# Patient Record
Sex: Female | Born: 1935
Health system: Southern US, Community
[De-identification: ages and names within clinical notes are randomized; demographics above are authoritative.]

## PROBLEM LIST (undated history)

## (undated) DIAGNOSIS — J449 Chronic obstructive pulmonary disease, unspecified: Secondary | ICD-10-CM

## (undated) DIAGNOSIS — T7840XA Allergy, unspecified, initial encounter: Secondary | ICD-10-CM

## (undated) DIAGNOSIS — Z8701 Personal history of pneumonia (recurrent): Secondary | ICD-10-CM

## (undated) DIAGNOSIS — R05 Cough: Secondary | ICD-10-CM

## (undated) DIAGNOSIS — M1990 Unspecified osteoarthritis, unspecified site: Secondary | ICD-10-CM

## (undated) DIAGNOSIS — Z923 Personal history of irradiation: Secondary | ICD-10-CM

## (undated) DIAGNOSIS — N12 Tubulo-interstitial nephritis, not specified as acute or chronic: Secondary | ICD-10-CM

## (undated) DIAGNOSIS — F0390 Unspecified dementia without behavioral disturbance: Secondary | ICD-10-CM

## (undated) DIAGNOSIS — E785 Hyperlipidemia, unspecified: Secondary | ICD-10-CM

## (undated) DIAGNOSIS — C3490 Malignant neoplasm of unspecified part of unspecified bronchus or lung: Secondary | ICD-10-CM

## (undated) DIAGNOSIS — R059 Cough, unspecified: Secondary | ICD-10-CM

## (undated) DIAGNOSIS — R011 Cardiac murmur, unspecified: Secondary | ICD-10-CM

## (undated) HISTORY — PX: COLONOSCOPY: SHX174

## (undated) HISTORY — PX: DILATION AND CURETTAGE OF UTERUS: SHX78

## (undated) HISTORY — DX: Personal history of irradiation: Z92.3

## (undated) HISTORY — PX: CATARACT EXTRACTION: SUR2

## (undated) HISTORY — PX: TONSILLECTOMY: SUR1361

## (undated) HISTORY — DX: Allergy, unspecified, initial encounter: T78.40XA

## (undated) HISTORY — DX: Hyperlipidemia, unspecified: E78.5

## (undated) HISTORY — DX: Chronic obstructive pulmonary disease, unspecified: J44.9

---

## 1998-04-13 ENCOUNTER — Ambulatory Visit (HOSPITAL_COMMUNITY): Admission: RE | Admit: 1998-04-13 | Discharge: 1998-04-13 | Payer: Self-pay | Admitting: Specialist

## 1998-11-03 ENCOUNTER — Other Ambulatory Visit: Admission: RE | Admit: 1998-11-03 | Discharge: 1998-11-03 | Payer: Self-pay | Admitting: Internal Medicine

## 1999-08-09 ENCOUNTER — Encounter: Admission: RE | Admit: 1999-08-09 | Discharge: 1999-08-09 | Payer: Self-pay | Admitting: Internal Medicine

## 1999-08-09 ENCOUNTER — Encounter: Payer: Self-pay | Admitting: Internal Medicine

## 1999-12-20 ENCOUNTER — Other Ambulatory Visit: Admission: RE | Admit: 1999-12-20 | Discharge: 1999-12-20 | Payer: Self-pay | Admitting: Internal Medicine

## 2000-09-19 ENCOUNTER — Encounter: Admission: RE | Admit: 2000-09-19 | Discharge: 2000-09-19 | Payer: Self-pay | Admitting: Internal Medicine

## 2000-09-19 ENCOUNTER — Encounter: Payer: Self-pay | Admitting: Internal Medicine

## 2001-01-09 ENCOUNTER — Other Ambulatory Visit: Admission: RE | Admit: 2001-01-09 | Discharge: 2001-01-09 | Payer: Self-pay | Admitting: Internal Medicine

## 2001-10-24 ENCOUNTER — Encounter: Admission: RE | Admit: 2001-10-24 | Discharge: 2001-10-24 | Payer: Self-pay | Admitting: Internal Medicine

## 2001-10-24 ENCOUNTER — Encounter: Payer: Self-pay | Admitting: Internal Medicine

## 2002-03-04 ENCOUNTER — Other Ambulatory Visit: Admission: RE | Admit: 2002-03-04 | Discharge: 2002-03-04 | Payer: Self-pay | Admitting: Obstetrics and Gynecology

## 2002-11-11 ENCOUNTER — Encounter: Admission: RE | Admit: 2002-11-11 | Discharge: 2002-11-11 | Payer: Self-pay | Admitting: Internal Medicine

## 2002-11-11 ENCOUNTER — Encounter: Payer: Self-pay | Admitting: Internal Medicine

## 2003-11-16 ENCOUNTER — Encounter: Admission: RE | Admit: 2003-11-16 | Discharge: 2003-11-16 | Payer: Self-pay | Admitting: Internal Medicine

## 2009-02-02 ENCOUNTER — Encounter (INDEPENDENT_AMBULATORY_CARE_PROVIDER_SITE_OTHER): Payer: Self-pay | Admitting: *Deleted

## 2009-11-16 ENCOUNTER — Ambulatory Visit (HOSPITAL_BASED_OUTPATIENT_CLINIC_OR_DEPARTMENT_OTHER): Admission: RE | Admit: 2009-11-16 | Discharge: 2009-11-16 | Payer: Self-pay | Admitting: Orthopedic Surgery

## 2010-12-05 LAB — POCT HEMOGLOBIN-HEMACUE: Hemoglobin: 13.5 g/dL (ref 12.0–15.0)

## 2011-09-28 DIAGNOSIS — H40009 Preglaucoma, unspecified, unspecified eye: Secondary | ICD-10-CM | POA: Diagnosis not present

## 2011-09-28 DIAGNOSIS — H40039 Anatomical narrow angle, unspecified eye: Secondary | ICD-10-CM | POA: Diagnosis not present

## 2011-10-10 DIAGNOSIS — J309 Allergic rhinitis, unspecified: Secondary | ICD-10-CM | POA: Diagnosis not present

## 2011-10-24 DIAGNOSIS — L821 Other seborrheic keratosis: Secondary | ICD-10-CM | POA: Diagnosis not present

## 2011-10-24 DIAGNOSIS — L819 Disorder of pigmentation, unspecified: Secondary | ICD-10-CM | POA: Diagnosis not present

## 2011-10-24 DIAGNOSIS — L578 Other skin changes due to chronic exposure to nonionizing radiation: Secondary | ICD-10-CM | POA: Diagnosis not present

## 2011-11-17 DIAGNOSIS — J309 Allergic rhinitis, unspecified: Secondary | ICD-10-CM | POA: Diagnosis not present

## 2011-12-28 ENCOUNTER — Encounter: Payer: Self-pay | Admitting: Gastroenterology

## 2012-01-03 DIAGNOSIS — J309 Allergic rhinitis, unspecified: Secondary | ICD-10-CM | POA: Diagnosis not present

## 2012-01-15 ENCOUNTER — Encounter: Payer: Self-pay | Admitting: Gastroenterology

## 2012-02-09 DIAGNOSIS — J309 Allergic rhinitis, unspecified: Secondary | ICD-10-CM | POA: Diagnosis not present

## 2012-03-05 ENCOUNTER — Ambulatory Visit (AMBULATORY_SURGERY_CENTER): Payer: Medicare Other | Admitting: *Deleted

## 2012-03-05 VITALS — Ht 67.0 in | Wt 184.0 lb

## 2012-03-05 DIAGNOSIS — Z1211 Encounter for screening for malignant neoplasm of colon: Secondary | ICD-10-CM

## 2012-03-05 MED ORDER — MOVIPREP 100 G PO SOLR: ORAL | Status: DC

## 2012-03-26 ENCOUNTER — Encounter: Payer: Self-pay | Admitting: Gastroenterology

## 2012-03-26 ENCOUNTER — Ambulatory Visit (AMBULATORY_SURGERY_CENTER): Payer: Medicare Other | Admitting: Gastroenterology

## 2012-03-26 VITALS — BP 149/73 | HR 75 | Temp 96.6°F | Resp 20 | Ht 67.0 in | Wt 184.0 lb

## 2012-03-26 DIAGNOSIS — L851 Acquired keratosis [keratoderma] palmaris et plantaris: Secondary | ICD-10-CM | POA: Diagnosis not present

## 2012-03-26 DIAGNOSIS — Z1211 Encounter for screening for malignant neoplasm of colon: Secondary | ICD-10-CM

## 2012-03-26 DIAGNOSIS — D126 Benign neoplasm of colon, unspecified: Secondary | ICD-10-CM | POA: Diagnosis not present

## 2012-03-26 DIAGNOSIS — F411 Generalized anxiety disorder: Secondary | ICD-10-CM | POA: Diagnosis not present

## 2012-03-26 MED ORDER — SODIUM CHLORIDE 0.9 % IV SOLN: 500.0000 mL | INTRAVENOUS | Status: DC

## 2012-03-26 NOTE — Progress Notes (Signed)
Patient did not experience any of the following events: a burn prior to discharge; a fall within the facility; wrong site/side/patient/procedure/implant event; or a hospital transfer or hospital admission upon discharge from the facility. (G8907) Patient did not have preoperative order for IV antibiotic SSI prophylaxis. (G8918)  

## 2012-03-26 NOTE — Op Note (Signed)
[facility] [address] [place], [place]  [address]  COLONOSCOPY PROCEDURE REPORT  PATIENT:  Elizabeth, Humphrey  MR#:  [id number] BIRTHDATE:  Jun 03, 1934, 78 yrs. old  GENDER:  female ENDOSCOPIST:  Judie Petit T. Russella Dar, MD, [facility]  PROCEDURE DATE:  03/26/2012 PROCEDURE:  Colonoscopy with biopsy and snare polypectomy ASA CLASS:  Class II INDICATIONS:  1) Routine Risk Screening MEDICATIONS:   MAC sedation, administered by CRNA, propofol (Diprivan) 120 mg IV DESCRIPTION OF PROCEDURE:   After the risks benefits and alternatives of the procedure were thoroughly explained, informed consent was obtained.  Digital rectal exam was performed and revealed no abnormalities.   The LB CF-[id number] endoscope was introduced through the anus and advanced to the cecum, which was identified by both the appendix and ileocecal valve, without limitations.  The quality of the prep was good, using MoviPrep. The instrument was then slowly withdrawn as the colon was fully examined. <<PROCEDUREIMAGES>> FINDINGS:  Three polyps were found in the mid transverse colon. They were 4 - 7 mm in size. Polyps were snared without cautery. Retrieval was successful. snare polyp  Otherwise normal colonoscopy without other polyps, masses, vascular ectasias, or inflammatory changes.  Retroflexed views in the rectum revealed internal hemorrhoids  and a 1 cm hypertrophied anal papillae. Biopsies obtained and sent to path.  The time to cecum =  6.33 minutes. The scope was then withdrawn (time =  13.33  min) from the patient and the procedure completed.  COMPLICATIONS:  None  ENDOSCOPIC IMPRESSION: 1) Three polyps in the mid transverse colon 2) Internal hemorrhoids 3) Hypertrophied anal papillae  RECOMMENDATIONS: 1) Await pathology results 2) Given your age, you will not need another colonoscopy for colon cancer screening or polyp surveillance. These types of tests usually stop around the age 83.  Venita Lick. Russella Dar, MD, [facility]  CC:  Geoffry Paradise, MD  n. Rosalie DoctorVenita Lick. Maleni Seyer at 03/26/2012 09:03 AM  Beryle Quant, [phone]

## 2012-03-26 NOTE — Patient Instructions (Addendum)
YOU HAD AN ENDOSCOPIC PROCEDURE TODAY AT THE [facility]: Refer to the procedure report that was given to you for any specific questions about what was found during the examination.  If the procedure report does not answer your questions, please call your gastroenterologist to clarify.  If you requested that your care partner not be given the details of your procedure findings, then the procedure report has been included in a sealed envelope for you to review at your convenience later.  YOU SHOULD EXPECT: Some feelings of bloating in the abdomen. Passage of more gas than usual.  Walking can help get rid of the air that was put into your GI tract during the procedure and reduce the bloating. If you had a lower endoscopy (such as a colonoscopy or flexible sigmoidoscopy) you may notice spotting of blood in your stool or on the toilet paper. If you underwent a bowel prep for your procedure, then you may not have a normal bowel movement for a few days.  DIET: Your first meal following the procedure should be a light meal and then it is ok to progress to your normal diet.  A half-sandwich or bowl of soup is an example of a good first meal.  Heavy or fried foods are harder to digest and may make you feel nauseous or bloated.  Likewise meals heavy in dairy and vegetables can cause extra gas to form and this can also increase the bloating.  Drink plenty of fluids but you should avoid alcoholic beverages for 24 hours.  ACTIVITY: Your care partner should take you home directly after the procedure.  You should plan to take it easy, moving slowly for the rest of the day.  You can resume normal activity the day after the procedure however you should NOT DRIVE or use heavy machinery for 24 hours (because of the sedation medicines used during the test).    SYMPTOMS TO REPORT IMMEDIATELY: A gastroenterologist can be reached at any hour.  During normal business hours, 8:30 AM to 5:00 PM Monday through Friday,  call [phone].  After hours and on weekends, please call the GI answering service at [phone] who will take a message and have the physician on call contact you.   Following lower endoscopy (colonoscopy or flexible sigmoidoscopy):  Excessive amounts of blood in the stool  Significant tenderness or worsening of abdominal pains  Swelling of the abdomen that is new, acute  Fever of 100F or higher  Following upper endoscopy (EGD)  Vomiting of blood or coffee ground material  New chest pain or pain under the shoulder blades  Painful or persistently difficult swallowing  New shortness of breath  Fever of 100F or higher  Black, tarry-looking stools  FOLLOW UP: If any biopsies were taken you will be contacted by phone or by letter within the next 1-3 weeks.  Call your gastroenterologist if you have not heard about the biopsies in 3 weeks.  Our staff will call the home number listed on your records the next business day following your procedure to check on you and address any questions or concerns that you may have at that time regarding the information given to you following your procedure. This is a courtesy call and so if there is no answer at the home number and we have not heard from you through the emergency physician on call, we will assume that you have returned to your regular daily activities without incident.  SIGNATURES/CONFIDENTIALITY: You and/or your care   partner have signed paperwork which will be entered into your electronic medical record.  These signatures attest to the fact that that the information above on your After Visit Summary has been reviewed and is understood.  Full responsibility of the confidentiality of this discharge information lies with you and/or your care-partner.   Handout on polyps 

## 2012-03-27 ENCOUNTER — Telehealth: Payer: Self-pay | Admitting: *Deleted

## 2012-03-27 DIAGNOSIS — T63461A Toxic effect of venom of wasps, accidental (unintentional), initial encounter: Secondary | ICD-10-CM | POA: Diagnosis not present

## 2012-03-27 DIAGNOSIS — T6391XA Toxic effect of contact with unspecified venomous animal, accidental (unintentional), initial encounter: Secondary | ICD-10-CM | POA: Diagnosis not present

## 2012-03-27 NOTE — Telephone Encounter (Signed)
  Follow up Call-  Call back number 03/26/2012  Post procedure Call Back phone  # [phone]  Permission to leave phone message Yes     Patient questions:  Do you have a fever, pain , or abdominal swelling? no Pain Score  0 *  Have you tolerated food without any problems? yes  Have you been able to return to your normal activities? yes  Do you have any questions about your discharge instructions: Diet   no Medications  no Follow up visit  no  Do you have questions or concerns about your Care? no  Actions: * If pain score is 4 or above: No action needed, pain <4.

## 2012-04-01 ENCOUNTER — Encounter: Payer: Self-pay | Admitting: Gastroenterology

## 2012-04-23 DIAGNOSIS — L578 Other skin changes due to chronic exposure to nonionizing radiation: Secondary | ICD-10-CM | POA: Diagnosis not present

## 2012-04-23 DIAGNOSIS — L702 Acne varioliformis: Secondary | ICD-10-CM | POA: Diagnosis not present

## 2012-04-23 DIAGNOSIS — L821 Other seborrheic keratosis: Secondary | ICD-10-CM | POA: Diagnosis not present

## 2012-04-23 DIAGNOSIS — L570 Actinic keratosis: Secondary | ICD-10-CM | POA: Diagnosis not present

## 2012-04-29 DIAGNOSIS — J309 Allergic rhinitis, unspecified: Secondary | ICD-10-CM | POA: Diagnosis not present

## 2012-05-08 DIAGNOSIS — J309 Allergic rhinitis, unspecified: Secondary | ICD-10-CM | POA: Diagnosis not present

## 2012-05-23 DIAGNOSIS — H268 Other specified cataract: Secondary | ICD-10-CM | POA: Diagnosis not present

## 2012-05-23 DIAGNOSIS — H259 Unspecified age-related cataract: Secondary | ICD-10-CM | POA: Diagnosis not present

## 2012-05-23 DIAGNOSIS — H40249 Residual stage of angle-closure glaucoma, unspecified eye: Secondary | ICD-10-CM | POA: Diagnosis not present

## 2012-06-11 DIAGNOSIS — H25019 Cortical age-related cataract, unspecified eye: Secondary | ICD-10-CM | POA: Diagnosis not present

## 2012-06-11 DIAGNOSIS — H2510 Age-related nuclear cataract, unspecified eye: Secondary | ICD-10-CM | POA: Diagnosis not present

## 2012-06-11 DIAGNOSIS — H268 Other specified cataract: Secondary | ICD-10-CM | POA: Diagnosis not present

## 2012-06-11 DIAGNOSIS — H25049 Posterior subcapsular polar age-related cataract, unspecified eye: Secondary | ICD-10-CM | POA: Diagnosis not present

## 2012-06-11 DIAGNOSIS — Z9889 Other specified postprocedural states: Secondary | ICD-10-CM | POA: Diagnosis not present

## 2012-06-11 DIAGNOSIS — H5700 Unspecified anomaly of pupillary function: Secondary | ICD-10-CM | POA: Diagnosis not present

## 2012-06-18 DIAGNOSIS — R82998 Other abnormal findings in urine: Secondary | ICD-10-CM | POA: Diagnosis not present

## 2012-06-18 DIAGNOSIS — Z1212 Encounter for screening for malignant neoplasm of rectum: Secondary | ICD-10-CM | POA: Diagnosis not present

## 2012-06-18 DIAGNOSIS — E785 Hyperlipidemia, unspecified: Secondary | ICD-10-CM | POA: Diagnosis not present

## 2012-06-18 DIAGNOSIS — Z79899 Other long term (current) drug therapy: Secondary | ICD-10-CM | POA: Diagnosis not present

## 2012-06-18 DIAGNOSIS — R7301 Impaired fasting glucose: Secondary | ICD-10-CM | POA: Diagnosis not present

## 2012-06-20 DIAGNOSIS — J309 Allergic rhinitis, unspecified: Secondary | ICD-10-CM | POA: Diagnosis not present

## 2012-06-26 DIAGNOSIS — E785 Hyperlipidemia, unspecified: Secondary | ICD-10-CM | POA: Diagnosis not present

## 2012-06-26 DIAGNOSIS — Z0000 Encounter for general adult medical examination without abnormal findings: Secondary | ICD-10-CM | POA: Diagnosis not present

## 2012-06-26 DIAGNOSIS — Z23 Encounter for immunization: Secondary | ICD-10-CM | POA: Diagnosis not present

## 2012-06-26 DIAGNOSIS — Z1331 Encounter for screening for depression: Secondary | ICD-10-CM | POA: Diagnosis not present

## 2012-06-26 DIAGNOSIS — M4800 Spinal stenosis, site unspecified: Secondary | ICD-10-CM | POA: Diagnosis not present

## 2012-06-26 DIAGNOSIS — R7301 Impaired fasting glucose: Secondary | ICD-10-CM | POA: Diagnosis not present

## 2012-06-27 ENCOUNTER — Other Ambulatory Visit: Payer: Self-pay | Admitting: Internal Medicine

## 2012-06-27 DIAGNOSIS — R42 Dizziness and giddiness: Secondary | ICD-10-CM

## 2012-06-27 DIAGNOSIS — R112 Nausea with vomiting, unspecified: Secondary | ICD-10-CM | POA: Diagnosis not present

## 2012-06-27 DIAGNOSIS — R7301 Impaired fasting glucose: Secondary | ICD-10-CM | POA: Diagnosis not present

## 2012-06-27 DIAGNOSIS — J309 Allergic rhinitis, unspecified: Secondary | ICD-10-CM | POA: Diagnosis not present

## 2012-06-27 DIAGNOSIS — H8110 Benign paroxysmal vertigo, unspecified ear: Secondary | ICD-10-CM | POA: Diagnosis not present

## 2012-06-28 ENCOUNTER — Inpatient Hospital Stay: Admission: RE | Admit: 2012-06-28 | Payer: Medicare Other | Source: Ambulatory Visit

## 2012-06-28 DIAGNOSIS — Z1212 Encounter for screening for malignant neoplasm of rectum: Secondary | ICD-10-CM | POA: Diagnosis not present

## 2012-07-29 DIAGNOSIS — J309 Allergic rhinitis, unspecified: Secondary | ICD-10-CM | POA: Diagnosis not present

## 2012-08-20 DIAGNOSIS — H25049 Posterior subcapsular polar age-related cataract, unspecified eye: Secondary | ICD-10-CM | POA: Diagnosis not present

## 2012-08-20 DIAGNOSIS — H25019 Cortical age-related cataract, unspecified eye: Secondary | ICD-10-CM | POA: Diagnosis not present

## 2012-08-20 DIAGNOSIS — H2510 Age-related nuclear cataract, unspecified eye: Secondary | ICD-10-CM | POA: Diagnosis not present

## 2012-08-20 DIAGNOSIS — H269 Unspecified cataract: Secondary | ICD-10-CM | POA: Diagnosis not present

## 2012-09-12 DIAGNOSIS — J309 Allergic rhinitis, unspecified: Secondary | ICD-10-CM | POA: Diagnosis not present

## 2012-09-17 DIAGNOSIS — M4800 Spinal stenosis, site unspecified: Secondary | ICD-10-CM | POA: Diagnosis not present

## 2012-09-24 DIAGNOSIS — M999 Biomechanical lesion, unspecified: Secondary | ICD-10-CM | POA: Diagnosis not present

## 2012-09-24 DIAGNOSIS — M25559 Pain in unspecified hip: Secondary | ICD-10-CM | POA: Diagnosis not present

## 2012-09-24 DIAGNOSIS — M5137 Other intervertebral disc degeneration, lumbosacral region: Secondary | ICD-10-CM | POA: Diagnosis not present

## 2012-09-25 DIAGNOSIS — M25559 Pain in unspecified hip: Secondary | ICD-10-CM | POA: Diagnosis not present

## 2012-09-25 DIAGNOSIS — M999 Biomechanical lesion, unspecified: Secondary | ICD-10-CM | POA: Diagnosis not present

## 2012-09-25 DIAGNOSIS — M5137 Other intervertebral disc degeneration, lumbosacral region: Secondary | ICD-10-CM | POA: Diagnosis not present

## 2012-09-26 DIAGNOSIS — M999 Biomechanical lesion, unspecified: Secondary | ICD-10-CM | POA: Diagnosis not present

## 2012-09-26 DIAGNOSIS — M25559 Pain in unspecified hip: Secondary | ICD-10-CM | POA: Diagnosis not present

## 2012-09-26 DIAGNOSIS — M5137 Other intervertebral disc degeneration, lumbosacral region: Secondary | ICD-10-CM | POA: Diagnosis not present

## 2012-09-30 DIAGNOSIS — M999 Biomechanical lesion, unspecified: Secondary | ICD-10-CM | POA: Diagnosis not present

## 2012-09-30 DIAGNOSIS — M25559 Pain in unspecified hip: Secondary | ICD-10-CM | POA: Diagnosis not present

## 2012-09-30 DIAGNOSIS — M5137 Other intervertebral disc degeneration, lumbosacral region: Secondary | ICD-10-CM | POA: Diagnosis not present

## 2012-10-01 DIAGNOSIS — M25559 Pain in unspecified hip: Secondary | ICD-10-CM | POA: Diagnosis not present

## 2012-10-01 DIAGNOSIS — M999 Biomechanical lesion, unspecified: Secondary | ICD-10-CM | POA: Diagnosis not present

## 2012-10-01 DIAGNOSIS — M5137 Other intervertebral disc degeneration, lumbosacral region: Secondary | ICD-10-CM | POA: Diagnosis not present

## 2012-10-03 DIAGNOSIS — M25559 Pain in unspecified hip: Secondary | ICD-10-CM | POA: Diagnosis not present

## 2012-10-03 DIAGNOSIS — M999 Biomechanical lesion, unspecified: Secondary | ICD-10-CM | POA: Diagnosis not present

## 2012-10-03 DIAGNOSIS — M5137 Other intervertebral disc degeneration, lumbosacral region: Secondary | ICD-10-CM | POA: Diagnosis not present

## 2012-10-07 DIAGNOSIS — M5137 Other intervertebral disc degeneration, lumbosacral region: Secondary | ICD-10-CM | POA: Diagnosis not present

## 2012-10-07 DIAGNOSIS — M999 Biomechanical lesion, unspecified: Secondary | ICD-10-CM | POA: Diagnosis not present

## 2012-10-07 DIAGNOSIS — M25559 Pain in unspecified hip: Secondary | ICD-10-CM | POA: Diagnosis not present

## 2012-10-08 DIAGNOSIS — M5137 Other intervertebral disc degeneration, lumbosacral region: Secondary | ICD-10-CM | POA: Diagnosis not present

## 2012-10-08 DIAGNOSIS — M25559 Pain in unspecified hip: Secondary | ICD-10-CM | POA: Diagnosis not present

## 2012-10-08 DIAGNOSIS — M999 Biomechanical lesion, unspecified: Secondary | ICD-10-CM | POA: Diagnosis not present

## 2012-10-10 DIAGNOSIS — M999 Biomechanical lesion, unspecified: Secondary | ICD-10-CM | POA: Diagnosis not present

## 2012-10-10 DIAGNOSIS — M5137 Other intervertebral disc degeneration, lumbosacral region: Secondary | ICD-10-CM | POA: Diagnosis not present

## 2012-10-10 DIAGNOSIS — M25559 Pain in unspecified hip: Secondary | ICD-10-CM | POA: Diagnosis not present

## 2012-10-14 DIAGNOSIS — M999 Biomechanical lesion, unspecified: Secondary | ICD-10-CM | POA: Diagnosis not present

## 2012-10-14 DIAGNOSIS — M25559 Pain in unspecified hip: Secondary | ICD-10-CM | POA: Diagnosis not present

## 2012-10-14 DIAGNOSIS — M5137 Other intervertebral disc degeneration, lumbosacral region: Secondary | ICD-10-CM | POA: Diagnosis not present

## 2012-10-15 DIAGNOSIS — M25559 Pain in unspecified hip: Secondary | ICD-10-CM | POA: Diagnosis not present

## 2012-10-15 DIAGNOSIS — M999 Biomechanical lesion, unspecified: Secondary | ICD-10-CM | POA: Diagnosis not present

## 2012-10-15 DIAGNOSIS — M5137 Other intervertebral disc degeneration, lumbosacral region: Secondary | ICD-10-CM | POA: Diagnosis not present

## 2012-10-17 DIAGNOSIS — M25559 Pain in unspecified hip: Secondary | ICD-10-CM | POA: Diagnosis not present

## 2012-10-17 DIAGNOSIS — M5137 Other intervertebral disc degeneration, lumbosacral region: Secondary | ICD-10-CM | POA: Diagnosis not present

## 2012-10-17 DIAGNOSIS — M999 Biomechanical lesion, unspecified: Secondary | ICD-10-CM | POA: Diagnosis not present

## 2012-10-21 DIAGNOSIS — R82998 Other abnormal findings in urine: Secondary | ICD-10-CM | POA: Diagnosis not present

## 2012-10-21 DIAGNOSIS — R300 Dysuria: Secondary | ICD-10-CM | POA: Diagnosis not present

## 2012-10-22 DIAGNOSIS — T63461A Toxic effect of venom of wasps, accidental (unintentional), initial encounter: Secondary | ICD-10-CM | POA: Diagnosis not present

## 2012-10-22 DIAGNOSIS — T6391XA Toxic effect of contact with unspecified venomous animal, accidental (unintentional), initial encounter: Secondary | ICD-10-CM | POA: Diagnosis not present

## 2012-10-28 DIAGNOSIS — L819 Disorder of pigmentation, unspecified: Secondary | ICD-10-CM | POA: Diagnosis not present

## 2012-10-28 DIAGNOSIS — L578 Other skin changes due to chronic exposure to nonionizing radiation: Secondary | ICD-10-CM | POA: Diagnosis not present

## 2012-10-28 DIAGNOSIS — L821 Other seborrheic keratosis: Secondary | ICD-10-CM | POA: Diagnosis not present

## 2012-10-28 DIAGNOSIS — D1801 Hemangioma of skin and subcutaneous tissue: Secondary | ICD-10-CM | POA: Diagnosis not present

## 2012-10-28 DIAGNOSIS — L570 Actinic keratosis: Secondary | ICD-10-CM | POA: Diagnosis not present

## 2012-10-29 DIAGNOSIS — M25559 Pain in unspecified hip: Secondary | ICD-10-CM | POA: Diagnosis not present

## 2012-10-29 DIAGNOSIS — M999 Biomechanical lesion, unspecified: Secondary | ICD-10-CM | POA: Diagnosis not present

## 2012-10-29 DIAGNOSIS — M5137 Other intervertebral disc degeneration, lumbosacral region: Secondary | ICD-10-CM | POA: Diagnosis not present

## 2012-10-31 DIAGNOSIS — M999 Biomechanical lesion, unspecified: Secondary | ICD-10-CM | POA: Diagnosis not present

## 2012-10-31 DIAGNOSIS — M5137 Other intervertebral disc degeneration, lumbosacral region: Secondary | ICD-10-CM | POA: Diagnosis not present

## 2012-10-31 DIAGNOSIS — M25559 Pain in unspecified hip: Secondary | ICD-10-CM | POA: Diagnosis not present

## 2012-12-11 DIAGNOSIS — J309 Allergic rhinitis, unspecified: Secondary | ICD-10-CM | POA: Diagnosis not present

## 2013-01-22 DIAGNOSIS — T63461A Toxic effect of venom of wasps, accidental (unintentional), initial encounter: Secondary | ICD-10-CM | POA: Diagnosis not present

## 2013-01-22 DIAGNOSIS — T6391XA Toxic effect of contact with unspecified venomous animal, accidental (unintentional), initial encounter: Secondary | ICD-10-CM | POA: Diagnosis not present

## 2013-02-21 DIAGNOSIS — L820 Inflamed seborrheic keratosis: Secondary | ICD-10-CM | POA: Diagnosis not present

## 2013-02-21 DIAGNOSIS — L578 Other skin changes due to chronic exposure to nonionizing radiation: Secondary | ICD-10-CM | POA: Diagnosis not present

## 2013-02-21 DIAGNOSIS — Z85828 Personal history of other malignant neoplasm of skin: Secondary | ICD-10-CM | POA: Diagnosis not present

## 2013-02-21 DIAGNOSIS — Z85820 Personal history of malignant melanoma of skin: Secondary | ICD-10-CM | POA: Diagnosis not present

## 2013-02-21 DIAGNOSIS — L819 Disorder of pigmentation, unspecified: Secondary | ICD-10-CM | POA: Diagnosis not present

## 2013-03-05 DIAGNOSIS — T6391XA Toxic effect of contact with unspecified venomous animal, accidental (unintentional), initial encounter: Secondary | ICD-10-CM | POA: Diagnosis not present

## 2013-03-05 DIAGNOSIS — T63461A Toxic effect of venom of wasps, accidental (unintentional), initial encounter: Secondary | ICD-10-CM | POA: Diagnosis not present

## 2013-04-17 DIAGNOSIS — T6391XA Toxic effect of contact with unspecified venomous animal, accidental (unintentional), initial encounter: Secondary | ICD-10-CM | POA: Diagnosis not present

## 2013-04-17 DIAGNOSIS — T63461A Toxic effect of venom of wasps, accidental (unintentional), initial encounter: Secondary | ICD-10-CM | POA: Diagnosis not present

## 2013-05-27 DIAGNOSIS — J309 Allergic rhinitis, unspecified: Secondary | ICD-10-CM | POA: Diagnosis not present

## 2013-06-18 DIAGNOSIS — T6391XA Toxic effect of contact with unspecified venomous animal, accidental (unintentional), initial encounter: Secondary | ICD-10-CM | POA: Diagnosis not present

## 2013-06-18 DIAGNOSIS — J309 Allergic rhinitis, unspecified: Secondary | ICD-10-CM | POA: Diagnosis not present

## 2013-06-25 DIAGNOSIS — E785 Hyperlipidemia, unspecified: Secondary | ICD-10-CM | POA: Diagnosis not present

## 2013-06-25 DIAGNOSIS — R7301 Impaired fasting glucose: Secondary | ICD-10-CM | POA: Diagnosis not present

## 2013-06-25 DIAGNOSIS — R809 Proteinuria, unspecified: Secondary | ICD-10-CM | POA: Diagnosis not present

## 2013-07-02 DIAGNOSIS — M4800 Spinal stenosis, site unspecified: Secondary | ICD-10-CM | POA: Diagnosis not present

## 2013-07-02 DIAGNOSIS — M1990 Unspecified osteoarthritis, unspecified site: Secondary | ICD-10-CM | POA: Diagnosis not present

## 2013-07-02 DIAGNOSIS — E785 Hyperlipidemia, unspecified: Secondary | ICD-10-CM | POA: Diagnosis not present

## 2013-07-02 DIAGNOSIS — Z0000 Encounter for general adult medical examination without abnormal findings: Secondary | ICD-10-CM | POA: Diagnosis not present

## 2013-07-02 DIAGNOSIS — R7301 Impaired fasting glucose: Secondary | ICD-10-CM | POA: Diagnosis not present

## 2013-07-02 DIAGNOSIS — Z1331 Encounter for screening for depression: Secondary | ICD-10-CM | POA: Diagnosis not present

## 2013-07-02 DIAGNOSIS — J984 Other disorders of lung: Secondary | ICD-10-CM | POA: Diagnosis not present

## 2013-07-02 DIAGNOSIS — Z6830 Body mass index (BMI) 30.0-30.9, adult: Secondary | ICD-10-CM | POA: Diagnosis not present

## 2013-07-02 DIAGNOSIS — Z23 Encounter for immunization: Secondary | ICD-10-CM | POA: Diagnosis not present

## 2013-07-03 ENCOUNTER — Ambulatory Visit (INDEPENDENT_AMBULATORY_CARE_PROVIDER_SITE_OTHER): Payer: Medicare Other | Admitting: General Surgery

## 2013-07-03 ENCOUNTER — Encounter (INDEPENDENT_AMBULATORY_CARE_PROVIDER_SITE_OTHER): Payer: Self-pay

## 2013-07-03 ENCOUNTER — Encounter (INDEPENDENT_AMBULATORY_CARE_PROVIDER_SITE_OTHER): Payer: Self-pay | Admitting: General Surgery

## 2013-07-03 VITALS — BP 140/74 | HR 96 | Temp 98.8°F | Resp 16 | Ht 67.0 in | Wt 189.8 lb

## 2013-07-03 DIAGNOSIS — K645 Perianal venous thrombosis: Secondary | ICD-10-CM | POA: Diagnosis not present

## 2013-07-03 NOTE — Progress Notes (Signed)
Patient ID: Elizabeth Humphrey, female   DOB: 1934-08-17, 79 y.o.   MRN: [id number]  Chief Complaint  Patient presents with  . New Evaluation    eval hems    HPI Elizabeth Humphrey is a 79 y.o. female. This patient is referred by Dr. Jacky Kindle for evaluation of hemorrhoids. She has had a large hemorrhoid for about 40 years and she has just continually push this back in after each bowel movement. She says it really doesn't cause any symptoms but it bulges after each time she moves her bowels. She says it is not bleed and usually doesn't have any pain but it has been sore over the last week to she applies mupiricin without any relief. She does not take any fiber supplements and occasionally takes stool softeners but these do not provide any relief. She says that she normally moves her bowels about every other day and this has been normal for her. She denies a family history colon cancer. She had a colonoscopy last summer of by Dr. Russella Dar and he noted the hemorrhoids but she tells me this was otherwise negative. HPI  Past Medical History  Diagnosis Date  . Allergy     bee stings/anaphylaxis  . Hyperlipidemia     Past Surgical History  Procedure Laterality Date  . Dilation and curettage of uterus      Family History  Problem Relation Age of Onset  . Heart disease Mother   . Heart disease Father     Social History History  Substance Use Topics  . Smoking status: Former Smoker    Types: Cigarettes    Quit date: 07/03/2005  . Smokeless tobacco: Never Used  . Alcohol Use: 3.5 oz/week    7 drink(s) per week    Allergies  Allergen Reactions  . Neosporin [Neomycin-Bacitracin Zn-Polymyx] Rash    Current Outpatient Prescriptions  Medication Sig Dispense Refill  . aspirin 81 MG tablet Take 81 mg by mouth daily.      . mupirocin ointment (BACTROBAN) 2 % Apply 1 application topically as needed. For cuts and scrapes      . simvastatin (ZOCOR) 40 MG tablet Take 40 mg by mouth at bedtime.       . traMADol (ULTRAM) 50 MG tablet Take 1 tablet by mouth Twice daily.      [name] triamcinolone cream (KENALOG) 0.1 % Apply 1 application topically as needed. Poison and bug bites       No current facility-administered medications for this visit.    Review of Systems Review of Systems All other review of systems negative or noncontributory except as stated in the HPI  Blood pressure 140/74, pulse 96, temperature 98.8 F (37.1 C), temperature source Temporal, resp. rate 16, height 5\' 7"  (1.702 m), weight 189 lb 12.8 oz (86.093 kg).  Physical Exam Physical Exam Physical Exam  Nursing note and vitals reviewed. Constitutional: She is oriented to person, place, and time. She appears well-developed and well-nourished. No distress.  HENT:  Head: Normocephalic and atraumatic.  Mouth/Throat: No oropharyngeal exudate.  Eyes: Conjunctivae and EOM are normal. Pupils are equal, round, and reactive to light. Right eye exhibits no discharge. Left eye exhibits no discharge. No scleral icterus.  Neck: Normal range of motion. Neck supple. No tracheal deviation present.  Cardiovascular: Normal rate, regular rhythm, normal heart sounds and intact distal pulses.   Pulmonary/Chest: Effort normal and breath sounds normal. No stridor. No respiratory distress. She has no wheezes.  Abdominal: Soft. Bowel sounds are normal.  She exhibits no distension and no mass. There is no tenderness. There is no rebound and no guarding.  Rectal: large 2cm pedunculated hemorrhoid at left lateral column, reducible.  There is no evidence of bleeding or adenomatous changes.  She also has a portion of purple and thrombosed hemorrhoids near the base of this larger hemorrhoid. The right side is pretty normal appearing.  DRE is negative for internal masses. Musculoskeletal: Normal range of motion. She exhibits no edema and no tenderness.  Neurological: She is alert and oriented to person, place, and time.  Skin: Skin is warm and dry. No  rash noted. She is not diaphoretic. No erythema. No pallor.  Psychiatric: She has a normal mood and affect. Her behavior is normal. Judgment and thought content normal.    Data Reviewed cscope  Assessment    Hemorrhoids She has a large left lateral column hemorrhoid with some area of thrombosis associated with this. This is reducible manually and appears to be pretty asymptomatic. She says that this has been relatively unchanged for the last 40 years. I explained to her that this is likely a hemorrhoid but we could not completely rule out malignancy given the size without biopsy or excision.  I think that the chronicity of the problem would lean against malignancy since she says that this has been present for 40 years and mostly unchanged. She had a colonoscopy as well as which did not demonstrate any significant abnormalities other than this hemorrhoid.  I did discuss with her the option for hemorrhoidectomy for treatment and in order to rule out malignancy since this is such a large mass.  Again, I do not think that it is a cancer, but I did recommend excisional bx/hemorrhoid for treatment and to rule this out.  She would like to wait until after the New Year and in the meantime I recommended high fiber diet, 20-30 gms/day and increase water intake.  She will continue conservative treatment for now and if she would like to proceed with hemorrhoidectomy, she will call us back.    Plan    Increase fiber to 20-30gms/day Increase water intake Follow up PRN if excision desired         Elizabeth Humphrey DAVID 07/03/2013, 2:14 PM

## 2013-07-11 DIAGNOSIS — R222 Localized swelling, mass and lump, trunk: Secondary | ICD-10-CM | POA: Diagnosis not present

## 2013-07-17 ENCOUNTER — Other Ambulatory Visit (INDEPENDENT_AMBULATORY_CARE_PROVIDER_SITE_OTHER): Payer: Medicare Other

## 2013-07-17 ENCOUNTER — Encounter: Payer: Self-pay | Admitting: Pulmonary Disease

## 2013-07-17 ENCOUNTER — Ambulatory Visit (INDEPENDENT_AMBULATORY_CARE_PROVIDER_SITE_OTHER): Payer: Medicare Other | Admitting: Pulmonary Disease

## 2013-07-17 VITALS — BP 122/78 | HR 69 | Temp 97.9°F | Ht 66.5 in | Wt 191.0 lb

## 2013-07-17 DIAGNOSIS — R918 Other nonspecific abnormal finding of lung field: Secondary | ICD-10-CM

## 2013-07-17 DIAGNOSIS — R059 Cough, unspecified: Secondary | ICD-10-CM

## 2013-07-17 DIAGNOSIS — Z01811 Encounter for preprocedural respiratory examination: Secondary | ICD-10-CM

## 2013-07-17 DIAGNOSIS — R05 Cough: Secondary | ICD-10-CM | POA: Diagnosis not present

## 2013-07-17 DIAGNOSIS — R222 Localized swelling, mass and lump, trunk: Secondary | ICD-10-CM

## 2013-07-17 DIAGNOSIS — Z9189 Other specified personal risk factors, not elsewhere classified: Secondary | ICD-10-CM

## 2013-07-17 DIAGNOSIS — Z789 Other specified health status: Secondary | ICD-10-CM

## 2013-07-17 DIAGNOSIS — D499 Neoplasm of unspecified behavior of unspecified site: Secondary | ICD-10-CM

## 2013-07-17 LAB — CBC WITH DIFFERENTIAL/PLATELET
Basophils Relative: 0.5 % (ref 0.0–3.0)
Eosinophils Relative: 2.3 % (ref 0.0–5.0)
Lymphocytes Relative: 32 % (ref 12.0–46.0)
MCV: 93.1 fl (ref 78.0–100.0)
Monocytes Relative: 11.4 % (ref 3.0–12.0)
Neutrophils Relative %: 53.8 % (ref 43.0–77.0)
Platelets: 189 10*3/uL (ref 150.0–400.0)
RBC: 4.07 Mil/uL (ref 3.87–5.11)
WBC: 6.5 10*3/uL (ref 4.5–10.5)

## 2013-07-17 LAB — PROTIME-INR: INR: 1.1 ratio — ABNORMAL HIGH (ref 0.8–1.0)

## 2013-07-17 NOTE — Progress Notes (Signed)
Subjective:    Patient ID: Elizabeth Humphrey, female    DOB: 11/07/1933, 79 y.o.   MRN: [id number]  HPI This is a very pleasant 79 y/o female who came to our clnic today for evaluation of a lung mass. She recently had bronchitis which has been clearing up and she ended up getting a CXR either for evaluation of this or as part of a yearly physical.   The chest x-ray showed a nodule so a follow up CT scan was ordered. This showed a RLL lung mass with mediastinal lymphadenopathy. She tells me that she has been feeling well aside from the cough productive of clear sputum. In general this is slowly clearing up over the last several weeks. Her weight has been stable, she does not have chest pain, and she has not coughed up any blood. She has not had fevers or chills. She previously smoked one half pack of cigarettes daily for 50 years and quit approximately 9 years ago.   Past Medical History  Diagnosis Date  . Allergy     bee stings/anaphylaxis  . Hyperlipidemia      Family History  Problem Relation Age of Onset  . Heart disease Mother   . Heart disease Father      History   Social History  . Marital Status: Married    Spouse Name: N/A    Number of Children: N/A  . Years of Education: N/A   Occupational History  . Not on file.   Social History Main Topics  . Smoking status: Former Smoker -- 0.50 packs/day for 50 years    Types: Cigarettes    Quit date: 07/03/2005  . Smokeless tobacco: Never Used  . Alcohol Use: 3.5 oz/week    7 drink(s) per week  . Drug Use: No  . Sexual Activity: Not on file   Other Topics Concern  . Not on file   Social History Narrative  . No narrative on file     Allergies  Allergen Reactions  . Neosporin [Neomycin-Bacitracin Zn-Polymyx] Rash     Outpatient Prescriptions Prior to Visit  Medication Sig Dispense Refill  . aspirin 81 MG tablet Take 81 mg by mouth daily.      . mupirocin ointment (BACTROBAN) 2 % Apply 1 application topically as  needed. For cuts and scrapes      . simvastatin (ZOCOR) 40 MG tablet Take 40 mg by mouth at bedtime.      . traMADol (ULTRAM) 50 MG tablet Take 1 tablet by mouth Twice daily.      [name] triamcinolone cream (KENALOG) 0.1 % Apply 1 application topically as needed. Poison and bug bites       No facility-administered medications prior to visit.      Review of Systems  Constitutional: Negative for fever and chills.  HENT: Positive for congestion. Negative for nosebleeds, postnasal drip, rhinorrhea and sinus pressure.   Eyes: Negative for pain, redness and itching.  Respiratory: Positive for cough. Negative for choking, chest tightness, shortness of breath and wheezing.   Cardiovascular: Negative for chest pain.  Gastrointestinal: Negative for abdominal pain, diarrhea and constipation.  Endocrine: Negative for cold intolerance and heat intolerance.  Genitourinary: Negative for frequency and difficulty urinating.  Musculoskeletal: Positive for arthralgias and back pain.  Skin: Negative for color change and rash.  Allergic/Immunologic: Negative for immunocompromised state.  Neurological: Negative for dizziness, speech difficulty and headaches.  Hematological: Negative for adenopathy.  Psychiatric/Behavioral: Negative for behavioral problems, confusion, self-injury and dysphoric  mood. The patient is not nervous/anxious.        Objective:   Physical Exam  Filed Vitals:   07/17/13 1059  BP: 122/78  Pulse: 69  Temp: 97.9 F (36.6 C)  TempSrc: Oral  Height: 5' 6.5" (1.689 m)  Weight: 191 lb (86.637 kg)  SpO2: 97%  RA  Gen: well appearing, no acute distress HEENT: NCAT, PERRL, EOMi, OP clear, neck supple without masses, no palpable lymphadenopathy PULM: CTA B CV: RRR, systolic murmur noted, no JVD AB: BS+, soft, nontender, no hsm Ext: warm, no edema, no clubbing, no cyanosis Derm: no rash or skin breakdown Neuro: A&Ox4, CN II-XII intact, strength 5/5 in all 4 extremities  07/11/2013  CT chest, report is not available so this is my interpretation> There is a mass in the right lower lobe which appears to be 2.3 cm in size with associated right hilar and mediastinal lymphadenopathy. Most prominent is a #7 lymph node which is quite enlarged as well as a slightly enlarged 4R lymph node     Assessment & Plan:   Lung mass I am very concerned about the size of this lesion and the mediastinal lymphadenopathy.  Given her smoking history this is very concerning for bronchogenic carcinoma like adenocarcinoma or squamous cell carcinoma.  The ddx includes benign possibilities, but these are less likely.  I need to obtain the final report from the CT chest, as at this point I am only able to see the images.  Plan: -get full CT chest report -PET/CT to see if extrathoracic sites are FDG-avid and amenable to biopsy -plan EBUS guided FNA of subcarinal lymph node within the week -full PFT -CBC and PT/INR -f/u 4-6 weeks  Cough This appears to be resolving bronchitis.  We checked spirometry today to look for COPD but it was not acceptable by ATS criteria.  The flow volume loop was consistent with obstruction, but the FEV1 volume measured was greater than 80% predicted.  Plan -continue anti-tussive therapy -full PFT   Updated Medication List Outpatient Encounter Prescriptions as of 07/17/2013  Medication Sig  . aspirin 81 MG tablet Take 81 mg by mouth daily.  [name] HYDROcodone-homatropine (HYCODAN) 5-1.5 MG/5ML syrup Take 5 mLs by mouth 2 (two) times daily.  . mupirocin ointment (BACTROBAN) 2 % Apply 1 application topically as needed. For cuts and scrapes  . naproxen sodium (ANAPROX) 220 MG tablet Take 220 mg by mouth daily.  . simvastatin (ZOCOR) 40 MG tablet Take 40 mg by mouth at bedtime.  . traMADol (ULTRAM) 50 MG tablet Take 1 tablet by mouth Twice daily.  [name] triamcinolone cream (KENALOG) 0.1 % Apply 1 application topically as needed. Poison and bug bites

## 2013-07-17 NOTE — Assessment & Plan Note (Addendum)
I am very concerned about the size of this lesion and the mediastinal lymphadenopathy.  Given her smoking history this is very concerning for bronchogenic carcinoma like adenocarcinoma or squamous cell carcinoma.  The ddx includes benign possibilities, but these are less likely.  I need to obtain the final report from the CT chest, as at this point I am only able to see the images.  Plan: -get full CT chest report -PET/CT to see if extrathoracic sites are FDG-avid and amenable to biopsy -plan EBUS guided FNA of subcarinal lymph node within the week -full PFT -CBC and PT/INR -f/u 4-6 weeks

## 2013-07-17 NOTE — Assessment & Plan Note (Signed)
This appears to be resolving bronchitis.  We checked spirometry today to look for COPD but it was not acceptable by ATS criteria.  The flow volume loop was consistent with obstruction, but the FEV1 volume measured was greater than 80% predicted.  Plan -continue anti-tussive therapy -full PFT

## 2013-07-17 NOTE — Addendum Note (Signed)
Addended by: Max Fickle B on: 07/17/2013 12:16 PM   Modules accepted: Orders

## 2013-07-17 NOTE — Patient Instructions (Signed)
We will schedule you for a PET scan and a bronchoscopy for next week. We will call you within 24 hours to give you a schedule.  Go get labwork before you leave today (in the basement)  We will arrange lung function testing today  We will see you back in 4-6 weeks or sooner if neede

## 2013-07-18 ENCOUNTER — Encounter (HOSPITAL_COMMUNITY): Payer: Self-pay | Admitting: Pharmacy Technician

## 2013-07-18 ENCOUNTER — Encounter (HOSPITAL_COMMUNITY): Payer: Self-pay | Admitting: *Deleted

## 2013-07-18 ENCOUNTER — Ambulatory Visit (INDEPENDENT_AMBULATORY_CARE_PROVIDER_SITE_OTHER): Payer: Medicare Other | Admitting: Pulmonary Disease

## 2013-07-18 DIAGNOSIS — R05 Cough: Secondary | ICD-10-CM | POA: Diagnosis not present

## 2013-07-18 DIAGNOSIS — R222 Localized swelling, mass and lump, trunk: Secondary | ICD-10-CM | POA: Diagnosis not present

## 2013-07-18 DIAGNOSIS — R918 Other nonspecific abnormal finding of lung field: Secondary | ICD-10-CM

## 2013-07-18 DIAGNOSIS — R059 Cough, unspecified: Secondary | ICD-10-CM

## 2013-07-18 LAB — PULMONARY FUNCTION TEST
DLCO unc % pred: 65 %
DLCO unc: 17.68 ml/min/mmHg
FEF 25-75 Post: 0.82 L/sec
FEF2575-%Change-Post: 1 %
FEF2575-%Pred-Pre: 49 %
FEV6-%Change-Post: 3 %
FEV6-%Pred-Post: 92 %
FEV6-%Pred-Pre: 89 %
FEV6-Post: 2.59 L
FEV6FVC-%Change-Post: 0 %
FEV6FVC-%Pred-Post: 102 %
FVC-%Change-Post: 1 %
FVC-%Pred-Post: 89 %
FVC-%Pred-Pre: 88 %
FVC-Post: 2.64 L
Post FEV1/FVC ratio: 61 %
Pre FEV1/FVC ratio: 62 %
Pre FEV6/FVC Ratio: 97 %
RV % pred: 101 %

## 2013-07-18 NOTE — Progress Notes (Signed)
PFT done. By Jerolyn Shin, CMA

## 2013-07-24 ENCOUNTER — Encounter (HOSPITAL_COMMUNITY)
Admission: RE | Admit: 2013-07-24 | Discharge: 2013-07-24 | Disposition: A | Discharge status: Home / Self Care | Payer: Medicare Other | Source: Ambulatory Visit | Attending: Pulmonary Disease | Admitting: Pulmonary Disease

## 2013-07-24 DIAGNOSIS — R918 Other nonspecific abnormal finding of lung field: Secondary | ICD-10-CM

## 2013-07-24 DIAGNOSIS — I700 Atherosclerosis of aorta: Secondary | ICD-10-CM | POA: Insufficient documentation

## 2013-07-24 DIAGNOSIS — R222 Localized swelling, mass and lump, trunk: Secondary | ICD-10-CM | POA: Diagnosis not present

## 2013-07-24 DIAGNOSIS — R911 Solitary pulmonary nodule: Secondary | ICD-10-CM | POA: Insufficient documentation

## 2013-07-24 LAB — GLUCOSE, CAPILLARY: Glucose-Capillary: 112 mg/dL — ABNORMAL HIGH (ref 70–99)

## 2013-07-24 MED ORDER — FLUDEOXYGLUCOSE F - 18 (FDG) INJECTION
17.2000 | Freq: Once | INTRAVENOUS | Status: AC | PRN
  Administered 2013-07-24: 17.2 via INTRAVENOUS

## 2013-07-25 ENCOUNTER — Telehealth: Payer: Self-pay | Admitting: Pulmonary Disease

## 2013-07-25 NOTE — Telephone Encounter (Signed)
Called pt and reviewed PET with her, and the EBUS scheduled for Monday.  She understands risks of GA, bleeding, PTX, and agrees to proceed.

## 2013-07-27 ENCOUNTER — Encounter (HOSPITAL_COMMUNITY): Payer: Self-pay | Admitting: Anesthesiology

## 2013-07-27 NOTE — Anesthesia Preprocedure Evaluation (Deleted)
Anesthesia Evaluation  Patient identified by MRN, date of birth, ID band Patient awake    Reviewed: Allergy & Precautions, H&P , NPO status , Patient's Chart, lab work & pertinent test results  Airway       Dental   Pulmonary former smoker,  Lung CA         Cardiovascular negative cardio ROS      Neuro/Psych negative neurological ROS  negative psych ROS   GI/Hepatic negative GI ROS, Neg liver ROS,   Endo/Other  negative endocrine ROS  Renal/GU negative Renal ROS  negative genitourinary   Musculoskeletal negative musculoskeletal ROS (+)   Abdominal   Peds  Hematology negative hematology ROS (+)   Anesthesia Other Findings   Reproductive/Obstetrics                           Anesthesia Physical Anesthesia Plan  ASA: III  Anesthesia Plan: General   Post-op Pain Management:    Induction: Intravenous  Airway Management Planned: Oral ETT  Additional Equipment:   Intra-op Plan:   Post-operative Plan: Extubation in OR  Informed Consent: I have reviewed the patients History and Physical, chart, labs and discussed the procedure including the risks, benefits and alternatives for the proposed anesthesia with the patient or authorized representative who has indicated his/her understanding and acceptance.   Dental advisory given  Plan Discussed with: CRNA  Anesthesia Plan Comments:         Anesthesia Quick Evaluation

## 2013-07-28 ENCOUNTER — Telehealth: Payer: Self-pay | Admitting: Pulmonary Disease

## 2013-07-28 ENCOUNTER — Ambulatory Visit (HOSPITAL_COMMUNITY)
Admission: RE | Admit: 2013-07-28 | Discharge: 2013-07-28 | Disposition: A | Discharge status: Home / Self Care | Payer: Medicare Other | Source: Ambulatory Visit | Attending: Pulmonary Disease | Admitting: Pulmonary Disease

## 2013-07-28 ENCOUNTER — Encounter (HOSPITAL_COMMUNITY): Payer: Self-pay | Admitting: Pharmacy Technician

## 2013-07-28 ENCOUNTER — Encounter (HOSPITAL_COMMUNITY)
Admission: RE | Disposition: A | Discharge status: Home / Self Care | Payer: Self-pay | Source: Ambulatory Visit | Attending: Pulmonary Disease

## 2013-07-28 ENCOUNTER — Encounter: Payer: Self-pay | Admitting: Internal Medicine

## 2013-07-28 ENCOUNTER — Ambulatory Visit (HOSPITAL_COMMUNITY): Admission: RE | Admit: 2013-07-28 | Payer: Medicare Other | Source: Ambulatory Visit | Admitting: Pulmonary Disease

## 2013-07-28 HISTORY — DX: Malignant neoplasm of unspecified part of unspecified bronchus or lung: C34.90

## 2013-07-28 SURGERY — ENDOBRONCHIAL ULTRASOUND (EBUS)
Anesthesia: General | Laterality: Bilateral

## 2013-07-28 NOTE — Telephone Encounter (Signed)
Spoke to pt she is aware of her EBUS being set up@[facility]  on 08/06/13@8 :30am and was told to arrive at [facility]

## 2013-07-30 ENCOUNTER — Telehealth: Payer: Self-pay | Admitting: Pulmonary Disease

## 2013-07-30 ENCOUNTER — Institutional Professional Consult (permissible substitution) (INDEPENDENT_AMBULATORY_CARE_PROVIDER_SITE_OTHER): Payer: Medicare Other | Admitting: Thoracic Surgery (Cardiothoracic Vascular Surgery)

## 2013-07-30 ENCOUNTER — Other Ambulatory Visit: Payer: Self-pay | Admitting: *Deleted

## 2013-07-30 ENCOUNTER — Encounter: Payer: Self-pay | Admitting: Pulmonary Disease

## 2013-07-30 ENCOUNTER — Encounter: Payer: Self-pay | Admitting: Thoracic Surgery (Cardiothoracic Vascular Surgery)

## 2013-07-30 VITALS — BP 141/72 | HR 81 | Resp 18 | Ht 66.0 in | Wt 187.0 lb

## 2013-07-30 DIAGNOSIS — R599 Enlarged lymph nodes, unspecified: Secondary | ICD-10-CM | POA: Diagnosis not present

## 2013-07-30 DIAGNOSIS — R918 Other nonspecific abnormal finding of lung field: Secondary | ICD-10-CM

## 2013-07-30 DIAGNOSIS — J984 Other disorders of lung: Secondary | ICD-10-CM

## 2013-07-30 DIAGNOSIS — J449 Chronic obstructive pulmonary disease, unspecified: Secondary | ICD-10-CM | POA: Insufficient documentation

## 2013-07-30 DIAGNOSIS — R911 Solitary pulmonary nodule: Secondary | ICD-10-CM

## 2013-07-30 DIAGNOSIS — R590 Localized enlarged lymph nodes: Secondary | ICD-10-CM

## 2013-07-30 DIAGNOSIS — R222 Localized swelling, mass and lump, trunk: Secondary | ICD-10-CM | POA: Diagnosis not present

## 2013-07-30 HISTORY — DX: Chronic obstructive pulmonary disease, unspecified: J44.9

## 2013-07-30 MED ORDER — TIOTROPIUM BROMIDE MONOHYDRATE 18 MCG IN CAPS: 18.0000 ug | ORAL_CAPSULE | Freq: Every day | RESPIRATORY_TRACT | Status: DC

## 2013-07-30 NOTE — Progress Notes (Signed)
PCP is [name], MD Referring Provider is Perini, Redge Gainer, MD  Chief Complaint  Patient presents with  . Lung Mass    RML...eval and treat..CT CHEST 07/11/13.Marland KitchenMarland KitchenPET 07/24/13  . Adenopathy    MEDIASTINAL...HILAR  . Lung Lesion    LUL    HPI: 79 year old woman presents for evaluation of a right lung mass with hilar or mediastinal adenopathy.  Mrs. Barua is a 79 year old woman with a history of tobacco abuse (one half pack per day for 50 years) who recently had a right lung mass found on chest x-ray. She said that she been in her usual state of health. She went to Dr. Jacky Kindle for an annual physical. As part of her evaluation a chest x-ray was done which showed a right lung nodule. This led to a CT of the chest which showed a right middle lobe mass along with hilar and mediastinal adenopathy. She then had a PET/CT which showed these lesions to be hypermetabolic. There also was a small left upper lobe nodule that was hypermetabolic.  She was scheduled for an EBUS at [facility] last week. However there was some mixup in the scheduling in the procedure had to be canceled. She and her husband were understandably upset by this cancellation. They talk to Dr. Waynard Edwards and she was referred here for further evaluation.  She says that she was feeling well except for cough. She had been treated empirically for bronchitis, but the cough was slow to resolve. She has not had any fevers, chills or sweats. She has not had hemoptysis. Her appetite is good. She denies any weight loss. She says that she is very physically active and works out daily. She has stairs in her home and has no shortness of breath or chest pain with walking up and down stairs. She is very anxious about the x-ray findings.   Past Medical History  Diagnosis Date  . Allergy     bee stings/anaphylaxis  . Hyperlipidemia     hx of  . Lung cancer   . COPD (chronic obstructive pulmonary disease) 07/30/13    Past Surgical History   Procedure Laterality Date  . Dilation and curettage of uterus  yrs ago    Family History  Problem Relation Age of Onset  . Heart disease Mother   . Heart disease Father     Social History History  Substance Use Topics  . Smoking status: Former Smoker -- 0.50 packs/day for 50 years    Types: Cigarettes    Quit date: 07/03/2005  . Smokeless tobacco: Never Used  . Alcohol Use: 3.5 oz/week    7 drink(s) per week    Current Outpatient Prescriptions  Medication Sig Dispense Refill  . aspirin 81 MG tablet Take 81 mg by mouth daily.      [name] HYDROcodone-homatropine (HYCODAN) 5-1.5 MG/5ML syrup Take 5 mLs by mouth 2 (two) times daily.      . mupirocin ointment (BACTROBAN) 2 % Apply 1 application topically as needed. For cuts and scrapes      . naproxen sodium (ANAPROX) 220 MG tablet Take 220 mg by mouth daily.      . simvastatin (ZOCOR) 40 MG tablet Take 40 mg by mouth at bedtime.      [name] tiotropium (SPIRIVA) 18 MCG inhalation capsule Place 1 capsule (18 mcg total) into inhaler and inhale daily.  30 capsule  6  . traMADol (ULTRAM) 50 MG tablet Take 1 tablet by mouth daily.  No current facility-administered medications for this visit.    Allergies  Allergen Reactions  . Neosporin [Neomycin-Bacitracin Zn-Polymyx] Rash    Review of Systems  Constitutional: Negative for fever, chills, activity change, appetite change, fatigue and unexpected weight change.  Respiratory: Positive for cough. Negative for chest tightness, shortness of breath and wheezing.   Cardiovascular: Negative for chest pain and leg swelling.  Neurological: Negative.   All other systems reviewed and are negative.    BP 141/72  Pulse 81  Resp 18  Ht 5\' 6"  (1.676 m)  Wt 187 lb (84.823 kg)  BMI 30.20 kg/m2  SpO2 98% Physical Exam  Vitals reviewed. Constitutional: She is oriented to person, place, and time. No distress.  Obese  HENT:  Head: Normocephalic and atraumatic.  Eyes: EOM are normal. Pupils  are equal, round, and reactive to light.  Neck: Neck supple. No thyromegaly present.  Cardiovascular: Normal rate and regular rhythm.   Murmur (2/6 systolic murmur) heard. Pulmonary/Chest: Effort normal and breath sounds normal. She has no wheezes.  Abdominal: Soft. There is no tenderness.  Lymphadenopathy:    She has no cervical adenopathy.  Neurological: She is alert and oriented to person, place, and time. No cranial nerve deficit.  Skin: Skin is warm and dry.     Diagnostic Tests: PET/CT 07/24/2013 NUCLEAR MEDICINE PET SKULL BASE TO THIGH  FASTING BLOOD GLUCOSE: Value: 112mg /dl  TECHNIQUE:  69.6 mCi E-95 FDG was injected intravenously. CT data was obtained  and used for attenuation correction and anatomic localization only.  (This was not acquired as a diagnostic CT examination.) Additional  exam technical data entered on technologist worksheet.  COMPARISON: 07/11/2013  FINDINGS:  NECK  No hypermetabolic lymph nodes in the neck.  CHEST  Right middle lobe lung mass measures 3.5 cm and has an SUV max equal  to 26.7, image 106. Enlarged right hilar lymph node measures 3.1 cm  and has an SUV max equal to 18.2, image 95. Enlarged and  hypermetabolic right paratracheal and sub- carinal lymph nodes  identified. The sub- carinal lymph node measures 2.5 cm and has an  SUV max equal to 13.6, image 92. Within the left upper lobe medially  there is a tiny nodule measuring 4 mm. There is mild increased  uptake associated with this nodule within SUV max equal to 4.3.  ABDOMEN/PELVIS  No abnormal hypermetabolic activity within the liver, pancreas,  adrenal glands, or spleen. No hypermetabolic lymph nodes in the  abdomen or pelvis. There is an indeterminate filling defect within  the lower portion of the rectum. This measures 3.0 cm, image  233/series 2. Increased FDG uptake within this area is identified.  Calcified atherosclerotic disease affects the abdominal aorta.  SKELETON  No  focal hypermetabolic activity to suggest skeletal metastasis.  IMPRESSION:  1. Hypermetabolic right middle lobe mass consistent with primary  lung neoplasm.  2. Hypermetabolic if see lateral hilar and mediastinal lymph node  metastasis.  3. Sub cm nodule in the left upper lobe exhibits malignant range FDG  uptake.  4. Indeterminate filling defect within the lower rectum. Correlation  with colon cancer screening is advised.  5. Calcified atherosclerotic disease.  Electronically Signed  By: Signa Kell M.D.  On: 07/24/2013 15:01  Impression: 79 year old woman with a 3.5 cm right middle lobe mass with significant hilar and mediastinal adenopathy. There also was a very small mass in the left upper lobe (4 mm). All these lesions are hypermetabolic by PET. This picture is consistent with an  advanced stage lung cancer either stage III or IV. It is possible, but highly unlikely, but this would be anything other than a new lung cancer.  She needs a biopsy for diagnosis and staging to guide treatment.  I did discuss with the patient and her husband this did not appear to be something that could be surgically resectable, but would need to be treated with chemotherapy and probably radiation as well.  I recommended that we proceed with bronchoscopy, endobronchial ultrasound, and possible mediastinoscopy. I discussed the general nature of the procedure with them. We discussed the plan to do this in the operating room under general anesthesia. They understand and only resort to mediastinoscopy if we were unable to make the diagnosis with bronchoscopy or endobronchial ultrasound.  I discussed with them the indications, risks, benefits, and alternatives. They understand the risks include those associated with general anesthesia. They understand the risks include, but are not limited to bleeding, pneumothorax, recurrent nerve injury, failure to establish a diagnosis, and stroke. She accepts these risks and  wishes to proceed.  Plan:  Bronchoscopy, endobronchial ultrasound, possible mediastinoscopy on Monday, November 24.

## 2013-07-30 NOTE — Telephone Encounter (Signed)
ATC [T WCB

## 2013-07-30 NOTE — Telephone Encounter (Signed)
Spouse called back. He is aware of were procedure is. Samples left upfront for pick up and will come by after 1:30 to be shown how to use this. Nothing further needed

## 2013-07-30 NOTE — Addendum Note (Signed)
Addended by: Tommie Sams on: 07/30/2013 11:47 AM   Modules accepted: Orders

## 2013-07-30 NOTE — Telephone Encounter (Signed)
I tried to call Elizabeth Humphrey to talk about the scheduling mix up and to make sure she knew how to make it to her EBUS next week.  Unfortunately I could not get through with her mobile or home number.  I'm going to cc triage to try to call her later today to make sure she knows how to make it to her EBUS next week and to answer any questions.  Also, her PFT's showed COPD, so I am going to call in a prescription for spiriva to take once a day.    Triage, please offer to give her samples at our office of the Spiriva.

## 2013-07-31 ENCOUNTER — Encounter (HOSPITAL_COMMUNITY)
Admit: 2013-07-31 | Discharge: 2013-07-31 | Disposition: A | Discharge status: Home / Self Care | Payer: Medicare Other | Source: Ambulatory Visit | Attending: Thoracic Surgery (Cardiothoracic Vascular Surgery) | Admitting: Thoracic Surgery (Cardiothoracic Vascular Surgery)

## 2013-07-31 ENCOUNTER — Encounter (HOSPITAL_COMMUNITY): Payer: Self-pay

## 2013-07-31 VITALS — BP 136/58 | HR 85 | Temp 98.1°F | Resp 18 | Ht 66.0 in | Wt 188.1 lb

## 2013-07-31 DIAGNOSIS — Z01818 Encounter for other preprocedural examination: Secondary | ICD-10-CM | POA: Diagnosis not present

## 2013-07-31 DIAGNOSIS — Z01810 Encounter for preprocedural cardiovascular examination: Secondary | ICD-10-CM | POA: Diagnosis not present

## 2013-07-31 DIAGNOSIS — Z01812 Encounter for preprocedural laboratory examination: Secondary | ICD-10-CM | POA: Insufficient documentation

## 2013-07-31 DIAGNOSIS — R918 Other nonspecific abnormal finding of lung field: Secondary | ICD-10-CM

## 2013-07-31 DIAGNOSIS — R590 Localized enlarged lymph nodes: Secondary | ICD-10-CM

## 2013-07-31 HISTORY — DX: Cardiac murmur, unspecified: R01.1

## 2013-07-31 HISTORY — DX: Personal history of pneumonia (recurrent): Z87.01

## 2013-07-31 HISTORY — DX: Unspecified osteoarthritis, unspecified site: M19.90

## 2013-07-31 LAB — CBC
HCT: 40 % (ref 36.0–46.0)
Hemoglobin: 13.4 g/dL (ref 12.0–15.0)
MCH: 31.8 pg (ref 26.0–34.0)
RBC: 4.22 MIL/uL (ref 3.87–5.11)
RDW: 14 % (ref 11.5–15.5)
WBC: 6.7 10*3/uL (ref 4.0–10.5)

## 2013-07-31 LAB — COMPREHENSIVE METABOLIC PANEL
Albumin: 4.2 g/dL (ref 3.5–5.2)
Alkaline Phosphatase: 60 U/L (ref 39–117)
BUN: 23 mg/dL (ref 6–23)
Calcium: 9.4 mg/dL (ref 8.4–10.5)
Chloride: 105 mEq/L (ref 96–112)
Potassium: 4.1 mEq/L (ref 3.5–5.1)
Total Bilirubin: 0.4 mg/dL (ref 0.3–1.2)

## 2013-07-31 LAB — TYPE AND SCREEN
ABO/RH(D): O NEG
Antibody Screen: NEGATIVE

## 2013-07-31 LAB — PROTIME-INR: Prothrombin Time: 12.8 seconds (ref 11.6–15.2)

## 2013-07-31 LAB — APTT: aPTT: 38 seconds — ABNORMAL HIGH (ref 24–37)

## 2013-07-31 NOTE — Pre-Procedure Instructions (Signed)
Elizabeth Humphrey  07/31/2013   Your procedure is scheduled on:  Monday, August 04, 2013 at 7:30 AM  Report to [facility] Entrance "A" [address] at [employer] AM.  Call this number if you have problems the morning of surgery: [phone]   Remember:   Do not eat food or drink liquids after midnight.   Take these medicines the morning of surgery with A SIP OF WATER: Tramadol (Ultram)   STOP Aspirin, Aleve, Naproxen, Advil, Ibuprofen, Vitamins, Herbs, and Supplements starting today.   Do not wear jewelry, make-up or nail polish.  Do not wear lotions, powders, or perfumes. You may wear deodorant.  Do not shave 48 hours prior to surgery.  Do not bring valuables to the hospital.  [facility] is not responsible                  for any belongings or valuables.               Contacts, dentures or bridgework may not be worn into surgery.  Leave suitcase in the car. After surgery it may be brought to your room.  For patients admitted to the hospital, discharge time is determined by your                treatment team.               Patients discharged the day of surgery will not be allowed to drive  home.  Name and phone number of your driver: Family/Friend  Special Instructions: Shower using CHG 2 nights before surgery and the night before surgery.  If you shower the day of surgery use CHG.  Use special wash - you have one bottle of CHG for all showers.  You should use approximately 1/3 of the bottle for each shower.   Please read over the following fact sheets that you were given: Pain Booklet, Coughing and Deep Breathing, Blood Transfusion Information and Surgical Site Infection Prevention

## 2013-07-31 NOTE — Progress Notes (Signed)
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  PCP is Dr. Geoffry Paradise.  EKG, CXR done about a month ago with Dr. Jacky Kindle, records requested.  Pt denies having ECHO or stress test.

## 2013-08-01 ENCOUNTER — Encounter (HOSPITAL_COMMUNITY): Payer: Self-pay

## 2013-08-01 NOTE — Progress Notes (Signed)
Anesthesia chart review: Patient is a 79 year old female scheduled for video bronchoscopy with EBUS, possible mediastinoscopy on 08/04/2013 by Dr. Dorris Fetch. She was recently found to have a 3.5 cm RML mass with significant hilar and mediastinal adenopathy and LUL lesion that were hypermetabolic of PET and suspicious for stage III or IV lung cancer.  Other history includes COPD, former smoker, heart murmur (not specified), hyperlipidemia, arthritis, tonsillectomy, cataract extraction.  EKG on 07/31/13 showed NSR, LAD, possible anterior infarct (age undetermined), non-specific ST abnormality.  Overall, EKG is stable when compared to prior EKG on 07/02/13 at Dr. Lanell Matar office.  Preoperative labs noted.  PFTs on 07/17/13 showed FVE 2.90 (95%), FEV1 1.83 (82%).  She is for a CXR on the day of surgery.  No chest pain symptoms were documented at her PAT or H&P visit.  If no acute changes then I would anticipate that she could proceed as planned.  Velna Ochs [facility] Short Stay Center/Anesthesiology Phone [phone] 08/01/2013 9:44 AM

## 2013-08-01 NOTE — Progress Notes (Signed)
Left message for patient to arrive at 8:30 AM, since surgery time changed to 13:30pm.  DA

## 2013-08-03 MED ORDER — CEFUROXIME SODIUM 1.5 G IJ SOLR
1.5000 g | INTRAMUSCULAR | Status: AC
  Administered 2013-08-04: 1.5 g via INTRAVENOUS
  Filled 2013-08-03: qty 1.5

## 2013-08-04 ENCOUNTER — Encounter (HOSPITAL_COMMUNITY)
Admission: RE | Disposition: A | Discharge status: Home / Self Care | Payer: Self-pay | Source: Ambulatory Visit | Attending: Thoracic Surgery (Cardiothoracic Vascular Surgery)

## 2013-08-04 ENCOUNTER — Encounter (HOSPITAL_COMMUNITY): Payer: Medicare Other | Admitting: Vascular Surgery

## 2013-08-04 ENCOUNTER — Encounter (HOSPITAL_COMMUNITY): Payer: Self-pay | Admitting: Certified Registered Nurse Anesthetist

## 2013-08-04 ENCOUNTER — Ambulatory Visit (HOSPITAL_COMMUNITY)
Admission: RE | Admit: 2013-08-04 | Discharge: 2013-08-04 | Disposition: A | Discharge status: Home / Self Care | Payer: Medicare Other | Source: Ambulatory Visit | Attending: Thoracic Surgery (Cardiothoracic Vascular Surgery) | Admitting: Thoracic Surgery (Cardiothoracic Vascular Surgery)

## 2013-08-04 ENCOUNTER — Ambulatory Visit (HOSPITAL_COMMUNITY): Payer: Medicare Other

## 2013-08-04 ENCOUNTER — Ambulatory Visit (HOSPITAL_COMMUNITY): Payer: Medicare Other | Admitting: Certified Registered Nurse Anesthetist

## 2013-08-04 DIAGNOSIS — J449 Chronic obstructive pulmonary disease, unspecified: Secondary | ICD-10-CM | POA: Insufficient documentation

## 2013-08-04 DIAGNOSIS — C801 Malignant (primary) neoplasm, unspecified: Secondary | ICD-10-CM | POA: Insufficient documentation

## 2013-08-04 DIAGNOSIS — E785 Hyperlipidemia, unspecified: Secondary | ICD-10-CM | POA: Diagnosis not present

## 2013-08-04 DIAGNOSIS — Z87891 Personal history of nicotine dependence: Secondary | ICD-10-CM | POA: Diagnosis not present

## 2013-08-04 DIAGNOSIS — Z79899 Other long term (current) drug therapy: Secondary | ICD-10-CM | POA: Insufficient documentation

## 2013-08-04 DIAGNOSIS — Z7982 Long term (current) use of aspirin: Secondary | ICD-10-CM | POA: Insufficient documentation

## 2013-08-04 DIAGNOSIS — C3490 Malignant neoplasm of unspecified part of unspecified bronchus or lung: Secondary | ICD-10-CM | POA: Diagnosis not present

## 2013-08-04 DIAGNOSIS — C771 Secondary and unspecified malignant neoplasm of intrathoracic lymph nodes: Secondary | ICD-10-CM | POA: Diagnosis not present

## 2013-08-04 DIAGNOSIS — C779 Secondary and unspecified malignant neoplasm of lymph node, unspecified: Secondary | ICD-10-CM | POA: Diagnosis not present

## 2013-08-04 DIAGNOSIS — J4489 Other specified chronic obstructive pulmonary disease: Secondary | ICD-10-CM | POA: Insufficient documentation

## 2013-08-04 DIAGNOSIS — R918 Other nonspecific abnormal finding of lung field: Secondary | ICD-10-CM

## 2013-08-04 DIAGNOSIS — R599 Enlarged lymph nodes, unspecified: Secondary | ICD-10-CM

## 2013-08-04 DIAGNOSIS — R222 Localized swelling, mass and lump, trunk: Secondary | ICD-10-CM | POA: Diagnosis not present

## 2013-08-04 DIAGNOSIS — R590 Localized enlarged lymph nodes: Secondary | ICD-10-CM

## 2013-08-04 DIAGNOSIS — D381 Neoplasm of uncertain behavior of trachea, bronchus and lung: Secondary | ICD-10-CM

## 2013-08-04 HISTORY — PX: VIDEO BRONCHOSCOPY WITH ENDOBRONCHIAL ULTRASOUND: SHX6177

## 2013-08-04 SURGERY — BRONCHOSCOPY, WITH EBUS
Anesthesia: General | Wound class: Clean Contaminated

## 2013-08-04 MED ORDER — EPINEPHRINE HCL 1 MG/ML IJ SOLN
INTRAMUSCULAR | Status: AC
  Filled 2013-08-04: qty 1

## 2013-08-04 MED ORDER — LACTATED RINGERS IV SOLN
INTRAVENOUS | Status: DC
  Administered 2013-08-04: 09:00:00 via INTRAVENOUS

## 2013-08-04 MED ORDER — MIDAZOLAM HCL 5 MG/5ML IJ SOLN
INTRAMUSCULAR | Status: DC | PRN
  Administered 2013-08-04: 1 mg via INTRAVENOUS

## 2013-08-04 MED ORDER — ONDANSETRON HCL 4 MG/2ML IJ SOLN
INTRAMUSCULAR | Status: DC | PRN
  Administered 2013-08-04: 4 mg via INTRAVENOUS

## 2013-08-04 MED ORDER — FENTANYL CITRATE 0.05 MG/ML IJ SOLN: 25.0000 ug | INTRAMUSCULAR | Status: DC | PRN

## 2013-08-04 MED ORDER — LACTATED RINGERS IV SOLN: INTRAVENOUS | Status: DC | PRN

## 2013-08-04 MED ORDER — NEOSTIGMINE METHYLSULFATE 1 MG/ML IJ SOLN
INTRAMUSCULAR | Status: DC | PRN
  Administered 2013-08-04: 4 mg via INTRAVENOUS

## 2013-08-04 MED ORDER — ROCURONIUM BROMIDE 100 MG/10ML IV SOLN
INTRAVENOUS | Status: DC | PRN
  Administered 2013-08-04: 5 mg via INTRAVENOUS
  Administered 2013-08-04: 40 mg via INTRAVENOUS

## 2013-08-04 MED ORDER — DEXAMETHASONE SODIUM PHOSPHATE 10 MG/ML IJ SOLN
INTRAMUSCULAR | Status: DC | PRN
  Administered 2013-08-04: 4 mg via INTRAVENOUS

## 2013-08-04 MED ORDER — 0.9 % SODIUM CHLORIDE (POUR BTL) OPTIME
TOPICAL | Status: DC | PRN
  Administered 2013-08-04: 1000 mL

## 2013-08-04 MED ORDER — PHENYLEPHRINE HCL 10 MG/ML IJ SOLN
INTRAMUSCULAR | Status: DC | PRN
  Administered 2013-08-04 (×2): 40 ug via INTRAVENOUS

## 2013-08-04 MED ORDER — METOCLOPRAMIDE HCL 5 MG/ML IJ SOLN: 10.0000 mg | Freq: Once | INTRAMUSCULAR | Status: DC | PRN

## 2013-08-04 MED ORDER — LIDOCAINE HCL (PF) 1 % IJ SOLN
INTRAMUSCULAR | Status: AC
  Filled 2013-08-04: qty 30

## 2013-08-04 MED ORDER — LIDOCAINE HCL (CARDIAC) 20 MG/ML IV SOLN
INTRAVENOUS | Status: DC | PRN
  Administered 2013-08-04: 70 mg via INTRAVENOUS

## 2013-08-04 MED ORDER — GLYCOPYRROLATE 0.2 MG/ML IJ SOLN
INTRAMUSCULAR | Status: DC | PRN
  Administered 2013-08-04: 0.6 mg via INTRAVENOUS

## 2013-08-04 MED ORDER — PROPOFOL 10 MG/ML IV BOLUS
INTRAVENOUS | Status: DC | PRN
  Administered 2013-08-04: 130 mg via INTRAVENOUS

## 2013-08-04 MED ORDER — OXYCODONE HCL 5 MG/5ML PO SOLN: 5.0000 mg | Freq: Once | ORAL | Status: DC | PRN

## 2013-08-04 MED ORDER — EPHEDRINE SULFATE 50 MG/ML IJ SOLN
INTRAMUSCULAR | Status: DC | PRN
  Administered 2013-08-04: 5 mg via INTRAVENOUS
  Administered 2013-08-04: 10 mg via INTRAVENOUS

## 2013-08-04 MED ORDER — LACTATED RINGERS IV SOLN
INTRAVENOUS | Status: DC | PRN
  Administered 2013-08-04 (×2): via INTRAVENOUS

## 2013-08-04 MED ORDER — OXYCODONE HCL 5 MG PO TABS: 5.0000 mg | ORAL_TABLET | Freq: Once | ORAL | Status: DC | PRN

## 2013-08-04 MED ORDER — FENTANYL CITRATE 0.05 MG/ML IJ SOLN
INTRAMUSCULAR | Status: DC | PRN
  Administered 2013-08-04: 100 ug via INTRAVENOUS
  Administered 2013-08-04: 50 ug via INTRAVENOUS

## 2013-08-04 SURGICAL SUPPLY — 64 items
ADH SKN CLS APL DERMABOND .7 (GAUZE/BANDAGES/DRESSINGS)
APPLIER CLIP LOGIC TI 5 (MISCELLANEOUS) IMPLANT
APR CLP MED LRG 33X5 (MISCELLANEOUS)
BALL CTTN LRG ABS STRL LF (GAUZE/BANDAGES/DRESSINGS)
BLADE SURG 15 STRL LF DISP TIS (BLADE) IMPLANT
BLADE SURG 15 STRL SS (BLADE)
BRUSH CYTOL CELLEBRITY 1.5X140 (MISCELLANEOUS) IMPLANT
CANISTER SUCTION 2500CC (MISCELLANEOUS) ×3 IMPLANT
CLIP TI MEDIUM 6 (CLIP) IMPLANT
CONT SPEC 4OZ CLIKSEAL STRL BL (MISCELLANEOUS) ×5 IMPLANT
COTTONBALL LRG STERILE PKG (GAUZE/BANDAGES/DRESSINGS) IMPLANT
COVER SURGICAL LIGHT HANDLE (MISCELLANEOUS) ×3 IMPLANT
COVER TABLE BACK 60X90 (DRAPES) ×2 IMPLANT
DERMABOND ADVANCED (GAUZE/BANDAGES/DRESSINGS)
DERMABOND ADVANCED .7 DNX12 (GAUZE/BANDAGES/DRESSINGS) ×1 IMPLANT
DRAPE CHEST BREAST 15X10 FENES (DRAPES) ×1 IMPLANT
ELECT REM PT RETURN 9FT ADLT (ELECTROSURGICAL)
ELECTRODE REM PT RTRN 9FT ADLT (ELECTROSURGICAL) ×1 IMPLANT
FILTER STRAW FLUID ASPIR (MISCELLANEOUS) IMPLANT
FORCEPS BIOP RJ4 1.8 (CUTTING FORCEPS) IMPLANT
GAUZE SPONGE 4X4 16PLY XRAY LF (GAUZE/BANDAGES/DRESSINGS) ×1 IMPLANT
GLOVE SURG SIGNA 7.5 PF LTX (GLOVE) ×3 IMPLANT
GLOVE SURG SS PI 6.5 STRL IVOR (GLOVE) ×1 IMPLANT
GOWN PREVENTION PLUS XLARGE (GOWN DISPOSABLE) ×3 IMPLANT
GOWN STRL NON-REIN LRG LVL3 (GOWN DISPOSABLE) ×2 IMPLANT
HEMOSTAT SURGICEL 2X14 (HEMOSTASIS) IMPLANT
KIT BASIN OR (CUSTOM PROCEDURE TRAY) ×2 IMPLANT
KIT ROOM TURNOVER OR (KITS) ×3 IMPLANT
MARKER SKIN DUAL TIP RULER LAB (MISCELLANEOUS) ×2 IMPLANT
NDL BIOPSY TRANSBRONCH 21G (NEEDLE) IMPLANT
NDL BLUNT 18X1 FOR OR ONLY (NEEDLE) IMPLANT
NDL WANG 19GA 15MM 130CM (NEEDLE) IMPLANT
NEEDLE 22X1 1/2 (OR ONLY) (NEEDLE) IMPLANT
NEEDLE BIOPSY TRANSBRONCH 21G (NEEDLE) IMPLANT
NEEDLE BLUNT 18X1 FOR OR ONLY (NEEDLE) IMPLANT
NEEDLE SYS SONOTIP II EBUSTBNA (NEEDLE) ×2 IMPLANT
NEEDLE WANG 19GA 15MM 130CM (NEEDLE) ×2 IMPLANT
NS IRRIG 1000ML POUR BTL (IV SOLUTION) ×3 IMPLANT
OIL SILICONE PENTAX (PARTS (SERVICE/REPAIRS)) IMPLANT
PACK SURGICAL SETUP 50X90 (CUSTOM PROCEDURE TRAY) ×2 IMPLANT
PAD ARMBOARD 7.5X6 YLW CONV (MISCELLANEOUS) ×6 IMPLANT
PENCIL BUTTON HOLSTER BLD 10FT (ELECTRODE) ×1 IMPLANT
SPONGE GAUZE 4X4 12PLY (GAUZE/BANDAGES/DRESSINGS) ×1 IMPLANT
SPONGE INTESTINAL PEANUT (DISPOSABLE) IMPLANT
SUT SILK 2 0 TIES 10X30 (SUTURE) IMPLANT
SUT VIC AB 2-0 CT1 27 (SUTURE)
SUT VIC AB 2-0 CT1 TAPERPNT 27 (SUTURE) IMPLANT
SUT VIC AB 3-0 SH 18 (SUTURE) IMPLANT
SUT VIC AB 3-0 SH 27 (SUTURE)
SUT VIC AB 3-0 SH 27X BRD (SUTURE) ×1 IMPLANT
SUT VICRYL 4-0 PS2 18IN ABS (SUTURE) ×1 IMPLANT
SWAB COLLECTION DEVICE MRSA (MISCELLANEOUS) IMPLANT
SYR 20CC LL (SYRINGE) ×2 IMPLANT
SYR 20ML ECCENTRIC (SYRINGE) ×2 IMPLANT
SYR 5ML LL (SYRINGE) ×1 IMPLANT
SYR 5ML LUER SLIP (SYRINGE) ×1 IMPLANT
SYR CONTROL 10ML LL (SYRINGE) IMPLANT
SYRINGE 10CC LL (SYRINGE) ×1 IMPLANT
TOWEL OR 17X24 6PK STRL BLUE (TOWEL DISPOSABLE) ×2 IMPLANT
TOWEL OR 17X26 10 PK STRL BLUE (TOWEL DISPOSABLE) ×1 IMPLANT
TRAP SPECIMEN MUCOUS 40CC (MISCELLANEOUS) ×2 IMPLANT
TUBE ANAEROBIC SPECIMEN COL (MISCELLANEOUS) IMPLANT
TUBE CONNECTING 12X1/4 (SUCTIONS) ×3 IMPLANT
WATER STERILE IRR 1000ML POUR (IV SOLUTION) ×1 IMPLANT

## 2013-08-04 NOTE — Anesthesia Procedure Notes (Signed)
Procedure Name: Intubation Date/Time: 08/04/2013 2:20 PM Performed by: Sarita Haver T Pre-anesthesia Checklist: Patient identified, Timeout performed, Emergency Drugs available, Suction available and Patient being monitored Patient Re-evaluated:Patient Re-evaluated prior to inductionOxygen Delivery Method: Circle system utilized and Simple face mask Preoxygenation: Pre-oxygenation with 100% oxygen Intubation Type: IV induction Ventilation: Mask ventilation with difficulty and Oral airway inserted - appropriate to patient size Laryngoscope Size: Hyacinth Meeker and 2 Grade View: Grade I Tube type: Oral Tube size: 9.0 mm Number of attempts: 1 Airway Equipment and Method: Patient positioned with wedge pillow and Stylet Placement Confirmation: ETT inserted through vocal cords under direct vision,  positive ETCO2 and breath sounds checked- equal and bilateral Secured at: 22 cm Tube secured with: Tape Dental Injury: Teeth and Oropharynx as per pre-operative assessment

## 2013-08-04 NOTE — Interval H&P Note (Signed)
History and Physical Interval Note:  08/04/2013 2:02 PM  Elizabeth Humphrey  has presented today for surgery, with the diagnosis of Right Lung Mass; Mediastinal Adenopathy  The various methods of treatment have been discussed with the patient and family. After consideration of risks, benefits and other options for treatment, the patient has consented to  Procedure(s): VIDEO BRONCHOSCOPY WITH ENDOBRONCHIAL ULTRASOUND (N/A) MEDIASTINOSCOPY (N/A) as a surgical intervention .  The patient's history has been reviewed, patient examined, no change in status, stable for surgery.  I have reviewed the patient's chart and labs.  Questions were answered to the patient's satisfaction.     HENDRICKSON,STEVEN C

## 2013-08-04 NOTE — Transfer of Care (Signed)
Immediate Anesthesia Transfer of Care Note  Patient: Elizabeth Humphrey  Procedure(s) Performed: Procedure(s): VIDEO BRONCHOSCOPY WITH ENDOBRONCHIAL ULTRASOUND (N/A)  Patient Location: PACU  Anesthesia Type:General  Level of Consciousness: awake, alert  and oriented  Airway & Oxygen Therapy: Patient Spontanous Breathing and Patient connected to nasal cannula oxygen  Post-op Assessment: Report given to PACU RN, Post -op Vital signs reviewed and stable and Patient moving all extremities X 4  Post vital signs: Reviewed and stable  Complications: No apparent anesthesia complications

## 2013-08-04 NOTE — Anesthesia Preprocedure Evaluation (Addendum)
Anesthesia Evaluation  Patient identified by MRN, date of birth, ID band Patient awake    Reviewed: Allergy & Precautions, H&P , NPO status , Patient's Chart, lab work & pertinent test results, reviewed documented beta blocker date and time   Airway Mallampati: II TM Distance: >3 FB Neck ROM: full    Dental  (+) Edentulous Upper and Edentulous Lower   Pulmonary COPD COPD inhaler, former smoker,  breath sounds clear to auscultation        Cardiovascular + Valvular Problems/Murmurs Rhythm:regular     Neuro/Psych negative neurological ROS  negative psych ROS   GI/Hepatic negative GI ROS, Neg liver ROS,   Endo/Other  negative endocrine ROS  Renal/GU negative Renal ROS  negative genitourinary   Musculoskeletal   Abdominal   Peds  Hematology negative hematology ROS (+)   Anesthesia Other Findings See surgeon's H&P   Reproductive/Obstetrics negative OB ROS                          Anesthesia Physical Anesthesia Plan  ASA: III  Anesthesia Plan: General   Post-op Pain Management:    Induction: Intravenous  Airway Management Planned: Oral ETT  Additional Equipment:   Intra-op Plan:   Post-operative Plan: Extubation in OR  Informed Consent: I have reviewed the patients History and Physical, chart, labs and discussed the procedure including the risks, benefits and alternatives for the proposed anesthesia with the patient or authorized representative who has indicated his/her understanding and acceptance.   Dental Advisory Given  Plan Discussed with: CRNA and Surgeon  Anesthesia Plan Comments:         Anesthesia Quick Evaluation

## 2013-08-04 NOTE — Anesthesia Postprocedure Evaluation (Signed)
  Anesthesia Post-op Note  Patient: Elizabeth Humphrey  Procedure(s) Performed: Procedure(s): VIDEO BRONCHOSCOPY WITH ENDOBRONCHIAL ULTRASOUND (N/A)  Patient Location: PACU  Anesthesia Type:General  Level of Consciousness: awake, alert  and oriented  Airway and Oxygen Therapy: Patient Spontanous Breathing and Patient connected to nasal cannula oxygen  Post-op Pain: mild  Post-op Assessment: Post-op Vital signs reviewed, Patient's Cardiovascular Status Stable, Respiratory Function Stable, Patent Airway, No signs of Nausea or vomiting and Pain level controlled  Post-op Vital Signs: stable  Complications: No apparent anesthesia complications

## 2013-08-04 NOTE — Preoperative (Signed)
Beta Blockers   Reason not to administer Beta Blockers:Not Applicable 

## 2013-08-04 NOTE — Brief Op Note (Signed)
08/04/2013  4:00 PM  PATIENT:  Elizabeth Humphrey  79 y.o. female  PRE-OPERATIVE DIAGNOSIS:  Right Lung Mass; Mediastinal Adenopathy  POST-OPERATIVE DIAGNOSIS:  Right Lung Mass; Mediastinal Adenopathy  PROCEDURE:  Procedure(s): VIDEO BRONCHOSCOPY WITH ENDOBRONCHIAL ULTRASOUND (N/A)  SURGEON:  Surgeon(s) and Role:    * Loreli Slot, MD - Primary  PHYSICIAN ASSISTANT:   ASSISTANTS: none   ANESTHESIA:   general  EBL:  Total I/O In: 1000 [I.V.:1000] Out: 25 [Blood:25]  SPECIMEN:  Source of Specimen:  Level 4R and 7 lymph nodes  DISPOSITION OF SPECIMEN:  PATHOLOGY  COUNTS:  NO N/A  PLAN OF CARE: Discharge to home after PACU  PATIENT DISPOSITION:  PACU - hemodynamically stable.   Delay start of Pharmacological VTE agent (>24hrs) due to surgical blood loss or risk of bleeding: not applicable  QUICK PREP- + MALIGNANCY, favor small cell but will need to review final cytology

## 2013-08-04 NOTE — H&P (View-Only) (Signed)
PCP is ARONSON,RICHARD A, MD Referring Provider is Perini, Mark A, MD  Chief Complaint  Patient presents with  . Lung Mass    RML...eval and treat..CT CHEST 07/11/13...PET 07/24/13  . Adenopathy    MEDIASTINAL...HILAR  . Lung Lesion    LUL    HPI: 79-year-old woman presents for evaluation of a right lung mass with hilar or mediastinal adenopathy.  Mrs. Elizabeth Humphrey is a 79-year-old woman with a history of tobacco abuse (one half pack per day for 50 years) who recently had a right lung mass found on chest x-ray. She said that she been in her usual state of health. She went to Dr. Aronson for an annual physical. As part of her evaluation a chest x-ray was done which showed a right lung nodule. This led to a CT of the chest which showed a right middle lobe mass along with hilar and mediastinal adenopathy. She then had a PET/CT which showed these lesions to be hypermetabolic. There also was a small left upper lobe nodule that was hypermetabolic.  She was scheduled for an EBUS at [facility] last week. However there was some mixup in the scheduling in the procedure had to be canceled. She and her husband were understandably upset by this cancellation. They talk to Dr. Perini and she was referred here for further evaluation.  She says that she was feeling well except for cough. She had been treated empirically for bronchitis, but the cough was slow to resolve. She has not had any fevers, chills or sweats. She has not had hemoptysis. Her appetite is good. She denies any weight loss. She says that she is very physically active and works out daily. She has stairs in her home and has no shortness of breath or chest pain with walking up and down stairs. She is very anxious about the x-ray findings.   Past Medical History  Diagnosis Date  . Allergy     bee stings/anaphylaxis  . Hyperlipidemia     hx of  . Lung cancer   . COPD (chronic obstructive pulmonary disease) 07/30/13    Past Surgical History   Procedure Laterality Date  . Dilation and curettage of uterus  yrs ago    Family History  Problem Relation Age of Onset  . Heart disease Mother   . Heart disease Father     Social History History  Substance Use Topics  . Smoking status: Former Smoker -- 0.50 packs/day for 50 years    Types: Cigarettes    Quit date: 07/03/2005  . Smokeless tobacco: Never Used  . Alcohol Use: 3.5 oz/week    7 drink(s) per week    Current Outpatient Prescriptions  Medication Sig Dispense Refill  . aspirin 81 MG tablet Take 81 mg by mouth daily.      . HYDROcodone-homatropine (HYCODAN) 5-1.5 MG/5ML syrup Take 5 mLs by mouth 2 (two) times daily.      . mupirocin ointment (BACTROBAN) 2 % Apply 1 application topically as needed. For cuts and scrapes      . naproxen sodium (ANAPROX) 220 MG tablet Take 220 mg by mouth daily.      . simvastatin (ZOCOR) 40 MG tablet Take 40 mg by mouth at bedtime.      . tiotropium (SPIRIVA) 18 MCG inhalation capsule Place 1 capsule (18 mcg total) into inhaler and inhale daily.  30 capsule  6  . traMADol (ULTRAM) 50 MG tablet Take 1 tablet by mouth daily.          No current facility-administered medications for this visit.    Allergies  Allergen Reactions  . Neosporin [Neomycin-Bacitracin Zn-Polymyx] Rash    Review of Systems  Constitutional: Negative for fever, chills, activity change, appetite change, fatigue and unexpected weight change.  Respiratory: Positive for cough. Negative for chest tightness, shortness of breath and wheezing.   Cardiovascular: Negative for chest pain and leg swelling.  Neurological: Negative.   All other systems reviewed and are negative.    BP 141/72  Pulse 81  Resp 18  Ht 5' 6" (1.676 m)  Wt 187 lb (84.823 kg)  BMI 30.20 kg/m2  SpO2 98% Physical Exam  Vitals reviewed. Constitutional: She is oriented to person, place, and time. No distress.  Obese  HENT:  Head: Normocephalic and atraumatic.  Eyes: EOM are normal. Pupils  are equal, round, and reactive to light.  Neck: Neck supple. No thyromegaly present.  Cardiovascular: Normal rate and regular rhythm.   Murmur (2/6 systolic murmur) heard. Pulmonary/Chest: Effort normal and breath sounds normal. She has no wheezes.  Abdominal: Soft. There is no tenderness.  Lymphadenopathy:    She has no cervical adenopathy.  Neurological: She is alert and oriented to person, place, and time. No cranial nerve deficit.  Skin: Skin is warm and dry.     Diagnostic Tests: PET/CT 07/24/2013 NUCLEAR MEDICINE PET SKULL BASE TO THIGH  FASTING BLOOD GLUCOSE: Value: 112mg/dl  TECHNIQUE:  17.2 mCi F-18 FDG was injected intravenously. CT data was obtained  and used for attenuation correction and anatomic localization only.  (This was not acquired as a diagnostic CT examination.) Additional  exam technical data entered on technologist worksheet.  COMPARISON: 07/11/2013  FINDINGS:  NECK  No hypermetabolic lymph nodes in the neck.  CHEST  Right middle lobe lung mass measures 3.5 cm and has an SUV max equal  to 26.7, image 106. Enlarged right hilar lymph node measures 3.1 cm  and has an SUV max equal to 18.2, image 95. Enlarged and  hypermetabolic right paratracheal and sub- carinal lymph nodes  identified. The sub- carinal lymph node measures 2.5 cm and has an  SUV max equal to 13.6, image 92. Within the left upper lobe medially  there is a tiny nodule measuring 4 mm. There is mild increased  uptake associated with this nodule within SUV max equal to 4.3.  ABDOMEN/PELVIS  No abnormal hypermetabolic activity within the liver, pancreas,  adrenal glands, or spleen. No hypermetabolic lymph nodes in the  abdomen or pelvis. There is an indeterminate filling defect within  the lower portion of the rectum. This measures 3.0 cm, image  233/series 2. Increased FDG uptake within this area is identified.  Calcified atherosclerotic disease affects the abdominal aorta.  SKELETON  No  focal hypermetabolic activity to suggest skeletal metastasis.  IMPRESSION:  1. Hypermetabolic right middle lobe mass consistent with primary  lung neoplasm.  2. Hypermetabolic if see lateral hilar and mediastinal lymph node  metastasis.  3. Sub cm nodule in the left upper lobe exhibits malignant range FDG  uptake.  4. Indeterminate filling defect within the lower rectum. Correlation  with colon cancer screening is advised.  5. Calcified atherosclerotic disease.  Electronically Signed  By: Taylor Stroud M.D.  On: 07/24/2013 15:01  Impression: 79-year-old woman with a 3.5 cm right middle lobe mass with significant hilar and mediastinal adenopathy. There also was a very small mass in the left upper lobe (4 mm). All these lesions are hypermetabolic by PET. This picture is consistent with an   advanced stage lung cancer either stage III or IV. It is possible, but highly unlikely, but this would be anything other than a new lung cancer.  She needs a biopsy for diagnosis and staging to guide treatment.  I did discuss with the patient and her husband this did not appear to be something that could be surgically resectable, but would need to be treated with chemotherapy and probably radiation as well.  I recommended that we proceed with bronchoscopy, endobronchial ultrasound, and possible mediastinoscopy. I discussed the general nature of the procedure with them. We discussed the plan to do this in the operating room under general anesthesia. They understand and only resort to mediastinoscopy if we were unable to make the diagnosis with bronchoscopy or endobronchial ultrasound.  I discussed with them the indications, risks, benefits, and alternatives. They understand the risks include those associated with general anesthesia. They understand the risks include, but are not limited to bleeding, pneumothorax, recurrent nerve injury, failure to establish a diagnosis, and stroke. She accepts these risks and  wishes to proceed.  Plan:  Bronchoscopy, endobronchial ultrasound, possible mediastinoscopy on Monday, November 24.    

## 2013-08-05 DIAGNOSIS — Z1212 Encounter for screening for malignant neoplasm of rectum: Secondary | ICD-10-CM | POA: Diagnosis not present

## 2013-08-05 NOTE — Op Note (Signed)
NAMETEMPEST, FRANKLAND             ACCOUNT NO.:  [id number]  MEDICAL RECORD NO.:  [id number]  LOCATION:  [facility]                         FACILITY:  [facility]  PHYSICIAN:  Salvatore Decent. Dorris Fetch, M.D.DATE OF BIRTH:  October 13, 1933  DATE OF PROCEDURE:  08/04/2013 DATE OF DISCHARGE:  08/04/2013                              OPERATIVE REPORT   PREOPERATIVE DIAGNOSIS:  Right middle lobe mass with hilar mediastinal adenopathy.  POSTOPERATIVE DIAGNOSIS:  Malignancy, possible small cell carcinoma.  PROCEDURE:  Bronchoscopy and endobronchial ultrasound with mediastinal lymph node aspirations.  SURGEON:  Salvatore Decent. Dorris Fetch, MD  ASSISTANT:  None.  ANESTHESIA:  General.  FINDINGS:  Markedly enlarged 4R and 7 lymph nodes.  Quick prep of level 7 and 4 nodes revealed malignancy probable small cell, but will await final pathology.  CLINICAL NOTE:  Ms. Mcintire is a 79 year old woman with a history of tobacco abuse, who recently was found to have a right lung mass on a chest x-ray during her annual physical.  A CT showed a right middle lobe mass with hilar and mediastinal adenopathy.  A PET-CT showed these lesions to be hypermetabolic.  There also was a small hypermetabolic left upper lobe nodule.  She was advised to undergo bronchoscopy and endobronchial ultrasound for diagnostic and staging purposes.  The indications risks, benefits, and alternatives were discussed in detail with the patient.  We did discuss the possibility of doing a mediastinoscopy if the endobronchial ultrasound was unrevealing.  She understood and accepted the risks and agreed to proceed.  OPERATIVE NOTE:  Ms. Cercone was brought to the operating room on August 04, 2013.  She had induction of general anesthesia by the Anesthesia Service.  She was intubated.  Flexible fiberoptic bronchoscopy was performed via the endotracheal tube.  It revealed normal endobronchial anatomy.  There were no endobronchial lesions seen. The carina  was relatively wide, but otherwise the endobronchial examination was completely unremarkable.  The endobronchial ultrasound probe was passed and inspection of the mediastinal lymph node stations revealed markedly enlarged 4R and 7 lymph nodes.  Aspirations were performed.  Using ultrasound guidance on the level 7 node, 3 separate aspirations were performed. Two separate aspirations of the level 4R node were performed.  These were sent for quick prep.  A portion of each specimen was also sent in cytolite for cell block analysis.  The specimens were sent down for evaluation by Pathology.  While awaiting the specimen, additional samples were taken from the level 7 nodes to have additional cells for the cell block in case tumor markers were needed.  There was minimal bleeding from the aspirations.  The quick prep subsequently returned with malignant cells. The pathologist favored small cell, but definitive diagnosis will await further testing.  Additional aspirations were performed of the 7-node again to provide more cells for testing.  A final inspection was made for hemostasis.  The bronchoscope was withdrawn.  The patient was extubated in the operating room and taken to the postanesthetic care unit in good condition.     Salvatore Decent Dorris Fetch, M.D.     SCH/MEDQ  D:  08/04/2013  T:  08/05/2013  Job:  [id number]

## 2013-08-06 ENCOUNTER — Encounter (HOSPITAL_COMMUNITY): Admission: RE | Payer: Self-pay | Source: Ambulatory Visit

## 2013-08-06 ENCOUNTER — Telehealth: Payer: Self-pay | Admitting: Internal Medicine

## 2013-08-06 ENCOUNTER — Ambulatory Visit (HOSPITAL_BASED_OUTPATIENT_CLINIC_OR_DEPARTMENT_OTHER): Payer: Medicare Other | Admitting: Internal Medicine

## 2013-08-06 ENCOUNTER — Encounter (HOSPITAL_COMMUNITY): Payer: Self-pay | Admitting: Thoracic Surgery (Cardiothoracic Vascular Surgery)

## 2013-08-06 ENCOUNTER — Encounter: Payer: Self-pay | Admitting: *Deleted

## 2013-08-06 ENCOUNTER — Ambulatory Visit (HOSPITAL_COMMUNITY): Admission: RE | Admit: 2013-08-06 | Payer: Medicare Other | Source: Ambulatory Visit | Admitting: Emergency Medicine

## 2013-08-06 ENCOUNTER — Encounter: Payer: Self-pay | Admitting: Internal Medicine

## 2013-08-06 ENCOUNTER — Ambulatory Visit: Payer: Medicare Other

## 2013-08-06 VITALS — BP 132/71 | HR 97 | Temp 97.8°F | Resp 20 | Ht 66.25 in | Wt 186.0 lb

## 2013-08-06 DIAGNOSIS — C3491 Malignant neoplasm of unspecified part of right bronchus or lung: Secondary | ICD-10-CM

## 2013-08-06 DIAGNOSIS — C3490 Malignant neoplasm of unspecified part of unspecified bronchus or lung: Secondary | ICD-10-CM | POA: Insufficient documentation

## 2013-08-06 DIAGNOSIS — Z87891 Personal history of nicotine dependence: Secondary | ICD-10-CM | POA: Diagnosis not present

## 2013-08-06 DIAGNOSIS — R911 Solitary pulmonary nodule: Secondary | ICD-10-CM | POA: Diagnosis not present

## 2013-08-06 SURGERY — BRONCHOSCOPY, WITH EBUS
Anesthesia: General

## 2013-08-06 NOTE — Progress Notes (Signed)
Checked new patient. No financial issue and she has her appt card.

## 2013-08-06 NOTE — Telephone Encounter (Signed)
Gave pt appt for lab,md, injection and chemo class, emailed Marcelino Duster regarding chemo

## 2013-08-06 NOTE — Progress Notes (Signed)
Spoke with pt and family today at [facility].  Gave and briefly explained information on lung cancer.

## 2013-08-06 NOTE — Progress Notes (Signed)
[facility] [phone]   [phone]  CONSULT NOTE  REFERRING PHYSICIAN: Dr. Charlett Lango  REASON FOR CONSULTATION:  79 years old white female recently diagnosed with lung cancer.  HPI Elizabeth Humphrey is a 79 y.o. female with past medical history significant for COPD, dyslipidemia, allergy and long history of smoking but quit in 2000. The patient mentions that she was seen by Dr. Jacky Kindle, her primary care physician for routine annual physical exam. Chest x-ray performed at that time in his office as part of her routine evaluation showed a questionable lesion in the right lung. This was followed by CT scan of the chest without contrast performed at [facility] pelvis on 07/11/2013 and it showed approximately 3.2 x 2.8 x 3.0 CM relatively well circumscribed mass in the right middle lobe. There are 3 CM right hilar and oh CM subcarinal lymph nodes. There were also smaller nodes in the mediastinum. The patient was referred to Dr. Dorris Fetch and a PET scan was performed on 07/25/2013. It showed hypermetabolic right middle lobe mass consistent with primary lung neoplasm. There was hypermetabolism seen in the lateral right hilar and mediastinal lymph node in addition to a subcentimeter nodule in the left upper lobe with malignant range FDG uptake. There was also indeterminate filling defect within the low rectum but the patient had colonoscopy last year and there was no evidence of malignancy or polyps in that area except for some hemorrhoids.  On 08/01/2013 the patient underwent bronchoscopy and endobronchial ultrasound with mediastinal lymph node aspirations under the care of Dr. Dorris Fetch. The final pathology (Accession: [id number]) showed malignant cells consistent with small cell carcinoma. Dr. Dorris Fetch kindly referred the patient to me today for further evaluation and recommendation regarding treatment of her condition.  When seen today she is feeling  fine with no specific complaints. The patient denied having any significant weakness and fatigue. She denied having any chest pain but continues to have mild cough with no hemoptysis. She has no shortness of breath. The patient denied having any significant weight loss or night sweats. The patient denied having any headache or visual changes. Family history significant for her mother and father with heart disease. Her daughter was diagnosed with Hodgkin's lymphoma. The patient is married and has 2 children. She was accompanied today by her husband Claretha Cooper and her daughter Elizabeth Humphrey. She used to work in Nurse, learning disability. She has a history of smoking one pack per day for around 50 years but quit in 2000. She also drinks alcohol every night with no history of drug abuse.  HPI  Past Medical History  Diagnosis Date  . Allergy     bee stings/anaphylaxis  . Hyperlipidemia     hx of  . Lung cancer   . COPD (chronic obstructive pulmonary disease) 07/30/13  . Heart murmur   . History of pneumonia   . Arthritis     Past Surgical History  Procedure Laterality Date  . Dilation and curettage of uterus  yrs ago  . Tonsillectomy    . Cataract extraction      both eyes  . Video bronchoscopy with endobronchial ultrasound N/A 08/04/2013    Procedure: VIDEO BRONCHOSCOPY WITH ENDOBRONCHIAL ULTRASOUND;  Surgeon: Loreli Slot, MD;  Location: [facility] OR;  Service: Thoracic;  Laterality: N/A;    Family History  Problem Relation Age of Onset  . Heart disease Mother   . Heart disease Father     Social History History  Substance Use  Topics  . Smoking status: Former Smoker -- 0.50 packs/day for 50 years    Types: Cigarettes    Quit date: 09/12/1991  . Smokeless tobacco: Never Used  . Alcohol Use: 3.5 oz/week    7 drink(s) per week     Comment: Vodka daily    Allergies  Allergen Reactions  . Neosporin [Neomycin-Bacitracin Zn-Polymyx] Rash    Current Outpatient Prescriptions  Medication Sig  Dispense Refill  . aspirin 81 MG tablet Take 81 mg by mouth daily.      . mupirocin ointment (BACTROBAN) 2 % Apply 1 application topically as needed. For cuts and scrapes      . naproxen sodium (ANAPROX) 220 MG tablet Take 440 mg by mouth daily.       . simvastatin (ZOCOR) 40 MG tablet Take 40 mg by mouth at bedtime.      . traMADol (ULTRAM) 50 MG tablet Take 1 tablet by mouth daily.        No current facility-administered medications for this visit.    Review of Systems  Constitutional: negative Eyes: negative Ears, nose, mouth, throat, and face: negative Respiratory: positive for cough Cardiovascular: negative Gastrointestinal: negative Genitourinary:negative Integument/breast: negative Hematologic/lymphatic: negative Musculoskeletal:negative Neurological: negative Behavioral/Psych: negative Endocrine: negative Allergic/Immunologic: negative  Physical Exam  QMV:HQION, healthy, no distress, well nourished and well developed SKIN: skin color, texture, turgor are normal, no rashes or significant lesions HEAD: Normocephalic, No masses, lesions, tenderness or abnormalities EYES: normal, PERRLA EARS: External ears normal, Canals clear OROPHARYNX:no exudate, no erythema and lips, buccal mucosa, and tongue normal  NECK: supple, no adenopathy LYMPH:  no palpable lymphadenopathy, no hepatosplenomegaly BREAST:not examined LUNGS: clear to auscultation , and palpation HEART: regular rate & rhythm and no murmurs ABDOMEN:abdomen soft, non-tender, normal bowel sounds and no masses or organomegaly BACK: Back symmetric, no curvature., No CVA tenderness EXTREMITIES:no joint deformities, effusion, or inflammation, no edema, no skin discoloration  NEURO: alert & oriented x 3 with fluent speech, no focal motor/sensory deficits  PERFORMANCE STATUS: ECOG 1  LABORATORY DATA: Lab Results  Component Value Date   WBC 6.7 07/31/2013   HGB 13.4 07/31/2013   HCT 40.0 07/31/2013   MCV 94.8  07/31/2013   PLT 190 07/31/2013      Chemistry      Component Value Date/Time   NA 142 07/31/2013 1300   K 4.1 07/31/2013 1300   CL 105 07/31/2013 1300   CO2 30 07/31/2013 1300   BUN 23 07/31/2013 1300   CREATININE 0.76 07/31/2013 1300      Component Value Date/Time   CALCIUM 9.4 07/31/2013 1300   ALKPHOS 60 07/31/2013 1300   AST 21 07/31/2013 1300   ALT 20 07/31/2013 1300   BILITOT 0.4 07/31/2013 1300       RADIOGRAPHIC STUDIES: Dg Chest 2 View Within Previous 72 Hours.  Films Obtained On Friday Are Acceptable For Monday And Tuesday Cases  08/04/2013   CLINICAL DATA:  Lung cancer.  EXAM: CHEST  2 VIEW  COMPARISON:  PET-CT 07/24/2013.  FINDINGS: Mediastinum is unremarkable. Right hilar bullous noted consistent with adenopathy. Right middle lobe mass again noted consistent with malignancy. This was PET positive on prior PET-CT. No focal infiltrate to suggest pneumonia. No pleural effusion or pneumothorax. Heart size and pulmonary vascularity normal. No acute osseous abnormality.  IMPRESSION: 1. Persistent mass lesion in the right middle lobe. Associated persistent right hilar adenopathy. These findings were PET positive on prior PET-CT of 07/24/2013. These findings consistent with patient's known diagnosis  of lung cancer. 2. No acute abnormality .   Electronically Signed   By: Maisie Fus  Register   On: 08/04/2013 10:22   Nm Pet Image Initial (pi) Skull Base To Thigh  07/24/2013   CLINICAL DATA:  Initial treatment strategy for lung mass.  EXAM: NUCLEAR MEDICINE PET SKULL BASE TO THIGH  FASTING BLOOD GLUCOSE:  Value: 112mg /dl  TECHNIQUE: 45.4 mCi U-98 FDG was injected intravenously. CT data was obtained and used for attenuation correction and anatomic localization only. (This was not acquired as a diagnostic CT examination.) Additional exam technical data entered on technologist worksheet.  COMPARISON:  07/11/2013  FINDINGS: NECK  No hypermetabolic lymph nodes in the neck.  CHEST  Right  middle lobe lung mass measures 3.5 cm and has an SUV max equal to 26.7, image 106. Enlarged right hilar lymph node measures 3.1 cm and has an SUV max equal to 18.2, image 95. Enlarged and hypermetabolic right paratracheal and sub- carinal lymph nodes identified. The sub- carinal lymph node measures 2.5 cm and has an SUV max equal to 13.6, image 92. Within the left upper lobe medially there is a tiny nodule measuring 4 mm. There is mild increased uptake associated with this nodule within SUV max equal to 4.3.  ABDOMEN/PELVIS  No abnormal hypermetabolic activity within the liver, pancreas, adrenal glands, or spleen. No hypermetabolic lymph nodes in the abdomen or pelvis. There is an indeterminate filling defect within the lower portion of the rectum. This measures 3.0 cm, image 233/series 2. Increased FDG uptake within this area is identified. Calcified atherosclerotic disease affects the abdominal aorta.  SKELETON  No focal hypermetabolic activity to suggest skeletal metastasis.  IMPRESSION: 1. Hypermetabolic right middle lobe mass consistent with primary lung neoplasm. 2. Hypermetabolic if see lateral hilar and mediastinal lymph node metastasis. 3. Sub cm nodule in the left upper lobe exhibits malignant range FDG uptake. 4. Indeterminate filling defect within the lower rectum. Correlation with colon cancer screening is advised. 5. Calcified atherosclerotic disease.   Electronically Signed   By: Signa Kell M.D.   On: 07/24/2013 15:01    ASSESSMENT: This is a very pleasant 79 years old white female recently diagnosed with small cell lung cancer, limited stage disease but there was a suspicious small nodule in the left upper lobe that needs to be monitored closely.   PLAN: I have a lengthy discussion with the patient and her family today about her current disease stage, prognosis and treatment options. I gave the patient the option of systemic chemotherapy concurrent with radiation versus palliative care. I  explained to the patient that there is 25% overall survival in 5 years with her condition. The patient is interested in proceeding with treatment. I recommended for her regimen of carboplatin for AUC of 5 on day 1 and etoposide 120 mg/M2 on days 1, 2 and 3 with Neulasta support on day 4. This will be concurrent with radiation. I will refer the patient to radiation oncology for discussion of the radiotherapy portion of her treatment. I discussed with the patient adverse effect of the chemotherapy including but not limited to alopecia, myelosuppression, nausea and vomiting, peripheral neuropathy, liver or renal dysfunction. I will complete the staging workup by ordering an MRI of the brain to be performed in the next few days. I will arrange for the patient to have a chemotherapy education class before starting the first cycle of her treatment. I expect the patient to start the first cycle of this treatment on 08/13/2013. She  would have a followup visit on 08/20/2013 for evaluation and management any adverse effect of her treatment. I will call her pharmacy was prescription for Compazine 10 mg by mouth every 6 hours as needed for nausea. The patient was advised to call immediately if she has any concerning symptoms in the interval. I gave the patient and her family the time to ask questions and I answered them completely to their satisfactions  The patient voices understanding of current disease status and treatment options and is in agreement with the current care plan.  All questions were answered. The patient knows to call the clinic with any problems, questions or concerns. We can certainly see the patient much sooner if necessary.  Thank you so much for allowing me to participate in the care of Elizabeth Humphrey. I will continue to follow up the patient with you and assist in her care.  I spent 60 minutes counseling the patient face to face. The total time spent in the appointment was 80  minutes.  Harvir Patry K. 08/06/2013, 3:37 PM

## 2013-08-07 MED ORDER — PROCHLORPERAZINE MALEATE 10 MG PO TABS: 10.0000 mg | ORAL_TABLET | Freq: Four times a day (QID) | ORAL | Status: DC | PRN

## 2013-08-08 ENCOUNTER — Telehealth: Payer: Self-pay | Admitting: *Deleted

## 2013-08-08 ENCOUNTER — Other Ambulatory Visit: Payer: Self-pay | Admitting: Internal Medicine

## 2013-08-08 DIAGNOSIS — C3490 Malignant neoplasm of unspecified part of unspecified bronchus or lung: Secondary | ICD-10-CM

## 2013-08-08 MED ORDER — LORAZEPAM 0.5 MG PO TABS: 0.5000 mg | ORAL_TABLET | ORAL | Status: DC

## 2013-08-08 NOTE — Telephone Encounter (Signed)
Pt's daughter Corrie Dandy called wanting to know if patient could have a sedative before her MRI appt.  Per Dr Donnald Garre, okay to give 0.5mg  lorazepam x 1.   SLJ

## 2013-08-08 NOTE — Telephone Encounter (Signed)
Per staff message and POF I have scheduled appts.  JMW  

## 2013-08-11 ENCOUNTER — Telehealth: Payer: Self-pay | Admitting: Internal Medicine

## 2013-08-11 NOTE — Telephone Encounter (Signed)
Talked to pt and gave her appt for tomorrow, advised pt to get appt tomorrow when she is here, for month of December 2014

## 2013-08-12 ENCOUNTER — Encounter: Payer: Self-pay | Admitting: *Deleted

## 2013-08-12 ENCOUNTER — Other Ambulatory Visit: Payer: Medicare Other

## 2013-08-12 ENCOUNTER — Telehealth: Payer: Self-pay | Admitting: Internal Medicine

## 2013-08-12 NOTE — Telephone Encounter (Signed)
Gave pt appt calendar  for labs , MD , chemo and MRI for December 2014

## 2013-08-13 ENCOUNTER — Ambulatory Visit (HOSPITAL_COMMUNITY)
Admission: RE | Admit: 2013-08-13 | Discharge: 2013-08-13 | Disposition: A | Discharge status: Home / Self Care | Payer: Medicare Other | Source: Ambulatory Visit | Attending: Internal Medicine | Admitting: Internal Medicine

## 2013-08-13 ENCOUNTER — Encounter: Payer: Self-pay | Admitting: *Deleted

## 2013-08-13 ENCOUNTER — Ambulatory Visit (HOSPITAL_BASED_OUTPATIENT_CLINIC_OR_DEPARTMENT_OTHER): Payer: Medicare Other

## 2013-08-13 ENCOUNTER — Other Ambulatory Visit (HOSPITAL_BASED_OUTPATIENT_CLINIC_OR_DEPARTMENT_OTHER): Payer: Medicare Other | Admitting: Lab

## 2013-08-13 VITALS — HR 76 | Temp 98.3°F

## 2013-08-13 DIAGNOSIS — C3491 Malignant neoplasm of unspecified part of right bronchus or lung: Secondary | ICD-10-CM

## 2013-08-13 DIAGNOSIS — C342 Malignant neoplasm of middle lobe, bronchus or lung: Secondary | ICD-10-CM

## 2013-08-13 DIAGNOSIS — Z5111 Encounter for antineoplastic chemotherapy: Secondary | ICD-10-CM | POA: Diagnosis not present

## 2013-08-13 DIAGNOSIS — G319 Degenerative disease of nervous system, unspecified: Secondary | ICD-10-CM | POA: Insufficient documentation

## 2013-08-13 DIAGNOSIS — C3490 Malignant neoplasm of unspecified part of unspecified bronchus or lung: Secondary | ICD-10-CM | POA: Insufficient documentation

## 2013-08-13 LAB — CBC WITH DIFFERENTIAL/PLATELET
EOS%: 1 % (ref 0.0–7.0)
Eosinophils Absolute: 0.1 10*3/uL (ref 0.0–0.5)
LYMPH%: 29.7 % (ref 14.0–49.7)
MCH: 31.3 pg (ref 25.1–34.0)
MCV: 94 fL (ref 79.5–101.0)
MONO%: 7 % (ref 0.0–14.0)
NEUT#: 4.5 10*3/uL (ref 1.5–6.5)
NEUT%: 61.9 % (ref 38.4–76.8)
Platelets: 178 10*3/uL (ref 145–400)
RBC: 4.32 10*6/uL (ref 3.70–5.45)
RDW: 14.1 % (ref 11.2–14.5)

## 2013-08-13 LAB — COMPREHENSIVE METABOLIC PANEL (CC13)
AST: 21 U/L (ref 5–34)
Alkaline Phosphatase: 56 U/L (ref 40–150)
Anion Gap: 9 mEq/L (ref 3–11)
BUN: 16.5 mg/dL (ref 7.0–26.0)
Creatinine: 0.8 mg/dL (ref 0.6–1.1)
Glucose: 110 mg/dl (ref 70–140)
Potassium: 3.6 mEq/L (ref 3.5–5.1)
Sodium: 143 mEq/L (ref 136–145)
Total Bilirubin: 0.3 mg/dL (ref 0.20–1.20)
Total Protein: 7.3 g/dL (ref 6.4–8.3)

## 2013-08-13 MED ORDER — SODIUM CHLORIDE 0.9 % IV SOLN
439.0000 mg | Freq: Once | INTRAVENOUS | Status: AC
  Administered 2013-08-13: 440 mg via INTRAVENOUS
  Filled 2013-08-13: qty 44

## 2013-08-13 MED ORDER — SODIUM CHLORIDE 0.9 % IV SOLN
Freq: Once | INTRAVENOUS | Status: AC
  Administered 2013-08-13: 15:00:00 via INTRAVENOUS

## 2013-08-13 MED ORDER — ONDANSETRON 16 MG/50ML IVPB (CHCC)
16.0000 mg | Freq: Once | INTRAVENOUS | Status: AC
  Administered 2013-08-13: 16 mg via INTRAVENOUS

## 2013-08-13 MED ORDER — SODIUM CHLORIDE 0.9 % IV SOLN
120.0000 mg/m2 | Freq: Once | INTRAVENOUS | Status: AC
  Administered 2013-08-13: 240 mg via INTRAVENOUS
  Filled 2013-08-13: qty 12

## 2013-08-13 MED ORDER — DEXAMETHASONE SODIUM PHOSPHATE 20 MG/5ML IJ SOLN
20.0000 mg | Freq: Once | INTRAMUSCULAR | Status: AC
  Administered 2013-08-13: 20 mg via INTRAVENOUS

## 2013-08-13 MED ORDER — GADOBENATE DIMEGLUMINE 529 MG/ML IV SOLN
20.0000 mL | Freq: Once | INTRAVENOUS | Status: AC | PRN
  Administered 2013-08-13: 20 mL via INTRAVENOUS

## 2013-08-13 MED ORDER — ONDANSETRON 16 MG/50ML IVPB (CHCC)
INTRAVENOUS | Status: AC
  Filled 2013-08-13: qty 16

## 2013-08-13 MED ORDER — DEXAMETHASONE SODIUM PHOSPHATE 20 MG/5ML IJ SOLN
INTRAMUSCULAR | Status: AC
  Filled 2013-08-13: qty 5

## 2013-08-13 NOTE — Progress Notes (Signed)
Thoracic Location of Tumor / Histology:  Right middle lobe mass and small nodule in the left upper lobe   Patient presented for her annual exam with Dr. Jacky Kindle and had a chest x ray which showed a a questionable lesion in the right lung.  Biopsies of (if applicable) revealed:   FINE NEEDLE ASPIRATION: NEEDLE ASPIRATION:P NEEDLE ASPIRATION, EBUS 1, 7 (SPECIMEN 1 OF 4, COLLECTED ON 08/04/13): MALIGNANT CELLS PRESENT, CONSISTENT WITH SMALL CELL CARCINOMA. FINE NEEDLE ASPIRATION: NEEDLE ASPIRATION, EBUS #2, 4R (SPECIMEN 2 OF 4, COLLECTED ON 08/04/13): MALIGNANT CELLS PRESENT, CONSISTENT WITH SMALL CELL CARCINOMA. FINE NEEDLE ASPIRATION: NEEDLE ASPIRATION, EBUS #3, 4R #2 (SPECIMEN 3 OF 4, COLLECTED ON 08/04/13): MALIGNANT CELLS PRESENT, CONSISTENT WITH SMALL CELL CARCINOMA.  Tobacco/Marijuana/Snuff/ETOH use: Former smoker - 0.50 pack per day for 50 years, drinks 1 drink per day ,  Past/Anticipated interventions by cardiothoracic surgery, if any: Procedure: 08/04/13 VIDEO BRONCHOSCOPY WITH ENDOBRONCHIAL ULTRASOUND;  Surgeon: Loreli Slot, MD;  Location: [facility] OR;  Service: Thoracic;  Laterality: N/A;  Past/Anticipated interventions by medical oncology, if any: Per Dr. Arbutus Ped "I recommended for her regimen of carboplatin for AUC of 5 on day 1 and etoposide 120 mg/M2 on days 1, 2 and 3 with Neulasta support on day 4. This will be concurrent with radiation."  Signs/Symptoms  Weight changes, if any: no  Respiratory complaints, if any: dry cough  Hemoptysis, if any: no  Pain issues, if any:  Lower back pain from a disk problem  SAFETY ISSUES:  Prior radiation? no  Pacemaker/ICD? no  Possible current pregnancy? no  Is the patient on methotrexate? no  Current Complaints / other details:  Had first dose of chemotherapy yesterday.  Here with her husband.  Has 2 children.

## 2013-08-13 NOTE — Patient Instructions (Signed)
Etoposide, VP-16 capsules What is this medicine? ETOPOSIDE, VP-16 (e toe POE side) is a chemotherapy drug. It is used to treat small cell lung cancer and other cancers. This medicine may be used for other purposes; ask your health care provider or pharmacist if you have questions. COMMON BRAND NAME(S): VePesid What should I tell my health care provider before I take this medicine? They need to know if you have any of these conditions: -infection -kidney disease -low blood counts, like low white cell, platelet, or red cell counts -an unusual or allergic reaction to etoposide, other chemotherapeutic agents, other medicines, foods, dyes, or preservatives -pregnant or trying to get pregnant -breast-feeding How should I use this medicine? Take this medicine by mouth with a glass of water. Follow the directions on the prescription label. Do not open, crush, or chew the capsules. It is advisable to wear gloves when handling this medicine. Take your medicine at regular intervals. Do not take it more often than directed. Do not stop taking except on your doctor's advice. Talk to your pediatrician regarding the use of this medicine in children. Special care may be needed. Overdosage: If you think you have taken too much of this medicine contact a poison control center or emergency room at once. NOTE: This medicine is only for you. Do not share this medicine with others. What if I miss a dose? If you miss a dose, take it as soon as you can. If it is almost time for your next dose, take only that dose. Do not take double or extra doses. What may interact with this medicine? -cyclosporine -medicines to increase blood counts like filgrastim, pegfilgrastim, sargramostim -vaccines This list may not describe all possible interactions. Give your health care provider a list of all the medicines, herbs, non-prescription drugs, or dietary supplements you use. Also tell them if you smoke, drink alcohol, or use  illegal drugs. Some items may interact with your medicine. What should I watch for while using this medicine? Visit your doctor for checks on your progress. This drug may make you feel generally unwell. This is not uncommon, as chemotherapy can affect healthy cells as well as cancer cells. Report any side effects. Continue your course of treatment even though you feel ill unless your doctor tells you to stop. In some cases, you may be given additional medicines to help with side effects. Follow all directions for their use. Call your doctor or health care professional for advice if you get a fever, chills or sore throat, or other symptoms of a cold or flu. Do not treat yourself. This drug decreases your body's ability to fight infections. Try to avoid being around people who are sick. This medicine may increase your risk to bruise or bleed. Call your doctor or health care professional if you notice any unusual bleeding. Be careful brushing and flossing your teeth or using a toothpick because you may get an infection or bleed more easily. If you have any dental work done, tell your dentist you are receiving this medicine. Avoid taking products that contain aspirin, acetaminophen, ibuprofen, naproxen, or ketoprofen unless instructed by your doctor. These medicines may hide a fever. Do not become pregnant while taking this medicine. Women should inform their doctor if they wish to become pregnant or think they might be pregnant. There is a potential for serious side effects to an unborn child. Talk to your health care professional or pharmacist for more information. Do not breast-feed an infant while taking this medicine.  What side effects may I notice from receiving this medicine? Side effects that you should report to your doctor or health care professional as soon as possible: -allergic reactions like skin rash, itching or hives, swelling of the face, lips, or tongue -low blood counts - this medicine may  decrease the number of white blood cells, red blood cells and platelets. You may be at increased risk for infections and bleeding. -signs of infection - fever or chills, cough, sore throat, pain or difficulty passing urine -signs of decreased platelets or bleeding - bruising, pinpoint red spots on the skin, black, tarry stools, blood in the urine -signs of decreased red blood cells - unusually weak or tired, fainting spells, lightheadedness -breathing problems -changes in vision -mouth or throat sores or ulcers -pain, tingling, numbness in the hands or feet -redness, blistering, peeling or loosening of the skin, including inside the mouth -seizures -vomiting Side effects that usually do not require medical attention (report to your doctor or health care professional if they continue or are bothersome): -change in taste -diarrhea -hair loss -nausea -stomach pain This list may not describe all possible side effects. Call your doctor for medical advice about side effects. You may report side effects to FDA at 1-800-FDA-1088. Where should I keep my medicine? Keep out of the reach of children. Store in a refrigerator between 2 and 8 degrees C (36 and 46 degrees F). Do not freeze. Throw away any unused medicine after the expiration date. NOTE: This sheet is a summary. It may not cover all possible information. If you have questions about this medicine, talk to your doctor, pharmacist, or health care provider.  2014, [employer]/Gold Standard. (2007-12-30 17:22:30) Carboplatin injection What is this medicine? CARBOPLATIN (KAR boe pla tin) is a chemotherapy drug. It targets fast dividing cells, like cancer cells, and causes these cells to die. This medicine is used to treat ovarian cancer and many other cancers. This medicine may be used for other purposes; ask your health care provider or pharmacist if you have questions. COMMON BRAND NAME(S): Paraplatin What should I tell my health care provider  before I take this medicine? They need to know if you have any of these conditions: -blood disorders -hearing problems -kidney disease -recent or ongoing radiation therapy -an unusual or allergic reaction to carboplatin, cisplatin, other chemotherapy, other medicines, foods, dyes, or preservatives -pregnant or trying to get pregnant -breast-feeding How should I use this medicine? This drug is usually given as an infusion into a vein. It is administered in a hospital or clinic by a specially trained health care professional. Talk to your pediatrician regarding the use of this medicine in children. Special care may be needed. Overdosage: If you think you have taken too much of this medicine contact a poison control center or emergency room at once. NOTE: This medicine is only for you. Do not share this medicine with others. What if I miss a dose? It is important not to miss a dose. Call your doctor or health care professional if you are unable to keep an appointment. What may interact with this medicine? -medicines for seizures -medicines to increase blood counts like filgrastim, pegfilgrastim, sargramostim -some antibiotics like amikacin, gentamicin, neomycin, streptomycin, tobramycin -vaccines Talk to your doctor or health care professional before taking any of these medicines: -acetaminophen -aspirin -ibuprofen -ketoprofen -naproxen This list may not describe all possible interactions. Give your health care provider a list of all the medicines, herbs, non-prescription drugs, or dietary supplements you use. Also  tell them if you smoke, drink alcohol, or use illegal drugs. Some items may interact with your medicine. What should I watch for while using this medicine? Your condition will be monitored carefully while you are receiving this medicine. You will need important blood work done while you are taking this medicine. This drug may make you feel generally unwell. This is not uncommon,  as chemotherapy can affect healthy cells as well as cancer cells. Report any side effects. Continue your course of treatment even though you feel ill unless your doctor tells you to stop. In some cases, you may be given additional medicines to help with side effects. Follow all directions for their use. Call your doctor or health care professional for advice if you get a fever, chills or sore throat, or other symptoms of a cold or flu. Do not treat yourself. This drug decreases your body's ability to fight infections. Try to avoid being around people who are sick. This medicine may increase your risk to bruise or bleed. Call your doctor or health care professional if you notice any unusual bleeding. Be careful brushing and flossing your teeth or using a toothpick because you may get an infection or bleed more easily. If you have any dental work done, tell your dentist you are receiving this medicine. Avoid taking products that contain aspirin, acetaminophen, ibuprofen, naproxen, or ketoprofen unless instructed by your doctor. These medicines may hide a fever. Do not become pregnant while taking this medicine. Women should inform their doctor if they wish to become pregnant or think they might be pregnant. There is a potential for serious side effects to an unborn child. Talk to your health care professional or pharmacist for more information. Do not breast-feed an infant while taking this medicine. What side effects may I notice from receiving this medicine? Side effects that you should report to your doctor or health care professional as soon as possible: -allergic reactions like skin rash, itching or hives, swelling of the face, lips, or tongue -signs of infection - fever or chills, cough, sore throat, pain or difficulty passing urine -signs of decreased platelets or bleeding - bruising, pinpoint red spots on the skin, black, tarry stools, nosebleeds -signs of decreased red blood cells - unusually weak  or tired, fainting spells, lightheadedness -breathing problems -changes in hearing -changes in vision -chest pain -high blood pressure -low blood counts - This drug may decrease the number of white blood cells, red blood cells and platelets. You may be at increased risk for infections and bleeding. -nausea and vomiting -pain, swelling, redness or irritation at the injection site -pain, tingling, numbness in the hands or feet -problems with balance, talking, walking -trouble passing urine or change in the amount of urine Side effects that usually do not require medical attention (report to your doctor or health care professional if they continue or are bothersome): -hair loss -loss of appetite -metallic taste in the mouth or changes in taste This list may not describe all possible side effects. Call your doctor for medical advice about side effects. You may report side effects to FDA at 1-800-FDA-1088. Where should I keep my medicine? This drug is given in a hospital or clinic and will not be stored at home. NOTE: This sheet is a summary. It may not cover all possible information. If you have questions about this medicine, talk to your doctor, pharmacist, or health care provider.  2014, [employer]/Gold Standard. (2007-12-03 14:38:05) [facility] Discharge Instructions for Patients Receiving  Chemotherapy  Today you received the following chemotherapy agents :  Carboplatin & VP-16 (etoposide)  To help prevent nausea and vomiting after your treatment, we encourage you to take your nausea medication.  If you develop nausea and vomiting that is not controlled by your nausea medication, call the clinic.   BELOW ARE SYMPTOMS THAT SHOULD BE REPORTED IMMEDIATELY:  *FEVER GREATER THAN 100.5 F  *CHILLS WITH OR WITHOUT FEVER  NAUSEA AND VOMITING THAT IS NOT CONTROLLED WITH YOUR NAUSEA MEDICATION  *UNUSUAL SHORTNESS OF BREATH  *UNUSUAL BRUISING OR BLEEDING  TENDERNESS IN MOUTH  AND THROAT WITH OR WITHOUT PRESENCE OF ULCERS  *URINARY PROBLEMS  *BOWEL PROBLEMS  UNUSUAL RASH Items with * indicate a potential emergency and should be followed up as soon as possible.  Feel free to call the clinic you have any questions or concerns. The clinic phone number is [phone].

## 2013-08-14 ENCOUNTER — Ambulatory Visit: Payer: Medicare Other | Admitting: Pulmonary Disease

## 2013-08-14 ENCOUNTER — Ambulatory Visit
Admission: RE | Admit: 2013-08-14 | Discharge: 2013-08-14 | Disposition: A | Discharge status: Home / Self Care | Payer: Medicare Other | Source: Ambulatory Visit | Attending: Radiation Oncology | Admitting: Radiation Oncology

## 2013-08-14 ENCOUNTER — Other Ambulatory Visit: Payer: Self-pay | Admitting: Medical Oncology

## 2013-08-14 ENCOUNTER — Encounter: Payer: Self-pay | Admitting: Radiation Oncology

## 2013-08-14 ENCOUNTER — Ambulatory Visit (HOSPITAL_BASED_OUTPATIENT_CLINIC_OR_DEPARTMENT_OTHER): Payer: Medicare Other

## 2013-08-14 ENCOUNTER — Telehealth: Payer: Self-pay | Admitting: Medical Oncology

## 2013-08-14 VITALS — BP 132/68 | HR 89 | Temp 97.6°F | Ht 66.25 in | Wt 189.5 lb

## 2013-08-14 VITALS — BP 132/51 | HR 78 | Temp 98.6°F | Resp 20

## 2013-08-14 DIAGNOSIS — Z5111 Encounter for antineoplastic chemotherapy: Secondary | ICD-10-CM

## 2013-08-14 DIAGNOSIS — J449 Chronic obstructive pulmonary disease, unspecified: Secondary | ICD-10-CM | POA: Diagnosis not present

## 2013-08-14 DIAGNOSIS — C3490 Malignant neoplasm of unspecified part of unspecified bronchus or lung: Secondary | ICD-10-CM

## 2013-08-14 DIAGNOSIS — C342 Malignant neoplasm of middle lobe, bronchus or lung: Secondary | ICD-10-CM | POA: Diagnosis not present

## 2013-08-14 DIAGNOSIS — E785 Hyperlipidemia, unspecified: Secondary | ICD-10-CM | POA: Diagnosis not present

## 2013-08-14 DIAGNOSIS — C3491 Malignant neoplasm of unspecified part of right bronchus or lung: Secondary | ICD-10-CM

## 2013-08-14 DIAGNOSIS — J4489 Other specified chronic obstructive pulmonary disease: Secondary | ICD-10-CM | POA: Insufficient documentation

## 2013-08-14 MED ORDER — SODIUM CHLORIDE 0.9 % IV SOLN
120.0000 mg/m2 | Freq: Once | INTRAVENOUS | Status: AC
  Administered 2013-08-14: 240 mg via INTRAVENOUS
  Filled 2013-08-14: qty 12

## 2013-08-14 MED ORDER — ONDANSETRON 8 MG/NS 50 ML IVPB
INTRAVENOUS | Status: AC
  Filled 2013-08-14: qty 8

## 2013-08-14 MED ORDER — SODIUM CHLORIDE 0.9 % IV SOLN
Freq: Once | INTRAVENOUS | Status: AC
  Administered 2013-08-14: 10:00:00 via INTRAVENOUS

## 2013-08-14 MED ORDER — DEXAMETHASONE SODIUM PHOSPHATE 10 MG/ML IJ SOLN
10.0000 mg | Freq: Once | INTRAMUSCULAR | Status: AC
  Administered 2013-08-14: 10 mg via INTRAVENOUS

## 2013-08-14 MED ORDER — DEXAMETHASONE SODIUM PHOSPHATE 10 MG/ML IJ SOLN
INTRAMUSCULAR | Status: AC
  Filled 2013-08-14: qty 1

## 2013-08-14 MED ORDER — ONDANSETRON 8 MG/50ML IVPB (CHCC)
8.0000 mg | Freq: Once | INTRAVENOUS | Status: AC
  Administered 2013-08-14: 8 mg via INTRAVENOUS

## 2013-08-14 NOTE — Telephone Encounter (Signed)
I returned call about results of MRI . I called and left message on home phone to return my call.

## 2013-08-14 NOTE — Progress Notes (Signed)
Radiation Oncology         [phone] ________________________________  Initial outpatient Consultation  Name: Elizabeth Humphrey MRN: [id number]  Date: 08/14/2013  DOB: 16-Jul-1934  WU:[id number],NWGNFAO A, MD  Si Gaul, MD   REFERRING PHYSICIAN: Si Gaul, MD  DIAGNOSIS: Limited stage small cell lung cancer  HISTORY OF PRESENT ILLNESS::Elizabeth Humphrey is a 79 y.o. female who is seen out of the courtesy of Dr. Arbutus Ped for an opinion concerning radiation therapy as part of management of patient's recently diagnosed small cell lung cancer. Earlier this year the patient underwent her routine annual physical exam.   As part of this evaluation a chest x-ray was performed which revealed a questionable mass in the right lung. A subsequent CT scan of the chest showed a 3.2 x 2.8 x 3.0 cm well-circumscribed mass in the right middle lobe. In addition there was right hilar and subcarinal adenopathy. Patient was referred to Dr. Dorris Fetch and a PET scan was ordered and showed activity in the right middle lobe mass as well as the right hilar region and mediastinal region. In addition there was a subcentimeter nodule in the left upper lobe which showed mild FDG uptake. The patient underwent bronchoscopy and endobronchial ultrasound and mediastinal lymph node aspiration under  the direction of Dr. Dorris Fetch.  pathology from this procedure revealed small cell cancer. The patient was referred to Dr. Arbutus Ped and MRI the brain was ordered which showed no evidence of brain metastasis. The patient started her chemotherapy yesterday and will be treated with carboplatin and etoposide.  Patient is now seen in radiation oncology for consideration for chest radiation.  PREVIOUS RADIATION THERAPY: No  PAST MEDICAL HISTORY:  has a past medical history of Allergy; Hyperlipidemia; Lung cancer; COPD (chronic obstructive pulmonary disease) (07/30/13); Heart murmur; History of pneumonia; and Arthritis.    PAST  SURGICAL HISTORY: Past Surgical History  Procedure Laterality Date  . Dilation and curettage of uterus  yrs ago  . Tonsillectomy    . Cataract extraction      both eyes  . Video bronchoscopy with endobronchial ultrasound N/A 08/04/2013    Procedure: VIDEO BRONCHOSCOPY WITH ENDOBRONCHIAL ULTRASOUND;  Surgeon: Loreli Slot, MD;  Location: [facility] OR;  Service: Thoracic;  Laterality: N/A;    FAMILY HISTORY: family history includes Heart disease in her father and mother.  SOCIAL HISTORY:  reports that she quit smoking about 21 years ago. Her smoking use included Cigarettes. She has a 25 pack-year smoking history. She has never used smokeless tobacco. She reports that she drinks about 3.5 ounces of alcohol per week. She reports that she does not use illicit drugs.  ALLERGIES: Neosporin  MEDICATIONS:  Current Outpatient Prescriptions  Medication Sig Dispense Refill  . aspirin 81 MG tablet Take 81 mg by mouth daily.      [name] LORazepam (ATIVAN) 0.5 MG tablet Take 1 tablet (0.5 mg total) by mouth as directed. 0.5mg  tablet, take prior to MRI.  1 tablet  0  . naproxen sodium (ANAPROX) 220 MG tablet Take 440 mg by mouth daily.       . prochlorperazine (COMPAZINE) 10 MG tablet Take 1 tablet (10 mg total) by mouth every 6 (six) hours as needed for nausea or vomiting.  60 tablet  0  . simvastatin (ZOCOR) 40 MG tablet Take 40 mg by mouth at bedtime.      . traMADol (ULTRAM) 50 MG tablet Take 1 tablet by mouth daily.       . mupirocin ointment (  BACTROBAN) 2 % Apply 1 application topically as needed. For cuts and scrapes       No current facility-administered medications for this encounter.    REVIEW OF SYSTEMS:  A 15 point review of systems is documented in the electronic medical record. This was obtained by the nursing staff. However, I reviewed this with the patient to discuss relevant findings and make appropriate changes.  Prior to diagnosis the patient denies any cough or breathing problems. She  denies any pain in the chest area or hemoptysis. Patient denies any new bony pain, headaches dizziness or blurred vision. She denies any appetite changes or fatigue. She has an excellent performance status for age of 46   PHYSICAL EXAM:  height is 5' 6.25" (1.683 m) and weight is 189 lb 8 oz (85.957 kg). Her temperature is 97.6 F (36.4 C). Her blood pressure is 132/68 and her pulse is 89. Her oxygen saturation is 98%.   BP 132/68  Pulse 89  Temp(Src) 97.6 F (36.4 C)  Ht 5' 6.25" (1.683 m)  Wt 189 lb 8 oz (85.957 kg)  BMI 30.35 kg/m2  SpO2 98%  General Appearance:    Alert, cooperative, no distress, appears stated age,  accompanied by her husband on evaluation today   Head:    Normocephalic, without obvious abnormality, atraumatic  Eyes:    PERRL, conjunctiva/corneas clear, EOM's intact    Ears:    Normal TM's and external ear canals, both ears  Nose:   Nares normal, septum midline, mucosa normal, no drainage    or sinus tenderness  Throat:   Lips, mucosa, and tongue normal; dentures in place, gums normal  Neck:   Supple, symmetrical, trachea midline, no adenopathy;    thyroid:  no enlargement/tenderness/nodules; no carotid   bruit or JVD  Back:     Symmetric, no curvature, ROM normal, no CVA tenderness  Lungs:     Clear to auscultation bilaterally, respirations unlabored  Chest Wall:    No tenderness or deformity   Heart:    Regular rate and rhythm, S1 and S2 normal, no murmur, rub   or gallop     Abdomen:     Soft, non-tender, bowel sounds active all four quadrants,    no masses, no organomegaly        Extremities:   Extremities normal, atraumatic, no cyanosis or edema  Pulses:   2+ and symmetric all extremities  Skin:   Skin color, texture, turgor normal, no rashes or lesions  Lymph nodes:   Cervical, supraclavicular, and axillary nodes normal  Neurologic:   normal strength     ECOG = 0   LABORATORY DATA:  Lab Results  Component Value Date   WBC 7.3 08/13/2013   HGB  13.5 08/13/2013   HCT 40.6 08/13/2013   MCV 94.0 08/13/2013   PLT 178 08/13/2013   Lab Results  Component Value Date   NA 143 08/13/2013   K 3.6 08/13/2013   CL 105 07/31/2013   CO2 27 08/13/2013   Lab Results  Component Value Date   ALT 19 08/13/2013   AST 21 08/13/2013   ALKPHOS 56 08/13/2013   BILITOT 0.30 08/13/2013     RADIOGRAPHY: Dg Chest 2 View Within Previous 72 Hours.  Films Obtained On Friday Are Acceptable For Monday And Tuesday Cases  08/04/2013   CLINICAL DATA:  Lung cancer.  EXAM: CHEST  2 VIEW  COMPARISON:  PET-CT 07/24/2013.  FINDINGS: Mediastinum is unremarkable. Right hilar bullous noted consistent  with adenopathy. Right middle lobe mass again noted consistent with malignancy. This was PET positive on prior PET-CT. No focal infiltrate to suggest pneumonia. No pleural effusion or pneumothorax. Heart size and pulmonary vascularity normal. No acute osseous abnormality.  IMPRESSION: 1. Persistent mass lesion in the right middle lobe. Associated persistent right hilar adenopathy. These findings were PET positive on prior PET-CT of 07/24/2013. These findings consistent with patient's known diagnosis of lung cancer. 2. No acute abnormality .   Electronically Signed   By: Maisie Fus  Register   On: 08/04/2013 10:22   Mr Laqueta Jean Wo Contrast  08/13/2013   CLINICAL DATA:  Small cell lung cancer.  EXAM: MRI HEAD WITHOUT AND WITH CONTRAST  TECHNIQUE: Multiplanar, multiecho pulse sequences of the brain and surrounding structures were obtained without and with intravenous contrast.  CONTRAST:  20mL MULTIHANCE GADOBENATE DIMEGLUMINE 529 MG/ML IV SOLN  COMPARISON:  None.  FINDINGS: Moderate generalized atrophy is present. Extensive periventricular and subcortical white matter disease is present bilaterally. No acute infarct, hemorrhage, or mass lesion is present. A relatively empty sella is noted.  The postcontrast images demonstrate no pathologic enhancement to suggest metastatic disease of the brain or  meninges.  Flow is present in the major intracranial arteries. The patient is status post bilateral lens extractions. The ventricles are of normal size. No significant extra-axial fluid collection is present. Minimal mucosal thickening is present in the ethmoid air cells. The mastoid air cells are clear.  Note is made of hyperostosis frontalis internus without evidence for osseous metastases.  IMPRESSION: 1. No evidence for metastatic disease of the brain or meninges. 2. Moderate generalized atrophy and diffuse white matter disease. This likely reflects the sequela of chronic microvascular ischemia.   Electronically Signed   By: Gennette Pac M.D.   On: 08/13/2013 21:25   Nm Pet Image Initial (pi) Skull Base To Thigh  07/24/2013   CLINICAL DATA:  Initial treatment strategy for lung mass.  EXAM: NUCLEAR MEDICINE PET SKULL BASE TO THIGH  FASTING BLOOD GLUCOSE:  Value: 112mg /dl  TECHNIQUE: 54.0 mCi J-81 FDG was injected intravenously. CT data was obtained and used for attenuation correction and anatomic localization only. (This was not acquired as a diagnostic CT examination.) Additional exam technical data entered on technologist worksheet.  COMPARISON:  07/11/2013  FINDINGS: NECK  No hypermetabolic lymph nodes in the neck.  CHEST  Right middle lobe lung mass measures 3.5 cm and has an SUV max equal to 26.7, image 106. Enlarged right hilar lymph node measures 3.1 cm and has an SUV max equal to 18.2, image 95. Enlarged and hypermetabolic right paratracheal and sub- carinal lymph nodes identified. The sub- carinal lymph node measures 2.5 cm and has an SUV max equal to 13.6, image 92. Within the left upper lobe medially there is a tiny nodule measuring 4 mm. There is mild increased uptake associated with this nodule within SUV max equal to 4.3.  ABDOMEN/PELVIS  No abnormal hypermetabolic activity within the liver, pancreas, adrenal glands, or spleen. No hypermetabolic lymph nodes in the abdomen or pelvis. There is  an indeterminate filling defect within the lower portion of the rectum. This measures 3.0 cm, image 233/series 2. Increased FDG uptake within this area is identified. Calcified atherosclerotic disease affects the abdominal aorta.  SKELETON  No focal hypermetabolic activity to suggest skeletal metastasis.  IMPRESSION: 1. Hypermetabolic right middle lobe mass consistent with primary lung neoplasm. 2. Hypermetabolic if see lateral hilar and mediastinal lymph node metastasis. 3. Sub cm  nodule in the left upper lobe exhibits malignant range FDG uptake. 4. Indeterminate filling defect within the lower rectum. Correlation with colon cancer screening is advised. 5. Calcified atherosclerotic disease.   Electronically Signed   By: Signa Kell M.D.   On: 07/24/2013 15:01      IMPRESSION: Limited stage small cell lung cancer. The patient has an excellent performance status and would be a good candidate for chest consolidation as part of her management. I am unsure of the etiology of the small nodule in the left upper lobe but may be able to cover this with her chest radiation or later consider SBRT if this lesion becomes more significant.  I discussed the treatment course side effects and potential toxicities of radiation therapy in this situation with the patient and her husband. She appears to understand and wishes to proceed with planned course of treatment. If the patient has complete response to her radiation and chemotherapy then consideration for prophylactic cranial irradiation may be indicated. With the patient's age of 81 however we will have to rethink this issue at a later date given the potential for dementia.  PLAN: Simulation and planning on 08/19/13 with treatments to begin the week of December 15. Anticipate approximately 6 weeks of radiation therapy as part of the patient's management  I spent 60 minutes minutes face to face with the patient and more than 50% of that time was spent in counseling and/or  coordination of care.   ------------------------------------------------  -----------------------------------  Billie Lade, PhD, MD

## 2013-08-14 NOTE — Patient Instructions (Signed)
[facility] Discharge Instructions for Patients Receiving Chemotherapy  Today you received the following chemotherapy agents :  Etoposide.  To help prevent nausea and vomiting after your treatment, we encourage you to take your nausea medication as instructed by your physician.  Take Compazine 10 mg by mouth every 6 hrs as needed for nausea.  DO NOT Drive after taking Compazine as this medication can cause drowsiness.  DO Drink lots of fluids as tolerated.  DO NOT Eat greasy nor spicy foods.   If you develop nausea and vomiting that is not controlled by your nausea medication, call the clinic.   BELOW ARE SYMPTOMS THAT SHOULD BE REPORTED IMMEDIATELY:  *FEVER GREATER THAN 100.5 F  *CHILLS WITH OR WITHOUT FEVER  NAUSEA AND VOMITING THAT IS NOT CONTROLLED WITH YOUR NAUSEA MEDICATION  *UNUSUAL SHORTNESS OF BREATH  *UNUSUAL BRUISING OR BLEEDING  TENDERNESS IN MOUTH AND THROAT WITH OR WITHOUT PRESENCE OF ULCERS  *URINARY PROBLEMS  *BOWEL PROBLEMS  UNUSUAL RASH Items with * indicate a potential emergency and should be followed up as soon as possible.  Feel free to call the clinic you have any questions or concerns. The clinic phone number is [phone].

## 2013-08-14 NOTE — Progress Notes (Signed)
Please see the Nurse Progress Note in the MD Initial Consult Encounter for this patient. 

## 2013-08-15 ENCOUNTER — Ambulatory Visit (HOSPITAL_BASED_OUTPATIENT_CLINIC_OR_DEPARTMENT_OTHER): Payer: Medicare Other

## 2013-08-15 VITALS — BP 130/59 | HR 75 | Temp 98.9°F | Resp 20

## 2013-08-15 DIAGNOSIS — C342 Malignant neoplasm of middle lobe, bronchus or lung: Secondary | ICD-10-CM | POA: Diagnosis not present

## 2013-08-15 DIAGNOSIS — Z5111 Encounter for antineoplastic chemotherapy: Secondary | ICD-10-CM | POA: Diagnosis not present

## 2013-08-15 DIAGNOSIS — C3491 Malignant neoplasm of unspecified part of right bronchus or lung: Secondary | ICD-10-CM

## 2013-08-15 MED ORDER — ONDANSETRON 8 MG/NS 50 ML IVPB
INTRAVENOUS | Status: AC
  Filled 2013-08-15: qty 8

## 2013-08-15 MED ORDER — SODIUM CHLORIDE 0.9 % IV SOLN
Freq: Once | INTRAVENOUS | Status: AC
  Administered 2013-08-15: 09:00:00 via INTRAVENOUS

## 2013-08-15 MED ORDER — DEXAMETHASONE SODIUM PHOSPHATE 10 MG/ML IJ SOLN
INTRAMUSCULAR | Status: AC
  Filled 2013-08-15: qty 1

## 2013-08-15 MED ORDER — DEXAMETHASONE SODIUM PHOSPHATE 10 MG/ML IJ SOLN
10.0000 mg | Freq: Once | INTRAMUSCULAR | Status: AC
  Administered 2013-08-15: 10 mg via INTRAVENOUS

## 2013-08-15 MED ORDER — ONDANSETRON 8 MG/50ML IVPB (CHCC)
8.0000 mg | Freq: Once | INTRAVENOUS | Status: AC
  Administered 2013-08-15: 8 mg via INTRAVENOUS

## 2013-08-15 MED ORDER — SODIUM CHLORIDE 0.9 % IV SOLN
120.0000 mg/m2 | Freq: Once | INTRAVENOUS | Status: AC
  Administered 2013-08-15: 240 mg via INTRAVENOUS
  Filled 2013-08-15: qty 12

## 2013-08-15 NOTE — Patient Instructions (Signed)
[facility] Discharge Instructions for Patients Receiving Chemotherapy  Today you received the following chemotherapy agents Etoposide (VP 16) To help prevent nausea and vomiting after your treatment, we encourage you to take your nausea medication as prescribed.If you develop nausea and vomiting that is not controlled by your nausea medication, call the clinic.   BELOW ARE SYMPTOMS THAT SHOULD BE REPORTED IMMEDIATELY:  *FEVER GREATER THAN 100.5 F  *CHILLS WITH OR WITHOUT FEVER  NAUSEA AND VOMITING THAT IS NOT CONTROLLED WITH YOUR NAUSEA MEDICATION  *UNUSUAL SHORTNESS OF BREATH  *UNUSUAL BRUISING OR BLEEDING  TENDERNESS IN MOUTH AND THROAT WITH OR WITHOUT PRESENCE OF ULCERS  *URINARY PROBLEMS  *BOWEL PROBLEMS  UNUSUAL RASH Items with * indicate a potential emergency and should be followed up as soon as possible.  Feel free to call the clinic you have any questions or concerns. The clinic phone number is [phone].    

## 2013-08-16 ENCOUNTER — Ambulatory Visit (HOSPITAL_BASED_OUTPATIENT_CLINIC_OR_DEPARTMENT_OTHER): Payer: Medicare Other

## 2013-08-16 VITALS — BP 135/50 | HR 71 | Temp 98.6°F | Resp 18

## 2013-08-16 DIAGNOSIS — C3491 Malignant neoplasm of unspecified part of right bronchus or lung: Secondary | ICD-10-CM

## 2013-08-16 DIAGNOSIS — C342 Malignant neoplasm of middle lobe, bronchus or lung: Secondary | ICD-10-CM | POA: Diagnosis not present

## 2013-08-16 MED ORDER — PEGFILGRASTIM INJECTION 6 MG/0.6ML
6.0000 mg | Freq: Once | SUBCUTANEOUS | Status: AC
  Administered 2013-08-16: 6 mg via SUBCUTANEOUS

## 2013-08-18 ENCOUNTER — Telehealth: Payer: Self-pay | Admitting: *Deleted

## 2013-08-18 NOTE — Telephone Encounter (Signed)
No N/V or decrease in appetite. Constipation resolved with stool softener. Voiding well. Small amount of back pain with Neulasta, but is taking the Claritin and takes pain pill prn and this helps. Has no further questions. She will call with any concerns.

## 2013-08-19 ENCOUNTER — Ambulatory Visit
Admission: RE | Admit: 2013-08-19 | Discharge: 2013-08-19 | Disposition: A | Discharge status: Home / Self Care | Payer: Medicare Other | Source: Ambulatory Visit | Attending: Radiation Oncology | Admitting: Radiation Oncology

## 2013-08-19 ENCOUNTER — Encounter (HOSPITAL_COMMUNITY): Payer: Self-pay | Admitting: Thoracic Surgery (Cardiothoracic Vascular Surgery)

## 2013-08-19 DIAGNOSIS — R5381 Other malaise: Secondary | ICD-10-CM | POA: Diagnosis not present

## 2013-08-19 DIAGNOSIS — Z7982 Long term (current) use of aspirin: Secondary | ICD-10-CM | POA: Insufficient documentation

## 2013-08-19 DIAGNOSIS — Z510 Encounter for antineoplastic radiation therapy: Secondary | ICD-10-CM | POA: Insufficient documentation

## 2013-08-19 DIAGNOSIS — J040 Acute laryngitis: Secondary | ICD-10-CM | POA: Diagnosis not present

## 2013-08-19 DIAGNOSIS — R05 Cough: Secondary | ICD-10-CM | POA: Diagnosis not present

## 2013-08-19 DIAGNOSIS — R059 Cough, unspecified: Secondary | ICD-10-CM | POA: Insufficient documentation

## 2013-08-19 DIAGNOSIS — Y842 Radiological procedure and radiotherapy as the cause of abnormal reaction of the patient, or of later complication, without mention of misadventure at the time of the procedure: Secondary | ICD-10-CM | POA: Insufficient documentation

## 2013-08-19 DIAGNOSIS — K209 Esophagitis, unspecified without bleeding: Secondary | ICD-10-CM | POA: Insufficient documentation

## 2013-08-19 DIAGNOSIS — Z79899 Other long term (current) drug therapy: Secondary | ICD-10-CM | POA: Insufficient documentation

## 2013-08-19 DIAGNOSIS — C3490 Malignant neoplasm of unspecified part of unspecified bronchus or lung: Secondary | ICD-10-CM | POA: Insufficient documentation

## 2013-08-19 DIAGNOSIS — L589 Radiodermatitis, unspecified: Secondary | ICD-10-CM | POA: Diagnosis not present

## 2013-08-19 DIAGNOSIS — C342 Malignant neoplasm of middle lobe, bronchus or lung: Secondary | ICD-10-CM | POA: Diagnosis not present

## 2013-08-20 ENCOUNTER — Other Ambulatory Visit (HOSPITAL_BASED_OUTPATIENT_CLINIC_OR_DEPARTMENT_OTHER): Payer: Medicare Other

## 2013-08-20 DIAGNOSIS — C342 Malignant neoplasm of middle lobe, bronchus or lung: Secondary | ICD-10-CM

## 2013-08-20 DIAGNOSIS — C3491 Malignant neoplasm of unspecified part of right bronchus or lung: Secondary | ICD-10-CM

## 2013-08-20 LAB — CBC WITH DIFFERENTIAL/PLATELET
Basophils Absolute: 0 10*3/uL (ref 0.0–0.1)
Eosinophils Absolute: 0 10*3/uL (ref 0.0–0.5)
HGB: 13.3 g/dL (ref 11.6–15.9)
MCH: 31.1 pg (ref 25.1–34.0)
MCV: 92.3 fL (ref 79.5–101.0)
MONO#: 0 10*3/uL — ABNORMAL LOW (ref 0.1–0.9)
MONO%: 0.5 % (ref 0.0–14.0)
NEUT#: 0.3 10*3/uL — CL (ref 1.5–6.5)
Platelets: 100 10*3/uL — ABNORMAL LOW (ref 145–400)
RBC: 4.27 10*6/uL (ref 3.70–5.45)
RDW: 13.6 % (ref 11.2–14.5)
WBC: 1.9 10*3/uL — ABNORMAL LOW (ref 3.9–10.3)
nRBC: 0 % (ref 0–0)

## 2013-08-20 LAB — COMPREHENSIVE METABOLIC PANEL (CC13)
ALT: 24 U/L (ref 0–55)
Albumin: 4 g/dL (ref 3.5–5.0)
Anion Gap: 12 mEq/L — ABNORMAL HIGH (ref 3–11)
CO2: 25 mEq/L (ref 22–29)
Calcium: 9.6 mg/dL (ref 8.4–10.4)
Chloride: 102 mEq/L (ref 98–109)
Glucose: 132 mg/dl (ref 70–140)
Potassium: 3.9 mEq/L (ref 3.5–5.1)
Sodium: 140 mEq/L (ref 136–145)
Total Protein: 7.4 g/dL (ref 6.4–8.3)

## 2013-08-20 NOTE — Progress Notes (Signed)
Quick Note:  Call patient with the result and give neutropenic precautions. ______ 

## 2013-08-21 DIAGNOSIS — Z510 Encounter for antineoplastic radiation therapy: Secondary | ICD-10-CM | POA: Diagnosis not present

## 2013-08-21 DIAGNOSIS — C342 Malignant neoplasm of middle lobe, bronchus or lung: Secondary | ICD-10-CM | POA: Diagnosis not present

## 2013-08-21 DIAGNOSIS — C3490 Malignant neoplasm of unspecified part of unspecified bronchus or lung: Secondary | ICD-10-CM | POA: Diagnosis not present

## 2013-08-21 DIAGNOSIS — L589 Radiodermatitis, unspecified: Secondary | ICD-10-CM | POA: Diagnosis not present

## 2013-08-21 DIAGNOSIS — R05 Cough: Secondary | ICD-10-CM | POA: Diagnosis not present

## 2013-08-21 DIAGNOSIS — J040 Acute laryngitis: Secondary | ICD-10-CM | POA: Diagnosis not present

## 2013-08-22 ENCOUNTER — Encounter: Payer: Self-pay | Admitting: *Deleted

## 2013-08-22 NOTE — Progress Notes (Signed)
Pt called requesting prescription for a wig.  I spoke with Dr. Arbutus Ped and he wrote prescription.  I called pt back to notify she can come to cancer center to pick up.  I put in envelope at front desk for pt to pick up.

## 2013-08-24 DIAGNOSIS — Z510 Encounter for antineoplastic radiation therapy: Secondary | ICD-10-CM | POA: Diagnosis not present

## 2013-08-24 DIAGNOSIS — R05 Cough: Secondary | ICD-10-CM | POA: Diagnosis not present

## 2013-08-24 DIAGNOSIS — J040 Acute laryngitis: Secondary | ICD-10-CM | POA: Diagnosis not present

## 2013-08-24 DIAGNOSIS — L589 Radiodermatitis, unspecified: Secondary | ICD-10-CM | POA: Diagnosis not present

## 2013-08-24 DIAGNOSIS — C3490 Malignant neoplasm of unspecified part of unspecified bronchus or lung: Secondary | ICD-10-CM | POA: Diagnosis not present

## 2013-08-25 ENCOUNTER — Telehealth: Payer: Self-pay | Admitting: *Deleted

## 2013-08-25 NOTE — Telephone Encounter (Signed)
Message copied by Raphael Gibney on Mon Aug 25, 2013  9:59 AM ------      Message from: Caren Griffins      Created: Mon Aug 25, 2013  9:03 AM                   ----- Message -----         From: Si Gaul, MD         Sent: 08/20/2013   1:23 PM           To: Conni Slipper, PA-C, #            Call patient with the result and give neutropenic precautions. ------

## 2013-08-25 NOTE — Telephone Encounter (Signed)
Spoke with patient regarding abnormal lab results.  Informed patient to follow neutropenic precautions (wash hands often, stay away from crowds and people who are sick, no fresh flower, and peel and wash fruits and vegetables), and to call if has a temperature of 100.5 or greater.  Per Tiana Loft, PA.  Patient verbalized understanding.

## 2013-08-26 ENCOUNTER — Other Ambulatory Visit: Payer: Self-pay | Admitting: Radiology

## 2013-08-26 ENCOUNTER — Ambulatory Visit
Admission: RE | Admit: 2013-08-26 | Discharge: 2013-08-26 | Disposition: A | Discharge status: Home / Self Care | Payer: Medicare Other | Source: Ambulatory Visit | Attending: Radiation Oncology | Admitting: Radiation Oncology

## 2013-08-26 ENCOUNTER — Encounter: Payer: Self-pay | Admitting: Radiation Oncology

## 2013-08-26 ENCOUNTER — Other Ambulatory Visit (HOSPITAL_COMMUNITY): Payer: Self-pay | Admitting: Radiology

## 2013-08-26 VITALS — BP 148/76 | HR 74 | Temp 98.4°F | Resp 20 | Wt 185.7 lb

## 2013-08-26 DIAGNOSIS — J040 Acute laryngitis: Secondary | ICD-10-CM | POA: Diagnosis not present

## 2013-08-26 DIAGNOSIS — C342 Malignant neoplasm of middle lobe, bronchus or lung: Secondary | ICD-10-CM | POA: Diagnosis not present

## 2013-08-26 DIAGNOSIS — C3490 Malignant neoplasm of unspecified part of unspecified bronchus or lung: Secondary | ICD-10-CM | POA: Diagnosis not present

## 2013-08-26 DIAGNOSIS — C3491 Malignant neoplasm of unspecified part of right bronchus or lung: Secondary | ICD-10-CM

## 2013-08-26 DIAGNOSIS — R05 Cough: Secondary | ICD-10-CM | POA: Diagnosis not present

## 2013-08-26 DIAGNOSIS — L589 Radiodermatitis, unspecified: Secondary | ICD-10-CM | POA: Diagnosis not present

## 2013-08-26 DIAGNOSIS — Z510 Encounter for antineoplastic radiation therapy: Secondary | ICD-10-CM | POA: Diagnosis not present

## 2013-08-26 MED ORDER — SUCRALFATE 1 G PO TABS: 1.0000 g | ORAL_TABLET | Freq: Three times a day (TID) | ORAL | Status: DC

## 2013-08-26 MED ORDER — HYDROCODONE-ACETAMINOPHEN 7.5-325 MG/15ML PO SOLN: 10.0000 mL | Freq: Four times a day (QID) | ORAL | Status: DC | PRN

## 2013-08-26 NOTE — Progress Notes (Signed)
[facility]    Radiation Oncology [address] [place]     Maryln Gottron, M.D. [place], [place] [address]               Billie Lade, M.D., Ph.D. Phone: [phone]      Molli Hazard A. Kathrynn Running, M.D. Fax: [phone]      Radene Gunning, M.D., Ph.D.         Lurline Hare, M.D.         Grayland Jack, M.D Weekly Treatment Management Note  Name: Elizabeth Humphrey     MRN: [id number]        CSN: [id number] Date: 08/26/2013      DOB: 1934/02/08  CC: [name], MD         [name]    Status: Outpatient  Diagnosis: The encounter diagnosis was Small cell lung carcinoma, right.  Current Dose: 1.8 Gy  Current Fraction: 1  Planned Dose: 59.4 Gy  Narrative: Elizabeth Humphrey was seen today for weekly treatment management. The chart was checked and MVCT  were reviewed. She is tolerating the treatments well thus far. She has developed laryngitis, etiology unknown. She denies any chills or fever or green productive sputum. She also has a dry cough. She denies any breathing problems.  Neosporin Current Outpatient Prescriptions  Medication Sig Dispense Refill  . aspirin 81 MG tablet Take 81 mg by mouth daily.      [name] HYDROcodone-acetaminophen (HYCET) 7.5-325 mg/15 ml solution Take 10 mLs by mouth 4 (four) times daily as needed for moderate pain.  120 mL  0  . HYDROcodone-homatropine (HYCODAN) 5-1.5 MG/5ML syrup Take 5 mLs by mouth every 6 (six) hours as needed for cough.      [name] LORazepam (ATIVAN) 0.5 MG tablet Take 1 tablet (0.5 mg total) by mouth as directed. 0.5mg  tablet, take prior to MRI.  1 tablet  0  . mupirocin ointment (BACTROBAN) 2 % Apply 1 application topically as needed. For cuts and scrapes      . naproxen sodium (ANAPROX) 220 MG tablet Take 440 mg by mouth daily.       . prochlorperazine (COMPAZINE) 10 MG tablet Take 1 tablet (10 mg total) by mouth every 6 (six) hours as needed for nausea or vomiting.  60 tablet  0  . simvastatin (ZOCOR) 40 MG tablet Take 40  mg by mouth at bedtime.      . sucralfate (CARAFATE) 1 G tablet Take 1 tablet (1 g total) by mouth 4 (four) times daily -  with meals and at bedtime.  120 tablet  1  . traMADol (ULTRAM) 50 MG tablet Take 1 tablet by mouth daily.        No current facility-administered medications for this encounter.   Labs:  Lab Results  Component Value Date   WBC 1.9* 08/20/2013   HGB 13.3 08/20/2013   HCT 39.4 08/20/2013   MCV 92.3 08/20/2013   PLT 100* 08/20/2013   Lab Results  Component Value Date   CREATININE 0.9 08/20/2013   BUN 28.0* 08/20/2013   NA 140 08/20/2013   K 3.9 08/20/2013   CL 105 07/31/2013   CO2 25 08/20/2013   Lab Results  Component Value Date   ALT 24 08/20/2013   AST 18 08/20/2013   BILITOT 0.69 08/20/2013    Physical Examination:  There were no vitals filed for this visit.  Wt Readings from Last 3 Encounters:  08/26/13 185 lb 11.2 oz (84.233 kg)  08/14/13 189 lb 8 oz (85.957 kg)  08/06/13 186 lb (84.369 kg)     Lungs - Normal respiratory effort, chest expands symmetrically. Lungs are clear to auscultation, no crackles or wheezes.  Heart has regular rhythm and rate  Abdomen is soft and non tender with normal bowel sounds  Assessment:  Patient tolerating treatments well  Plan: Continue treatment per original radiation prescription. She has been given a prescription for Hycet with anticipated esophagitis symptoms. She is also given a prescription for Carafate.

## 2013-08-26 NOTE — Progress Notes (Signed)
Pt denies pain, fatigue, loss of appetite. She has dry cough, is asking if she may take Hydromet for cough. Advised she discuss w/Dr Roselind Messier. Pt denies SOB except w/exertion.

## 2013-08-27 ENCOUNTER — Other Ambulatory Visit (HOSPITAL_BASED_OUTPATIENT_CLINIC_OR_DEPARTMENT_OTHER): Payer: Medicare Other

## 2013-08-27 ENCOUNTER — Telehealth: Payer: Self-pay | Admitting: Internal Medicine

## 2013-08-27 ENCOUNTER — Ambulatory Visit
Admission: RE | Admit: 2013-08-27 | Discharge: 2013-08-27 | Disposition: A | Discharge status: Home / Self Care | Payer: Medicare Other | Source: Ambulatory Visit | Attending: Radiation Oncology | Admitting: Radiation Oncology

## 2013-08-27 ENCOUNTER — Ambulatory Visit (HOSPITAL_BASED_OUTPATIENT_CLINIC_OR_DEPARTMENT_OTHER): Payer: Medicare Other | Admitting: Physician Assistant

## 2013-08-27 ENCOUNTER — Encounter: Payer: Self-pay | Admitting: Physician Assistant

## 2013-08-27 VITALS — BP 132/59 | HR 81 | Temp 98.0°F | Resp 18 | Ht 66.25 in | Wt 183.9 lb

## 2013-08-27 DIAGNOSIS — C3490 Malignant neoplasm of unspecified part of unspecified bronchus or lung: Secondary | ICD-10-CM

## 2013-08-27 DIAGNOSIS — Z510 Encounter for antineoplastic radiation therapy: Secondary | ICD-10-CM | POA: Diagnosis not present

## 2013-08-27 DIAGNOSIS — C342 Malignant neoplasm of middle lobe, bronchus or lung: Secondary | ICD-10-CM | POA: Diagnosis not present

## 2013-08-27 DIAGNOSIS — L589 Radiodermatitis, unspecified: Secondary | ICD-10-CM | POA: Diagnosis not present

## 2013-08-27 DIAGNOSIS — J040 Acute laryngitis: Secondary | ICD-10-CM | POA: Diagnosis not present

## 2013-08-27 DIAGNOSIS — C3491 Malignant neoplasm of unspecified part of right bronchus or lung: Secondary | ICD-10-CM

## 2013-08-27 DIAGNOSIS — R05 Cough: Secondary | ICD-10-CM | POA: Diagnosis not present

## 2013-08-27 LAB — COMPREHENSIVE METABOLIC PANEL (CC13)
AST: 22 U/L (ref 5–34)
Albumin: 3.7 g/dL (ref 3.5–5.0)
Alkaline Phosphatase: 69 U/L (ref 40–150)
Anion Gap: 10 mEq/L (ref 3–11)
BUN: 12.3 mg/dL (ref 7.0–26.0)
Chloride: 107 mEq/L (ref 98–109)
Glucose: 113 mg/dl (ref 70–140)
Sodium: 144 mEq/L (ref 136–145)
Total Bilirubin: 0.29 mg/dL (ref 0.20–1.20)
Total Protein: 7.1 g/dL (ref 6.4–8.3)

## 2013-08-27 LAB — CBC WITH DIFFERENTIAL/PLATELET
BASO%: 0.7 % (ref 0.0–2.0)
Basophils Absolute: 0.1 10*3/uL (ref 0.0–0.1)
EOS%: 0.5 % (ref 0.0–7.0)
HCT: 34.5 % — ABNORMAL LOW (ref 34.8–46.6)
HGB: 11.5 g/dL — ABNORMAL LOW (ref 11.6–15.9)
MCH: 31.3 pg (ref 25.1–34.0)
MCHC: 33.3 g/dL (ref 31.5–36.0)
MCV: 93.8 fL (ref 79.5–101.0)
MONO%: 14.6 % — ABNORMAL HIGH (ref 0.0–14.0)
NEUT%: 57.1 % (ref 38.4–76.8)
RBC: 3.68 10*6/uL — ABNORMAL LOW (ref 3.70–5.45)
RDW: 13.9 % (ref 11.2–14.5)
lymph#: 2 10*3/uL (ref 0.9–3.3)

## 2013-08-27 MED ORDER — LIDOCAINE-PRILOCAINE 2.5-2.5 % EX CREA: TOPICAL_CREAM | CUTANEOUS | Status: DC

## 2013-08-27 MED ORDER — EMOLLIENT BASE EX CREA: TOPICAL_CREAM | CUTANEOUS | Status: DC | PRN

## 2013-08-27 MED ORDER — BIAFINE EX EMUL
CUTANEOUS | Status: DC | PRN
  Administered 2013-08-27: 15:00:00 via TOPICAL

## 2013-08-27 NOTE — Progress Notes (Signed)
Was going to do patient education at 230 when patient came after rad tx, saw she had missed her lab due at 145pm and A.Johnson at 215pm, gave booklet,biafine cream , klaren hess RN business card, folded pages for patient to read will discuss tomorrow after rad txs,pain, skin irritation, fatigue,throat changes, diet, pain, then sent them upstairs to get lab work and called Tiana Loft letting her know patient sent upstairs, that her treatment wasn't completed till 230pm   .n

## 2013-08-27 NOTE — Telephone Encounter (Signed)
gv pt appt schedule for december and january. °

## 2013-08-28 ENCOUNTER — Ambulatory Visit (HOSPITAL_COMMUNITY)
Admission: RE | Admit: 2013-08-28 | Discharge: 2013-08-28 | Disposition: A | Discharge status: Home / Self Care | Payer: Medicare Other | Source: Ambulatory Visit | Attending: Internal Medicine | Admitting: Internal Medicine

## 2013-08-28 ENCOUNTER — Encounter (HOSPITAL_COMMUNITY): Payer: Self-pay

## 2013-08-28 ENCOUNTER — Encounter: Payer: Self-pay | Admitting: Physician Assistant

## 2013-08-28 ENCOUNTER — Other Ambulatory Visit: Payer: Self-pay | Admitting: Internal Medicine

## 2013-08-28 ENCOUNTER — Ambulatory Visit
Admission: RE | Admit: 2013-08-28 | Discharge: 2013-08-28 | Disposition: A | Discharge status: Home / Self Care | Payer: Medicare Other | Source: Ambulatory Visit | Attending: Radiation Oncology | Admitting: Radiation Oncology

## 2013-08-28 DIAGNOSIS — Z87891 Personal history of nicotine dependence: Secondary | ICD-10-CM | POA: Diagnosis not present

## 2013-08-28 DIAGNOSIS — L589 Radiodermatitis, unspecified: Secondary | ICD-10-CM | POA: Diagnosis not present

## 2013-08-28 DIAGNOSIS — Z7982 Long term (current) use of aspirin: Secondary | ICD-10-CM | POA: Diagnosis not present

## 2013-08-28 DIAGNOSIS — Z79899 Other long term (current) drug therapy: Secondary | ICD-10-CM | POA: Insufficient documentation

## 2013-08-28 DIAGNOSIS — C3490 Malignant neoplasm of unspecified part of unspecified bronchus or lung: Secondary | ICD-10-CM

## 2013-08-28 DIAGNOSIS — J449 Chronic obstructive pulmonary disease, unspecified: Secondary | ICD-10-CM | POA: Insufficient documentation

## 2013-08-28 DIAGNOSIS — Z510 Encounter for antineoplastic radiation therapy: Secondary | ICD-10-CM | POA: Diagnosis not present

## 2013-08-28 DIAGNOSIS — R05 Cough: Secondary | ICD-10-CM | POA: Diagnosis not present

## 2013-08-28 DIAGNOSIS — E785 Hyperlipidemia, unspecified: Secondary | ICD-10-CM | POA: Diagnosis not present

## 2013-08-28 DIAGNOSIS — C342 Malignant neoplasm of middle lobe, bronchus or lung: Secondary | ICD-10-CM | POA: Diagnosis not present

## 2013-08-28 DIAGNOSIS — J4489 Other specified chronic obstructive pulmonary disease: Secondary | ICD-10-CM | POA: Insufficient documentation

## 2013-08-28 DIAGNOSIS — Z8701 Personal history of pneumonia (recurrent): Secondary | ICD-10-CM | POA: Diagnosis not present

## 2013-08-28 DIAGNOSIS — J040 Acute laryngitis: Secondary | ICD-10-CM | POA: Diagnosis not present

## 2013-08-28 LAB — CBC
Hemoglobin: 11.9 g/dL — ABNORMAL LOW (ref 12.0–15.0)
MCHC: 34 g/dL (ref 30.0–36.0)
Platelets: 81 10*3/uL — ABNORMAL LOW (ref 150–400)
RDW: 13.9 % (ref 11.5–15.5)
WBC: 6.4 10*3/uL (ref 4.0–10.5)

## 2013-08-28 LAB — APTT: aPTT: 36 seconds (ref 24–37)

## 2013-08-28 LAB — PROTIME-INR
INR: 0.94 (ref 0.00–1.49)
Prothrombin Time: 12.4 seconds (ref 11.6–15.2)

## 2013-08-28 MED ORDER — MIDAZOLAM HCL 2 MG/2ML IJ SOLN
INTRAMUSCULAR | Status: AC
  Filled 2013-08-28: qty 6

## 2013-08-28 MED ORDER — HEPARIN SOD (PORK) LOCK FLUSH 100 UNIT/ML IV SOLN
INTRAVENOUS | Status: AC
  Filled 2013-08-28: qty 5

## 2013-08-28 MED ORDER — MIDAZOLAM HCL 2 MG/2ML IJ SOLN
INTRAMUSCULAR | Status: AC | PRN
  Administered 2013-08-28: 1 mg via INTRAVENOUS
  Administered 2013-08-28 (×2): 0.5 mg via INTRAVENOUS

## 2013-08-28 MED ORDER — LIDOCAINE-EPINEPHRINE (PF) 2 %-1:200000 IJ SOLN
INTRAMUSCULAR | Status: AC
  Filled 2013-08-28: qty 20

## 2013-08-28 MED ORDER — CEFAZOLIN SODIUM-DEXTROSE 2-3 GM-% IV SOLR
2.0000 g | INTRAVENOUS | Status: AC
  Administered 2013-08-28: 2 g via INTRAVENOUS
  Filled 2013-08-28: qty 50

## 2013-08-28 MED ORDER — FENTANYL CITRATE 0.05 MG/ML IJ SOLN
INTRAMUSCULAR | Status: AC
  Filled 2013-08-28: qty 6

## 2013-08-28 MED ORDER — SODIUM CHLORIDE 0.9 % IV SOLN
Freq: Once | INTRAVENOUS | Status: AC
  Administered 2013-08-28: 08:00:00 via INTRAVENOUS

## 2013-08-28 MED ORDER — FENTANYL CITRATE 0.05 MG/ML IJ SOLN
INTRAMUSCULAR | Status: AC | PRN
  Administered 2013-08-28 (×2): 50 ug via INTRAVENOUS

## 2013-08-28 NOTE — Progress Notes (Signed)
Elizabeth Humphrey here to complete post sim education.  Reviewed the potential side effects of treatment including fatigue, skin changes and throat changes.  Also reviewed how to apply biafine and to use it twice a day, after treatment and at bedtime.  Discussed PUT days with Dr. Roselind Messier on Tuesday's.  Advised her to call with any questions or concerns.

## 2013-08-28 NOTE — Procedures (Signed)
Successful placement of right IJ approach port-a-cath with tip at the superior caval atrial junction. The catheter is ready for immediate use. No immediate post procedural complications. 

## 2013-08-28 NOTE — Progress Notes (Addendum)
No images are attached to the encounter. No scans are attached to the encounter. No scans are attached to the encounter. [facility] SHARED VISIT PROGRESS NOTE  [name], MD [address] [facility], [place]. [place] [address]  DIAGNOSIS: Small cell lung carcinoma   Primary site: Lung (Right)   Staging method: AJCC 7th Edition   Clinical: Stage IIIA (T2a, N2, M0) signed by Si Gaul, MD on 08/06/2013  3:32 PM   Summary: Stage IIIA (T2a, N2, M0)  PRIOR THERAPY: None  CURRENT THERAPY: Systemic chemotherapy with carboplatin for an AUC of 5 given on day 1 and etoposide 120 mg per meter squared on days 1, 2 and 3 with Neulasta support given on day 4. This given concurrent with radiation.  DISEASE STAGE: Small cell lung carcinoma   Primary site: Lung (Right)   Staging method: AJCC 7th Edition   Clinical: Stage IIIA (T2a, N2, M0) signed by Si Gaul, MD on 08/06/2013  3:32 PM   Summary: Stage IIIA (T2a, N2, M0)  CHEMOTHERAPY INTENT: Palliative  CURRENT # OF CHEMOTHERAPY CYCLES: 1  CURRENT ANTIEMETICS: Zofran, dexamethasone and Compazine  CURRENT SMOKING STATUS: Former smoker, quit 09/12/1991  ORAL CHEMOTHERAPY AND CONSENT: N/A  CURRENT BISPHOSPHONATES USE: None  PAIN MANAGEMENT: Hydrocodone and acetaminophen, tramadol  NARCOTICS INDUCED CONSTIPATION: Mild  LIVING WILL AND CODE STATUS: ?   INTERVAL HISTORY: Elizabeth Humphrey 79 y.o. female returns for a scheduled regular office visit for followup of recently diagnosed limited stage small cell lung cancer. She status post 1 cycle of systemic chemotherapy with carboplatin and etoposide with Neulasta support. She is also currently undergoing palliative radiotherapy under the care Dr. Roselind Messier. Overall she tolerated her first cycle of chemotherapy relatively well with the exception of some mild episodes of constipation and diarrhea which has resolved. She has some difficulty sleeping  and is using an over-the-counter product, Zquil, with good results. She voiced no other specific complaints. She states that Dr. Roselind Messier gave her prescriptions for Hycodan and Carafate.  MEDICAL HISTORY: Past Medical History  Diagnosis Date  . Allergy     bee stings/anaphylaxis  . Hyperlipidemia     hx of  . Lung cancer   . COPD (chronic obstructive pulmonary disease) 07/30/13  . Heart murmur   . History of pneumonia   . Arthritis     ALLERGIES:  is allergic to neosporin.  MEDICATIONS:  Current Outpatient Prescriptions  Medication Sig Dispense Refill  . aspirin 81 MG tablet Take 81 mg by mouth daily.      Melene Muller ON 09/01/2013] emollient (BIAFINE) cream Apply 1 application topically 2 (two) times daily. Apply to affected skin area once skin becomes irriatated or red,itching, after rad txs then daily and bedtime      . HYDROcodone-acetaminophen (HYCET) 7.5-325 mg/15 ml solution Take 10 mLs by mouth 4 (four) times daily as needed for moderate pain.  120 mL  0  . lidocaine-prilocaine (EMLA) cream Apply to Port-A-Cath one to 2 hours prior to chemotherapy as directed  30 g  1  . mupirocin ointment (BACTROBAN) 2 % Apply 1 application topically as needed. For cuts and scrapes      . naproxen sodium (ANAPROX) 220 MG tablet Take 440 mg by mouth daily.       . prochlorperazine (COMPAZINE) 10 MG tablet Take 1 tablet (10 mg total) by mouth every 6 (six) hours as needed for nausea or vomiting.  60 tablet  0  . simvastatin (ZOCOR)  40 MG tablet Take 40 mg by mouth at bedtime.      . sucralfate (CARAFATE) 1 G tablet Take 1 tablet (1 g total) by mouth 4 (four) times daily -  with meals and at bedtime.  120 tablet  1  . traMADol (ULTRAM) 50 MG tablet Take 1 tablet by mouth daily.        No current facility-administered medications for this visit.   Facility-Administered Medications Ordered in Other Visits  Medication Dose Route Frequency Provider Last Rate Last Dose  . heparin lock flush 100  UNIT/ML injection           . lidocaine-EPINEPHrine (XYLOCAINE W/EPI) 2 %-1:200000 (PF) injection             SURGICAL HISTORY:  Past Surgical History  Procedure Laterality Date  . Dilation and curettage of uterus  yrs ago  . Tonsillectomy    . Cataract extraction      both eyes  . Video bronchoscopy with endobronchial ultrasound N/A 08/04/2013    Procedure: VIDEO BRONCHOSCOPY WITH ENDOBRONCHIAL ULTRASOUND;  Surgeon: Loreli Slot, MD;  Location: [facility] OR;  Service: Thoracic;  Laterality: N/A;    REVIEW OF SYSTEMS:  Constitutional: positive for Difficulty sleeping Eyes: negative Ears, nose, mouth, throat, and face: negative Respiratory: negative Cardiovascular: negative Gastrointestinal: positive for constipation and diarrhea Genitourinary:negative Integument/breast: negative Hematologic/lymphatic: negative Musculoskeletal:negative Neurological: negative Behavioral/Psych: negative Endocrine: negative Allergic/Immunologic: negative   PHYSICAL EXAMINATION: General appearance: alert, cooperative, appears stated age and no distress Head: Normocephalic, without obvious abnormality, atraumatic Neck: no adenopathy, no carotid bruit, no JVD, supple, symmetrical, trachea midline and thyroid not enlarged, symmetric, no tenderness/mass/nodules Lymph nodes: Cervical, supraclavicular, and axillary nodes normal. Resp: clear to auscultation bilaterally Back: symmetric, no curvature. ROM normal. No CVA tenderness. Cardio: regular rate and rhythm, S1, S2 normal, no murmur, click, rub or gallop GI: soft, non-tender; bowel sounds normal; no masses,  no organomegaly Extremities: extremities normal, atraumatic, no cyanosis or edema Neurologic: Alert and oriented X 3, normal strength and tone. Normal symmetric reflexes. Normal coordination and gait  ECOG PERFORMANCE STATUS: 1 - Symptomatic but completely ambulatory  Blood pressure 132/59, pulse 81, temperature 98 F (36.7 C), temperature  source Oral, resp. rate 18, height 5' 6.25" (1.683 m), weight 183 lb 14.4 oz (83.416 kg).  LABORATORY DATA: Lab Results  Component Value Date   WBC 6.4 08/28/2013   HGB 11.9* 08/28/2013   HCT 35.0* 08/28/2013   MCV 92.8 08/28/2013   PLT 81* 08/28/2013      Chemistry      Component Value Date/Time   NA 144 08/27/2013 1437   NA 142 07/31/2013 1300   K 3.7 08/27/2013 1437   K 4.1 07/31/2013 1300   CL 105 07/31/2013 1300   CO2 27 08/27/2013 1437   CO2 30 07/31/2013 1300   BUN 12.3 08/27/2013 1437   BUN 23 07/31/2013 1300   CREATININE 0.8 08/27/2013 1437   CREATININE 0.76 07/31/2013 1300      Component Value Date/Time   CALCIUM 9.7 08/27/2013 1437   CALCIUM 9.4 07/31/2013 1300   ALKPHOS 69 08/27/2013 1437   ALKPHOS 60 07/31/2013 1300   AST 22 08/27/2013 1437   AST 21 07/31/2013 1300   ALT 17 08/27/2013 1437   ALT 20 07/31/2013 1300   BILITOT 0.29 08/27/2013 1437   BILITOT 0.4 07/31/2013 1300       RADIOGRAPHIC STUDIES:  Dg Chest 2 View Within Previous 72 Hours.  Films Obtained On Friday Are  Acceptable For Monday And Tuesday Cases  08/04/2013   CLINICAL DATA:  Lung cancer.  EXAM: CHEST  2 VIEW  COMPARISON:  PET-CT 07/24/2013.  FINDINGS: Mediastinum is unremarkable. Right hilar bullous noted consistent with adenopathy. Right middle lobe mass again noted consistent with malignancy. This was PET positive on prior PET-CT. No focal infiltrate to suggest pneumonia. No pleural effusion or pneumothorax. Heart size and pulmonary vascularity normal. No acute osseous abnormality.  IMPRESSION: 1. Persistent mass lesion in the right middle lobe. Associated persistent right hilar adenopathy. These findings were PET positive on prior PET-CT of 07/24/2013. These findings consistent with patient's known diagnosis of lung cancer. 2. No acute abnormality .   Electronically Signed   By: Maisie Fus  Register   On: 08/04/2013 10:22   Mr Laqueta Jean Wo Contrast  08/13/2013   CLINICAL DATA:  Small cell  lung cancer.  EXAM: MRI HEAD WITHOUT AND WITH CONTRAST  TECHNIQUE: Multiplanar, multiecho pulse sequences of the brain and surrounding structures were obtained without and with intravenous contrast.  CONTRAST:  20mL MULTIHANCE GADOBENATE DIMEGLUMINE 529 MG/ML IV SOLN  COMPARISON:  None.  FINDINGS: Moderate generalized atrophy is present. Extensive periventricular and subcortical white matter disease is present bilaterally. No acute infarct, hemorrhage, or mass lesion is present. A relatively empty sella is noted.  The postcontrast images demonstrate no pathologic enhancement to suggest metastatic disease of the brain or meninges.  Flow is present in the major intracranial arteries. The patient is status post bilateral lens extractions. The ventricles are of normal size. No significant extra-axial fluid collection is present. Minimal mucosal thickening is present in the ethmoid air cells. The mastoid air cells are clear.  Note is made of hyperostosis frontalis internus without evidence for osseous metastases.  IMPRESSION: 1. No evidence for metastatic disease of the brain or meninges. 2. Moderate generalized atrophy and diffuse white matter disease. This likely reflects the sequela of chronic microvascular ischemia.   Electronically Signed   By: Gennette Pac M.D.   On: 08/13/2013 21:25   Ir Fluoro Guide Cv Line Right  08/28/2013   INDICATION: History of small cell lung cancer, in need of intravenous access for chemotherapy administration  EXAM: IMPLANTED PORT A CATH PLACEMENT WITH ULTRASOUND AND FLUOROSCOPIC GUIDANCE  MEDICATIONS: Ancef 2 gm IV; IV antibiotic was given in an appropriate time interval prior to skin puncture.  ANESTHESIA/SEDATION: Versed 2 mg IV; Fentanyl 100 mcg IV;  Total Moderate Sedation Time  30  minutes.  CONTRAST:  None  COMPARISON:  None.  FLUOROSCOPY TIME:  30 seconds  PROCEDURE: The procedure, risks, benefits, and alternatives were explained to the patient. Questions regarding the  procedure were encouraged and answered. The patient understands and consents to the procedure.  The right neck and chest were prepped with chlorhexidine in a sterile fashion, and a sterile drape was applied covering the operative field. Maximum barrier sterile technique with sterile gowns and gloves were used for the procedure. A timeout was performed prior to the initiation of the procedure. Local anesthesia was provided with 1% lidocaine with epinephrine.  After creating a small venotomy incision, a micropuncture kit was utilized to access the internal jugular vein under direct, real-time ultrasound guidance. Ultrasound image documentation was performed. The microwire was kinked to measure appropriate catheter length.  A subcutaneous port pocket was then created along the upper chest wall utilizing a combination of sharp and blunt dissection. The pocket was irrigated with sterile saline. A single lumen ISP power injectable port was chosen  for placement. The 8 Fr catheter was tunneled from the port pocket site to the venotomy incision. The port was placed in the pocket. The external catheter was trimmed to appropriate length. At the venotomy, an 8 Fr peel-away sheath was placed over a guidewire under fluoroscopic guidance. The catheter was then placed through the sheath and the sheath was removed. Final catheter positioning was confirmed and documented with a fluoroscopic spot radiograph. The port was accessed with a Huber needle, aspirated and flushed with heparinized saline.  The venotomy site was closed with an interrupted 4-0 Vicryl suture. The port pocket incision was closed with interrupted 2-0 Vicryl suture and the skin was opposed with a running subcuticular 4-0 Vicryl suture. Dermabond and Steri-strips were applied to both incisions. Dressings were placed. The patient tolerated the procedure well without immediate post procedural complication.  COMPLICATIONS: None immediate  FINDINGS: After catheter  placement, the tip lies within the superior cavoatrial junction. The catheter aspirates and flushes normally and is ready for immediate use.  IMPRESSION: Successful placement of a right internal jugular approach power injectable Port-A-Cath. The catheter is ready for immediate use.   Electronically Signed   By: Simonne Come M.D.   On: 08/28/2013 10:28   Ir US Guide Vasc Access Right  08/28/2013   INDICATION: History of small cell lung cancer, in need of intravenous access for chemotherapy administration  EXAM: IMPLANTED PORT A CATH PLACEMENT WITH ULTRASOUND AND FLUOROSCOPIC GUIDANCE  MEDICATIONS: Ancef 2 gm IV; IV antibiotic was given in an appropriate time interval prior to skin puncture.  ANESTHESIA/SEDATION: Versed 2 mg IV; Fentanyl 100 mcg IV;  Total Moderate Sedation Time  30  minutes.  CONTRAST:  None  COMPARISON:  None.  FLUOROSCOPY TIME:  30 seconds  PROCEDURE: The procedure, risks, benefits, and alternatives were explained to the patient. Questions regarding the procedure were encouraged and answered. The patient understands and consents to the procedure.  The right neck and chest were prepped with chlorhexidine in a sterile fashion, and a sterile drape was applied covering the operative field. Maximum barrier sterile technique with sterile gowns and gloves were used for the procedure. A timeout was performed prior to the initiation of the procedure. Local anesthesia was provided with 1% lidocaine with epinephrine.  After creating a small venotomy incision, a micropuncture kit was utilized to access the internal jugular vein under direct, real-time ultrasound guidance. Ultrasound image documentation was performed. The microwire was kinked to measure appropriate catheter length.  A subcutaneous port pocket was then created along the upper chest wall utilizing a combination of sharp and blunt dissection. The pocket was irrigated with sterile saline. A single lumen ISP power injectable port was chosen for  placement. The 8 Fr catheter was tunneled from the port pocket site to the venotomy incision. The port was placed in the pocket. The external catheter was trimmed to appropriate length. At the venotomy, an 8 Fr peel-away sheath was placed over a guidewire under fluoroscopic guidance. The catheter was then placed through the sheath and the sheath was removed. Final catheter positioning was confirmed and documented with a fluoroscopic spot radiograph. The port was accessed with a Huber needle, aspirated and flushed with heparinized saline.  The venotomy site was closed with an interrupted 4-0 Vicryl suture. The port pocket incision was closed with interrupted 2-0 Vicryl suture and the skin was opposed with a running subcuticular 4-0 Vicryl suture. Dermabond and Steri-strips were applied to both incisions. Dressings were placed. The patient tolerated the  procedure well without immediate post procedural complication.  COMPLICATIONS: None immediate  FINDINGS: After catheter placement, the tip lies within the superior cavoatrial junction. The catheter aspirates and flushes normally and is ready for immediate use.  IMPRESSION: Successful placement of a right internal jugular approach power injectable Port-A-Cath. The catheter is ready for immediate use.   Electronically Signed   By: Simonne Come M.D.   On: 08/28/2013 10:28     ASSESSMENT/PLAN: Patient is a very pleasant 79 year old Caucasian female recently diagnosed with limited stage small cell lung cancer. She is status post one cycle of systemic chemotherapy with carboplatin and etoposide with Neulasta support. She is also undergoing radiation therapy under the care Dr. Roselind Messier. Thus far she is tolerating both relatively well. Patient was discussed with an also seen by Dr. Arbutus Ped. She'll proceed with weekly labs as scheduled and return in 2 weeks for another symptom management visit.     Laural Benes, Eddie Payette E, PA-C     All questions were answered. The patient  knows to call the clinic with any problems, questions or concerns. We can certainly see the patient much sooner if necessary.  ADDENDUM: Hematology/Oncology Attending: I had the face to face encounter with the patient. I recommended for her care plan. This is a very pleasant 79 years old white female recently diagnosed with limited stage small cell lung cancer currently undergoing systemic chemotherapy with carboplatin and etoposide started last week. She is tolerating her treatment fairly well with no significant adverse effects. She is also undergoing concurrent radiotherapy under the care of Dr. Roselind Messier. The patient would come back for follow up visit in 2 weeks with the start of cycle #2. She was advised to call immediately if she has any concerning symptoms in the interval. Lajuana Matte., MD 09/01/2013

## 2013-08-28 NOTE — H&P (Signed)
Elizabeth Humphrey is an 79 y.o. female.   Chief Complaint: "I'm here for a port a cath" HPI: Patient with history of recently diagnosed small cell lung carcinoma presents today for port a cath placement for chemotherapy.  Past Medical History  Diagnosis Date  . Allergy     bee stings/anaphylaxis  . Hyperlipidemia     hx of  . Lung cancer   . COPD (chronic obstructive pulmonary disease) 07/30/13  . Heart murmur   . History of pneumonia   . Arthritis     Past Surgical History  Procedure Laterality Date  . Dilation and curettage of uterus  yrs ago  . Tonsillectomy    . Cataract extraction      both eyes  . Video bronchoscopy with endobronchial ultrasound N/A 08/04/2013    Procedure: VIDEO BRONCHOSCOPY WITH ENDOBRONCHIAL ULTRASOUND;  Surgeon: Loreli Slot, MD;  Location: [facility] OR;  Service: Thoracic;  Laterality: N/A;    Family History  Problem Relation Age of Onset  . Heart disease Mother   . Heart disease Father    Social History:  reports that she quit smoking about 21 years ago. Her smoking use included Cigarettes. She has a 25 pack-year smoking history. She has never used smokeless tobacco. She reports that she drinks about 3.5 ounces of alcohol per week. She reports that she does not use illicit drugs.  Allergies:  Allergies  Allergen Reactions  . Neosporin [Neomycin-Bacitracin Zn-Polymyx] Rash    Current outpatient prescriptions:aspirin 81 MG tablet, Take 81 mg by mouth daily., Disp: , Rfl: ;  mupirocin ointment (BACTROBAN) 2 %, Apply 1 application topically as needed. For cuts and scrapes, Disp: , Rfl: ;  naproxen sodium (ANAPROX) 220 MG tablet, Take 440 mg by mouth daily. , Disp: , Rfl:  prochlorperazine (COMPAZINE) 10 MG tablet, Take 1 tablet (10 mg total) by mouth every 6 (six) hours as needed for nausea or vomiting., Disp: 60 tablet, Rfl: 0;  simvastatin (ZOCOR) 40 MG tablet, Take 40 mg by mouth at bedtime., Disp: , Rfl: ;  sucralfate (CARAFATE) 1 G tablet, Take  1 tablet (1 g total) by mouth 4 (four) times daily -  with meals and at bedtime., Disp: 120 tablet, Rfl: 1 traMADol (ULTRAM) 50 MG tablet, Take 1 tablet by mouth daily. , Disp: , Rfl: ;  [START ON 09/01/2013] emollient (BIAFINE) cream, Apply 1 application topically 2 (two) times daily. Apply to affected skin area once skin becomes irriatated or red,itching, after rad txs then daily and bedtime, Disp: , Rfl:  HYDROcodone-acetaminophen (HYCET) 7.5-325 mg/15 ml solution, Take 10 mLs by mouth 4 (four) times daily as needed for moderate pain., Disp: 120 mL, Rfl: 0;  lidocaine-prilocaine (EMLA) cream, Apply to Port-A-Cath one to 2 hours prior to chemotherapy as directed, Disp: 30 g, Rfl: 1   Results for orders placed during the hospital encounter of 08/28/13 (from the past 48 hour(s))  CBC     Status: Abnormal   Collection Time    08/28/13  7:55 AM      Result Value Range   WBC 6.4  4.0 - 10.5 K/uL   RBC 3.77 (*) 3.87 - 5.11 MIL/uL   Hemoglobin 11.9 (*) 12.0 - 15.0 g/dL   HCT 16.1 (*) 09.6 - 04.5 %   MCV 92.8  78.0 - 100.0 fL   MCH 31.6  26.0 - 34.0 pg   MCHC 34.0  30.0 - 36.0 g/dL   RDW 40.9  81.1 - 91.4 %  Platelets 81 (*) 150 - 400 K/uL   Comment: SPECIMEN CHECKED FOR CLOTS     PLATELET COUNT CONFIRMED BY SMEAR   No results found.  Review of Systems  Constitutional: Negative for fever and chills.  Respiratory: Negative for hemoptysis and shortness of breath.        Occ cough  Cardiovascular: Negative for chest pain.  Gastrointestinal: Negative for nausea, vomiting and abdominal pain.  Musculoskeletal:       Occ back pain  Neurological: Negative for headaches.  Endo/Heme/Allergies: Does not bruise/bleed easily.    Blood pressure 120/72, pulse 79, temperature 98 F (36.7 C), temperature source Oral, resp. rate 18, SpO2 99.00%. Physical Exam  Constitutional: She is oriented to person, place, and time. She appears well-developed and well-nourished.  Cardiovascular: Normal rate and  regular rhythm.   Murmur heard. Respiratory: Effort normal and breath sounds normal.  GI: Soft. Bowel sounds are normal. There is no tenderness.  Musculoskeletal: Normal range of motion. She exhibits no edema.  Neurological: She is alert and oriented to person, place, and time.     Assessment/Plan Patient with history of recently diagnosed small cell lung carcinoma presents today for port a cath placement for chemotherapy. Details/risks of procedure d/w pt/husband with their understanding and consent.   ALLRED,D KEVIN 08/28/2013, 8:32 AM

## 2013-08-29 ENCOUNTER — Ambulatory Visit
Admission: RE | Admit: 2013-08-29 | Discharge: 2013-08-29 | Disposition: A | Discharge status: Home / Self Care | Payer: Medicare Other | Source: Ambulatory Visit | Attending: Radiation Oncology | Admitting: Radiation Oncology

## 2013-08-29 DIAGNOSIS — Z510 Encounter for antineoplastic radiation therapy: Secondary | ICD-10-CM | POA: Diagnosis not present

## 2013-08-29 DIAGNOSIS — R05 Cough: Secondary | ICD-10-CM | POA: Diagnosis not present

## 2013-08-29 DIAGNOSIS — L589 Radiodermatitis, unspecified: Secondary | ICD-10-CM | POA: Diagnosis not present

## 2013-08-29 DIAGNOSIS — C342 Malignant neoplasm of middle lobe, bronchus or lung: Secondary | ICD-10-CM | POA: Diagnosis not present

## 2013-08-29 DIAGNOSIS — J040 Acute laryngitis: Secondary | ICD-10-CM | POA: Diagnosis not present

## 2013-08-29 DIAGNOSIS — C3490 Malignant neoplasm of unspecified part of unspecified bronchus or lung: Secondary | ICD-10-CM | POA: Diagnosis not present

## 2013-08-31 ENCOUNTER — Encounter: Payer: Self-pay | Admitting: Physician Assistant

## 2013-08-31 NOTE — Patient Instructions (Signed)
Continue with labs and chemotherapy as scheduled Continue her course of radiation as scheduled Followup in 2 weeks

## 2013-09-01 ENCOUNTER — Ambulatory Visit
Admission: RE | Admit: 2013-09-01 | Discharge: 2013-09-01 | Disposition: A | Discharge status: Home / Self Care | Payer: Medicare Other | Source: Ambulatory Visit | Attending: Radiation Oncology | Admitting: Radiation Oncology

## 2013-09-01 DIAGNOSIS — R05 Cough: Secondary | ICD-10-CM | POA: Diagnosis not present

## 2013-09-01 DIAGNOSIS — C342 Malignant neoplasm of middle lobe, bronchus or lung: Secondary | ICD-10-CM | POA: Diagnosis not present

## 2013-09-01 DIAGNOSIS — L589 Radiodermatitis, unspecified: Secondary | ICD-10-CM | POA: Diagnosis not present

## 2013-09-01 DIAGNOSIS — Z510 Encounter for antineoplastic radiation therapy: Secondary | ICD-10-CM | POA: Diagnosis not present

## 2013-09-01 DIAGNOSIS — C3490 Malignant neoplasm of unspecified part of unspecified bronchus or lung: Secondary | ICD-10-CM | POA: Diagnosis not present

## 2013-09-01 DIAGNOSIS — J040 Acute laryngitis: Secondary | ICD-10-CM | POA: Diagnosis not present

## 2013-09-02 ENCOUNTER — Other Ambulatory Visit: Payer: Self-pay | Admitting: Internal Medicine

## 2013-09-02 ENCOUNTER — Ambulatory Visit (HOSPITAL_BASED_OUTPATIENT_CLINIC_OR_DEPARTMENT_OTHER): Payer: Medicare Other

## 2013-09-02 ENCOUNTER — Ambulatory Visit
Admission: RE | Admit: 2013-09-02 | Discharge: 2013-09-02 | Disposition: A | Discharge status: Home / Self Care | Payer: Medicare Other | Source: Ambulatory Visit | Attending: Radiation Oncology | Admitting: Radiation Oncology

## 2013-09-02 ENCOUNTER — Other Ambulatory Visit (HOSPITAL_BASED_OUTPATIENT_CLINIC_OR_DEPARTMENT_OTHER): Payer: Medicare Other

## 2013-09-02 VITALS — BP 106/73 | HR 89 | Temp 98.2°F | Resp 18

## 2013-09-02 VITALS — BP 129/102 | HR 87 | Temp 98.3°F | Wt 186.1 lb

## 2013-09-02 DIAGNOSIS — Z5111 Encounter for antineoplastic chemotherapy: Secondary | ICD-10-CM | POA: Diagnosis not present

## 2013-09-02 DIAGNOSIS — C3490 Malignant neoplasm of unspecified part of unspecified bronchus or lung: Secondary | ICD-10-CM

## 2013-09-02 DIAGNOSIS — C3491 Malignant neoplasm of unspecified part of right bronchus or lung: Secondary | ICD-10-CM

## 2013-09-02 DIAGNOSIS — J040 Acute laryngitis: Secondary | ICD-10-CM | POA: Diagnosis not present

## 2013-09-02 DIAGNOSIS — L589 Radiodermatitis, unspecified: Secondary | ICD-10-CM | POA: Diagnosis not present

## 2013-09-02 DIAGNOSIS — R05 Cough: Secondary | ICD-10-CM | POA: Diagnosis not present

## 2013-09-02 DIAGNOSIS — C342 Malignant neoplasm of middle lobe, bronchus or lung: Secondary | ICD-10-CM | POA: Diagnosis not present

## 2013-09-02 DIAGNOSIS — Z510 Encounter for antineoplastic radiation therapy: Secondary | ICD-10-CM | POA: Diagnosis not present

## 2013-09-02 LAB — CBC WITH DIFFERENTIAL/PLATELET
BASO%: 0.5 % (ref 0.0–2.0)
EOS%: 0.5 % (ref 0.0–7.0)
HCT: 33.5 % — ABNORMAL LOW (ref 34.8–46.6)
HGB: 11.7 g/dL (ref 11.6–15.9)
LYMPH%: 22.9 % (ref 14.0–49.7)
MCH: 32.7 pg (ref 25.1–34.0)
MCHC: 34.9 g/dL (ref 31.5–36.0)
MONO#: 0.7 10*3/uL (ref 0.1–0.9)
NEUT%: 62.9 % (ref 38.4–76.8)
RBC: 3.58 10*6/uL — ABNORMAL LOW (ref 3.70–5.45)
RDW: 14.1 % (ref 11.2–14.5)
WBC: 5.3 10*3/uL (ref 3.9–10.3)
lymph#: 1.2 10*3/uL (ref 0.9–3.3)

## 2013-09-02 LAB — COMPREHENSIVE METABOLIC PANEL (CC13)
ALT: 19 U/L (ref 0–55)
AST: 23 U/L (ref 5–34)
Alkaline Phosphatase: 60 U/L (ref 40–150)
BUN: 15.5 mg/dL (ref 7.0–26.0)
Calcium: 9.2 mg/dL (ref 8.4–10.4)
Chloride: 107 mEq/L (ref 98–109)
Creatinine: 0.8 mg/dL (ref 0.6–1.1)
Sodium: 142 mEq/L (ref 136–145)
Total Protein: 7.1 g/dL (ref 6.4–8.3)

## 2013-09-02 MED ORDER — DEXAMETHASONE SODIUM PHOSPHATE 20 MG/5ML IJ SOLN
20.0000 mg | Freq: Once | INTRAMUSCULAR | Status: AC
  Administered 2013-09-02: 20 mg via INTRAVENOUS

## 2013-09-02 MED ORDER — SODIUM CHLORIDE 0.9 % IV SOLN
439.0000 mg | Freq: Once | INTRAVENOUS | Status: AC
  Administered 2013-09-02: 440 mg via INTRAVENOUS
  Filled 2013-09-02: qty 44

## 2013-09-02 MED ORDER — SODIUM CHLORIDE 0.9 % IJ SOLN
10.0000 mL | INTRAMUSCULAR | Status: DC | PRN
  Administered 2013-09-02: 10 mL
  Filled 2013-09-02: qty 10

## 2013-09-02 MED ORDER — ONDANSETRON 16 MG/50ML IVPB (CHCC)
INTRAVENOUS | Status: AC
  Filled 2013-09-02: qty 16

## 2013-09-02 MED ORDER — SODIUM CHLORIDE 0.9 % IV SOLN
Freq: Once | INTRAVENOUS | Status: AC
  Administered 2013-09-02: 09:00:00 via INTRAVENOUS

## 2013-09-02 MED ORDER — SODIUM CHLORIDE 0.9 % IV SOLN
120.0000 mg/m2 | Freq: Once | INTRAVENOUS | Status: AC
  Administered 2013-09-02: 240 mg via INTRAVENOUS
  Filled 2013-09-02: qty 12

## 2013-09-02 MED ORDER — ONDANSETRON 16 MG/50ML IVPB (CHCC)
16.0000 mg | Freq: Once | INTRAVENOUS | Status: AC
  Administered 2013-09-02: 16 mg via INTRAVENOUS

## 2013-09-02 MED ORDER — HEPARIN SOD (PORK) LOCK FLUSH 100 UNIT/ML IV SOLN
500.0000 [IU] | Freq: Once | INTRAVENOUS | Status: AC | PRN
  Administered 2013-09-02: 500 [IU]
  Filled 2013-09-02: qty 5

## 2013-09-02 MED ORDER — DEXAMETHASONE SODIUM PHOSPHATE 20 MG/5ML IJ SOLN
INTRAMUSCULAR | Status: AC
  Filled 2013-09-02: qty 5

## 2013-09-02 NOTE — Patient Instructions (Signed)
[facility] Discharge Instructions for Patients Receiving Chemotherapy  Today you received the following chemotherapy agents Carboplatin/VP 16 (Etoposide) To help prevent nausea and vomiting after your treatment, we encourage you to take your nausea medication as prescribed.  If you develop nausea and vomiting that is not controlled by your nausea medication, call the clinic.   BELOW ARE SYMPTOMS THAT SHOULD BE REPORTED IMMEDIATELY:  *FEVER GREATER THAN 100.5 F  *CHILLS WITH OR WITHOUT FEVER  NAUSEA AND VOMITING THAT IS NOT CONTROLLED WITH YOUR NAUSEA MEDICATION  *UNUSUAL SHORTNESS OF BREATH  *UNUSUAL BRUISING OR BLEEDING  TENDERNESS IN MOUTH AND THROAT WITH OR WITHOUT PRESENCE OF ULCERS  *URINARY PROBLEMS  *BOWEL PROBLEMS  UNUSUAL RASH Items with * indicate a potential emergency and should be followed up as soon as possible.  Feel free to call the clinic you have any questions or concerns. The clinic phone number is [phone].    

## 2013-09-02 NOTE — Progress Notes (Signed)
Patient here for routine weekly assessment of radiation to right lung.Completed 6 of 33 of treatments.Denies pain or shortness of breath but does have occasional cough.Started started second cycle chemotherapy this week.No fatigue.

## 2013-09-02 NOTE — Progress Notes (Signed)
[facility]    Radiation Oncology [address] [place]     Maryln Gottron, M.D. [place], [place] [address]               Billie Lade, M.D., Ph.D. Phone: [phone]      Molli Hazard A. Kathrynn Running, M.D. Fax: [phone]      Radene Gunning, M.D., Ph.D.         Lurline Hare, M.D.         Grayland Jack, M.D Weekly Treatment Management Note  Name: Elizabeth Humphrey     MRN: [id number]        CSN: [id number] Date: 09/02/2013      DOB: 1933/10/20  CC: [name], MD         Jacky Kindle    Status: Outpatient  Diagnosis: The encounter diagnosis was Small cell lung carcinoma, unspecified laterality.  Current Dose: 10.8 Gy  Current Fraction: 6  Planned Dose: 59.4 Gy  Narrative: Flonnie D Hohensee was seen today for weekly treatment management. The chart was checked and MVCT  were reviewed. She is tolerating the treatments well at this time. She denies any swallowing problems. She has had a dry cough but over-the-counter cough medicine takes care of this issue. she denies any chills or fever.  Neosporin Current Outpatient Prescriptions  Medication Sig Dispense Refill  . aspirin 81 MG tablet Take 81 mg by mouth daily.      [name] emollient (BIAFINE) cream Apply 1 application topically 2 (two) times daily. Apply to affected skin area once skin becomes irriatated or red,itching, after rad txs then daily and bedtime      . lidocaine-prilocaine (EMLA) cream Apply to Port-A-Cath one to 2 hours prior to chemotherapy as directed  30 g  1  . mupirocin ointment (BACTROBAN) 2 % Apply 1 application topically as needed. For cuts and scrapes      . naproxen sodium (ANAPROX) 220 MG tablet Take 440 mg by mouth daily.       . prochlorperazine (COMPAZINE) 10 MG tablet Take 1 tablet (10 mg total) by mouth every 6 (six) hours as needed for nausea or vomiting.  60 tablet  0  . simvastatin (ZOCOR) 40 MG tablet Take 40 mg by mouth at bedtime.      . sucralfate (CARAFATE) 1 G tablet Take 1 tablet (1  g total) by mouth 4 (four) times daily -  with meals and at bedtime.  120 tablet  1  . traMADol (ULTRAM) 50 MG tablet Take 1 tablet by mouth daily.       [name] HYDROcodone-acetaminophen (HYCET) 7.5-325 mg/15 ml solution Take 10 mLs by mouth 4 (four) times daily as needed for moderate pain.  120 mL  0   No current facility-administered medications for this encounter.   Facility-Administered Medications Ordered in Other Encounters  Medication Dose Route Frequency Provider Last Rate Last Dose  . sodium chloride 0.9 % injection 10 mL  10 mL Intracatheter PRN Si Gaul, MD   10 mL at 09/02/13 1137   Labs:  Lab Results  Component Value Date   WBC 5.3 09/02/2013   HGB 11.7 09/02/2013   HCT 33.5* 09/02/2013   MCV 93.6 09/02/2013   PLT 234 09/02/2013   Lab Results  Component Value Date   CREATININE 0.8 09/02/2013   BUN 15.5 09/02/2013   NA 142 09/02/2013   K 4.2 09/02/2013   CL 105 07/31/2013   CO2 25 09/02/2013   Lab Results  Component Value Date   ALT 19 09/02/2013   AST 23 09/02/2013   BILITOT 0.36 09/02/2013    Physical Examination:  Filed Vitals:   09/02/13 1237  BP: 129/102  Pulse: 87  Temp: 98.3 F (36.8 C)    Wt Readings from Last 3 Encounters:  09/02/13 186 lb 1.6 oz (84.414 kg)  08/27/13 183 lb 14.4 oz (83.416 kg)  08/26/13 185 lb 11.2 oz (84.233 kg)     Lungs - Normal respiratory effort, chest expands symmetrically. Lungs are clear to auscultation, no crackles or wheezes.  Heart has regular rhythm and rate  Abdomen is soft and non tender with normal bowel sounds  Assessment:  Patient tolerating treatments well  Plan: Continue treatment per original radiation prescription

## 2013-09-03 ENCOUNTER — Other Ambulatory Visit: Payer: Medicare Other | Admitting: Lab

## 2013-09-03 ENCOUNTER — Ambulatory Visit (HOSPITAL_BASED_OUTPATIENT_CLINIC_OR_DEPARTMENT_OTHER): Payer: Medicare Other

## 2013-09-03 ENCOUNTER — Ambulatory Visit
Admission: RE | Admit: 2013-09-03 | Discharge: 2013-09-03 | Disposition: A | Discharge status: Home / Self Care | Payer: Medicare Other | Source: Ambulatory Visit | Attending: Radiation Oncology | Admitting: Radiation Oncology

## 2013-09-03 VITALS — BP 130/48 | HR 86 | Temp 97.6°F | Resp 17

## 2013-09-03 DIAGNOSIS — Z5111 Encounter for antineoplastic chemotherapy: Secondary | ICD-10-CM

## 2013-09-03 DIAGNOSIS — L589 Radiodermatitis, unspecified: Secondary | ICD-10-CM | POA: Diagnosis not present

## 2013-09-03 DIAGNOSIS — J040 Acute laryngitis: Secondary | ICD-10-CM | POA: Diagnosis not present

## 2013-09-03 DIAGNOSIS — C342 Malignant neoplasm of middle lobe, bronchus or lung: Secondary | ICD-10-CM

## 2013-09-03 DIAGNOSIS — C3491 Malignant neoplasm of unspecified part of right bronchus or lung: Secondary | ICD-10-CM

## 2013-09-03 DIAGNOSIS — Z510 Encounter for antineoplastic radiation therapy: Secondary | ICD-10-CM | POA: Diagnosis not present

## 2013-09-03 DIAGNOSIS — R05 Cough: Secondary | ICD-10-CM | POA: Diagnosis not present

## 2013-09-03 DIAGNOSIS — C3490 Malignant neoplasm of unspecified part of unspecified bronchus or lung: Secondary | ICD-10-CM | POA: Diagnosis not present

## 2013-09-03 MED ORDER — DEXAMETHASONE SODIUM PHOSPHATE 10 MG/ML IJ SOLN
10.0000 mg | Freq: Once | INTRAMUSCULAR | Status: AC
  Administered 2013-09-03: 10 mg via INTRAVENOUS

## 2013-09-03 MED ORDER — ONDANSETRON 8 MG/NS 50 ML IVPB
INTRAVENOUS | Status: AC
  Filled 2013-09-03: qty 8

## 2013-09-03 MED ORDER — SODIUM CHLORIDE 0.9 % IV SOLN
120.0000 mg/m2 | Freq: Once | INTRAVENOUS | Status: AC
  Administered 2013-09-03: 240 mg via INTRAVENOUS
  Filled 2013-09-03: qty 12

## 2013-09-03 MED ORDER — SODIUM CHLORIDE 0.9 % IV SOLN
Freq: Once | INTRAVENOUS | Status: AC
  Administered 2013-09-03: 09:00:00 via INTRAVENOUS

## 2013-09-03 MED ORDER — DEXAMETHASONE SODIUM PHOSPHATE 10 MG/ML IJ SOLN
INTRAMUSCULAR | Status: AC
  Filled 2013-09-03: qty 1

## 2013-09-03 MED ORDER — SODIUM CHLORIDE 0.9 % IJ SOLN
10.0000 mL | INTRAMUSCULAR | Status: DC | PRN
  Administered 2013-09-03: 10 mL
  Filled 2013-09-03: qty 10

## 2013-09-03 MED ORDER — ONDANSETRON 8 MG/50ML IVPB (CHCC)
8.0000 mg | Freq: Once | INTRAVENOUS | Status: AC
  Administered 2013-09-03: 8 mg via INTRAVENOUS

## 2013-09-03 MED ORDER — HEPARIN SOD (PORK) LOCK FLUSH 100 UNIT/ML IV SOLN
500.0000 [IU] | Freq: Once | INTRAVENOUS | Status: AC | PRN
  Administered 2013-09-03: 500 [IU]
  Filled 2013-09-03: qty 5

## 2013-09-03 NOTE — Patient Instructions (Signed)
[facility] Discharge Instructions for Patients Receiving Chemotherapy  Today you received the following chemotherapy agents: Etoposide  To help prevent nausea and vomiting after your treatment, we encourage you to take your Compazine 10 mg every 6 hrs as needed.   If you develop nausea and vomiting that is not controlled by your nausea medication, call the clinic.   BELOW ARE SYMPTOMS THAT SHOULD BE REPORTED IMMEDIATELY:  *FEVER GREATER THAN 100.5 F  *CHILLS WITH OR WITHOUT FEVER  NAUSEA AND VOMITING THAT IS NOT CONTROLLED WITH YOUR NAUSEA MEDICATION  *UNUSUAL SHORTNESS OF BREATH  *UNUSUAL BRUISING OR BLEEDING  TENDERNESS IN MOUTH AND THROAT WITH OR WITHOUT PRESENCE OF ULCERS  *URINARY PROBLEMS  *BOWEL PROBLEMS  UNUSUAL RASH Items with * indicate a potential emergency and should be followed up as soon as possible.  Feel free to call the clinic you have any questions or concerns. The clinic phone number is [phone].

## 2013-09-05 ENCOUNTER — Ambulatory Visit
Admission: RE | Admit: 2013-09-05 | Discharge: 2013-09-05 | Disposition: A | Discharge status: Home / Self Care | Payer: Medicare Other | Source: Ambulatory Visit | Attending: Radiation Oncology | Admitting: Radiation Oncology

## 2013-09-05 ENCOUNTER — Other Ambulatory Visit: Payer: Self-pay | Admitting: Medical Oncology

## 2013-09-05 ENCOUNTER — Ambulatory Visit (HOSPITAL_BASED_OUTPATIENT_CLINIC_OR_DEPARTMENT_OTHER): Payer: Medicare Other

## 2013-09-05 VITALS — BP 98/54 | HR 70 | Temp 97.2°F

## 2013-09-05 DIAGNOSIS — L589 Radiodermatitis, unspecified: Secondary | ICD-10-CM | POA: Diagnosis not present

## 2013-09-05 DIAGNOSIS — Z5111 Encounter for antineoplastic chemotherapy: Secondary | ICD-10-CM

## 2013-09-05 DIAGNOSIS — C3490 Malignant neoplasm of unspecified part of unspecified bronchus or lung: Secondary | ICD-10-CM | POA: Diagnosis not present

## 2013-09-05 DIAGNOSIS — Z510 Encounter for antineoplastic radiation therapy: Secondary | ICD-10-CM | POA: Diagnosis not present

## 2013-09-05 DIAGNOSIS — C3491 Malignant neoplasm of unspecified part of right bronchus or lung: Secondary | ICD-10-CM

## 2013-09-05 DIAGNOSIS — C342 Malignant neoplasm of middle lobe, bronchus or lung: Secondary | ICD-10-CM

## 2013-09-05 DIAGNOSIS — R05 Cough: Secondary | ICD-10-CM | POA: Diagnosis not present

## 2013-09-05 DIAGNOSIS — J040 Acute laryngitis: Secondary | ICD-10-CM | POA: Diagnosis not present

## 2013-09-05 MED ORDER — SODIUM CHLORIDE 0.9 % IJ SOLN
10.0000 mL | INTRAMUSCULAR | Status: DC | PRN
  Administered 2013-09-05: 10 mL
  Filled 2013-09-05: qty 10

## 2013-09-05 MED ORDER — DEXAMETHASONE SODIUM PHOSPHATE 10 MG/ML IJ SOLN
10.0000 mg | Freq: Once | INTRAMUSCULAR | Status: AC
  Administered 2013-09-05: 10 mg via INTRAVENOUS

## 2013-09-05 MED ORDER — ONDANSETRON 8 MG/NS 50 ML IVPB
INTRAVENOUS | Status: AC
  Filled 2013-09-05: qty 8

## 2013-09-05 MED ORDER — HEPARIN SOD (PORK) LOCK FLUSH 100 UNIT/ML IV SOLN
500.0000 [IU] | Freq: Once | INTRAVENOUS | Status: AC | PRN
  Administered 2013-09-05: 500 [IU]
  Filled 2013-09-05: qty 5

## 2013-09-05 MED ORDER — ONDANSETRON 8 MG/50ML IVPB (CHCC)
8.0000 mg | Freq: Once | INTRAVENOUS | Status: AC
  Administered 2013-09-05: 8 mg via INTRAVENOUS

## 2013-09-05 MED ORDER — DEXAMETHASONE SODIUM PHOSPHATE 10 MG/ML IJ SOLN
INTRAMUSCULAR | Status: AC
  Filled 2013-09-05: qty 1

## 2013-09-05 MED ORDER — SODIUM CHLORIDE 0.9 % IV SOLN
Freq: Once | INTRAVENOUS | Status: AC
  Administered 2013-09-05: 15:00:00 via INTRAVENOUS

## 2013-09-05 MED ORDER — SODIUM CHLORIDE 0.9 % IV SOLN
120.0000 mg/m2 | Freq: Once | INTRAVENOUS | Status: AC
  Administered 2013-09-05: 240 mg via INTRAVENOUS
  Filled 2013-09-05: qty 12

## 2013-09-05 NOTE — Patient Instructions (Signed)
[facility] Discharge Instructions for Patients Receiving Chemotherapy  Today you received the following chemotherapy agents Etoposide.  To help prevent nausea and vomiting after your treatment, we encourage you to take your nausea medication as needed. Call your pharmacy before you run out or need a refill.   If you develop nausea and vomiting that is not controlled by your nausea medication, call the clinic.   BELOW ARE SYMPTOMS THAT SHOULD BE REPORTED IMMEDIATELY:  *FEVER GREATER THAN 100.5 F  *CHILLS WITH OR WITHOUT FEVER  NAUSEA AND VOMITING THAT IS NOT CONTROLLED WITH YOUR NAUSEA MEDICATION  *UNUSUAL SHORTNESS OF BREATH  *UNUSUAL BRUISING OR BLEEDING  TENDERNESS IN MOUTH AND THROAT WITH OR WITHOUT PRESENCE OF ULCERS  *URINARY PROBLEMS  *BOWEL PROBLEMS  UNUSUAL RASH Items with * indicate a potential emergency and should be followed up as soon as possible.  Feel free to call the clinic you have any questions or concerns. The clinic phone number is [phone].

## 2013-09-06 ENCOUNTER — Ambulatory Visit (HOSPITAL_BASED_OUTPATIENT_CLINIC_OR_DEPARTMENT_OTHER): Payer: Medicare Other

## 2013-09-06 VITALS — BP 129/57 | HR 81 | Temp 98.2°F | Resp 20

## 2013-09-06 DIAGNOSIS — Z5189 Encounter for other specified aftercare: Secondary | ICD-10-CM

## 2013-09-06 DIAGNOSIS — C3430 Malignant neoplasm of lower lobe, unspecified bronchus or lung: Secondary | ICD-10-CM

## 2013-09-06 MED ORDER — PEGFILGRASTIM INJECTION 6 MG/0.6ML
6.0000 mg | Freq: Once | SUBCUTANEOUS | Status: AC
  Administered 2013-09-06: 6 mg via SUBCUTANEOUS

## 2013-09-08 ENCOUNTER — Ambulatory Visit
Admission: RE | Admit: 2013-09-08 | Discharge: 2013-09-08 | Disposition: A | Discharge status: Home / Self Care | Payer: Medicare Other | Source: Ambulatory Visit | Attending: Radiation Oncology | Admitting: Radiation Oncology

## 2013-09-08 DIAGNOSIS — R05 Cough: Secondary | ICD-10-CM | POA: Diagnosis not present

## 2013-09-08 DIAGNOSIS — J040 Acute laryngitis: Secondary | ICD-10-CM | POA: Diagnosis not present

## 2013-09-08 DIAGNOSIS — C342 Malignant neoplasm of middle lobe, bronchus or lung: Secondary | ICD-10-CM | POA: Diagnosis not present

## 2013-09-08 DIAGNOSIS — Z510 Encounter for antineoplastic radiation therapy: Secondary | ICD-10-CM | POA: Diagnosis not present

## 2013-09-08 DIAGNOSIS — C3490 Malignant neoplasm of unspecified part of unspecified bronchus or lung: Secondary | ICD-10-CM | POA: Diagnosis not present

## 2013-09-08 DIAGNOSIS — L589 Radiodermatitis, unspecified: Secondary | ICD-10-CM | POA: Diagnosis not present

## 2013-09-09 ENCOUNTER — Ambulatory Visit
Admission: RE | Admit: 2013-09-09 | Discharge: 2013-09-09 | Disposition: A | Discharge status: Home / Self Care | Payer: Medicare Other | Source: Ambulatory Visit | Attending: Radiation Oncology | Admitting: Radiation Oncology

## 2013-09-09 ENCOUNTER — Encounter: Payer: Self-pay | Admitting: Radiation Oncology

## 2013-09-09 VITALS — BP 113/54 | HR 82 | Temp 98.9°F | Resp 20 | Wt 178.7 lb

## 2013-09-09 DIAGNOSIS — C3490 Malignant neoplasm of unspecified part of unspecified bronchus or lung: Secondary | ICD-10-CM

## 2013-09-09 DIAGNOSIS — J040 Acute laryngitis: Secondary | ICD-10-CM | POA: Diagnosis not present

## 2013-09-09 DIAGNOSIS — R05 Cough: Secondary | ICD-10-CM | POA: Diagnosis not present

## 2013-09-09 DIAGNOSIS — Z510 Encounter for antineoplastic radiation therapy: Secondary | ICD-10-CM | POA: Diagnosis not present

## 2013-09-09 DIAGNOSIS — C342 Malignant neoplasm of middle lobe, bronchus or lung: Secondary | ICD-10-CM | POA: Diagnosis not present

## 2013-09-09 DIAGNOSIS — L589 Radiodermatitis, unspecified: Secondary | ICD-10-CM | POA: Diagnosis not present

## 2013-09-09 NOTE — Progress Notes (Addendum)
Weekly Management Note Current Dose:18 Gy  Projected Dose: 59.4 Gy   Narrative:  The patient presents for routine under treatment assessment.  CBCT/MVCT images/Port film x-rays were reviewed.  The chart was checked.Minimal dysphagia. RN re-educated on taking meds. No cough. Appetite is good.  Physical Findings:  Unchanged  Vitals:  Filed Vitals:   09/09/13 1514  BP: 113/54  Pulse: 82  Temp: 98.9 F (37.2 C)  Resp: 20   Weight:  Wt Readings from Last 3 Encounters:  09/09/13 178 lb 11.2 oz (81.058 kg)  09/02/13 186 lb 1.6 oz (84.414 kg)  08/27/13 183 lb 14.4 oz (83.416 kg)   Lab Results  Component Value Date   WBC 5.3 09/02/2013   HGB 11.7 09/02/2013   HCT 33.5* 09/02/2013   MCV 93.6 09/02/2013   PLT 234 09/02/2013   Lab Results  Component Value Date   CREATININE 0.8 09/02/2013   BUN 15.5 09/02/2013   NA 142 09/02/2013   K 4.2 09/02/2013   CL 105 07/31/2013   CO2 25 09/02/2013     Impression:  The patient is tolerating radiation.  Plan:  Continue treatment as planned. Needs dietary consult.

## 2013-09-09 NOTE — Progress Notes (Addendum)
Pt reports pain w/swallowing. She is taking Carafate, instructed on proper schedule for medication. Pt took Hycet first time today prior to treatment. She denies cough, has SOB w/exertion. She states she is most hungry in the morning. Reinforced need for protein, reminded pt of list in back of "Radiation and You" booklet. Will have Jacolyn Reedy, Media planner schedule pt to see nutritionist.

## 2013-09-10 ENCOUNTER — Ambulatory Visit (HOSPITAL_BASED_OUTPATIENT_CLINIC_OR_DEPARTMENT_OTHER): Payer: Medicare Other | Admitting: Physician Assistant

## 2013-09-10 ENCOUNTER — Other Ambulatory Visit (HOSPITAL_BASED_OUTPATIENT_CLINIC_OR_DEPARTMENT_OTHER): Payer: Medicare Other

## 2013-09-10 ENCOUNTER — Other Ambulatory Visit: Payer: Medicare Other

## 2013-09-10 ENCOUNTER — Encounter: Payer: Self-pay | Admitting: Physician Assistant

## 2013-09-10 ENCOUNTER — Ambulatory Visit
Admission: RE | Admit: 2013-09-10 | Discharge: 2013-09-10 | Disposition: A | Discharge status: Home / Self Care | Payer: Medicare Other | Source: Ambulatory Visit | Attending: Radiation Oncology | Admitting: Radiation Oncology

## 2013-09-10 ENCOUNTER — Ambulatory Visit: Payer: Medicare Other | Admitting: Internal Medicine

## 2013-09-10 VITALS — BP 128/68 | HR 90 | Temp 96.0°F | Resp 18 | Ht 66.0 in | Wt 179.2 lb

## 2013-09-10 DIAGNOSIS — C3490 Malignant neoplasm of unspecified part of unspecified bronchus or lung: Secondary | ICD-10-CM | POA: Diagnosis not present

## 2013-09-10 DIAGNOSIS — R05 Cough: Secondary | ICD-10-CM | POA: Diagnosis not present

## 2013-09-10 DIAGNOSIS — C342 Malignant neoplasm of middle lobe, bronchus or lung: Secondary | ICD-10-CM

## 2013-09-10 DIAGNOSIS — Z510 Encounter for antineoplastic radiation therapy: Secondary | ICD-10-CM | POA: Diagnosis not present

## 2013-09-10 DIAGNOSIS — L589 Radiodermatitis, unspecified: Secondary | ICD-10-CM | POA: Diagnosis not present

## 2013-09-10 DIAGNOSIS — C3491 Malignant neoplasm of unspecified part of right bronchus or lung: Secondary | ICD-10-CM

## 2013-09-10 DIAGNOSIS — J040 Acute laryngitis: Secondary | ICD-10-CM | POA: Diagnosis not present

## 2013-09-10 LAB — COMPREHENSIVE METABOLIC PANEL (CC13)
ALT: 15 U/L (ref 0–55)
AST: 13 U/L (ref 5–34)
Albumin: 3.7 g/dL (ref 3.5–5.0)
Anion Gap: 13 mEq/L — ABNORMAL HIGH (ref 3–11)
BUN: 28.4 mg/dL — ABNORMAL HIGH (ref 7.0–26.0)
Calcium: 9.3 mg/dL (ref 8.4–10.4)
Chloride: 103 mEq/L (ref 98–109)
Creatinine: 0.8 mg/dL (ref 0.6–1.1)
Sodium: 139 mEq/L (ref 136–145)
Total Bilirubin: 0.57 mg/dL (ref 0.20–1.20)

## 2013-09-10 LAB — CBC WITH DIFFERENTIAL/PLATELET
BASO%: 1.8 % (ref 0.0–2.0)
Basophils Absolute: 0 10*3/uL (ref 0.0–0.1)
EOS%: 0.4 % (ref 0.0–7.0)
HCT: 32 % — ABNORMAL LOW (ref 34.8–46.6)
HGB: 11 g/dL — ABNORMAL LOW (ref 11.6–15.9)
LYMPH%: 40.2 % (ref 14.0–49.7)
MCH: 32.1 pg (ref 25.1–34.0)
MCHC: 34.4 g/dL (ref 31.5–36.0)
MCV: 93.3 fL (ref 79.5–101.0)
NEUT%: 50.8 % (ref 38.4–76.8)
Platelets: 157 10*3/uL (ref 145–400)
RBC: 3.43 10*6/uL — ABNORMAL LOW (ref 3.70–5.45)
lymph#: 0.9 10*3/uL (ref 0.9–3.3)

## 2013-09-10 NOTE — Patient Instructions (Signed)
Continue weekly labs as scheduled Followup with Dr. Arbutus Ped in 2 weeks with a restaging CT scan of your chest to reevaluate your disease

## 2013-09-10 NOTE — Progress Notes (Addendum)
No images are attached to the encounter. No scans are attached to the encounter. No scans are attached to the encounter. [facility] SHARED VISIT PROGRESS NOTE  [name], MD [address] [facility] [place], [place]. [place] [address]  DIAGNOSIS: Small cell lung carcinoma, limited stage   Primary site: Lung (Right)   Staging method: AJCC 7th Edition   Clinical: Stage IIIA (T2a, N2, M0) signed by Si Gaul, MD on 08/06/2013  3:32 PM   Summary: Stage IIIA (T2a, N2, M0)  PRIOR THERAPY: None  CURRENT THERAPY: Systemic chemotherapy with carboplatin for an AUC of 5 given on day 1 and etoposide 120 mg per meter squared on days 1, 2 and 3 with Neulasta support given on day 4. This given concurrent with radiation. Status post 2 cycles  DISEASE STAGE: Small cell lung carcinoma, limited stage   Primary site: Lung (Right)   Staging method: AJCC 7th Edition   Clinical: Stage IIIA (T2a, N2, M0) signed by Si Gaul, MD on 08/06/2013  3:32 PM   Summary: Stage IIIA (T2a, N2, M0)  CHEMOTHERAPY INTENT: Palliative  CURRENT # OF CHEMOTHERAPY CYCLES: 2  CURRENT ANTIEMETICS: Zofran, dexamethasone and Compazine  CURRENT SMOKING STATUS: Former smoker, quit 09/12/1991  ORAL CHEMOTHERAPY AND CONSENT: N/A  CURRENT BISPHOSPHONATES USE: None  PAIN MANAGEMENT: Hydrocodone and acetaminophen, tramadol  NARCOTICS INDUCED CONSTIPATION: Mild  LIVING WILL AND CODE STATUS: ?   INTERVAL HISTORY: Elizabeth Humphrey 79 y.o. female returns for a scheduled regular office visit for followup of recently diagnosed limited stage small cell lung cancer. She status post 2 cycle of systemic chemotherapy with carboplatin and etoposide with Neulasta support. She is also currently undergoing palliative radiotherapy under the care Dr. Roselind Messier. Overall she is tolerating her chemotherapy relatively well with the exception of some mild episodes of constipation and fatigue.  She voiced  no other specific complaints.   MEDICAL HISTORY: Past Medical History  Diagnosis Date  . Allergy     bee stings/anaphylaxis  . Hyperlipidemia     hx of  . Lung cancer   . COPD (chronic obstructive pulmonary disease) 07/30/13  . Heart murmur   . History of pneumonia   . Arthritis     ALLERGIES:  is allergic to neosporin.  MEDICATIONS:  Current Outpatient Prescriptions  Medication Sig Dispense Refill  . aspirin 81 MG tablet Take 81 mg by mouth daily.      [name] emollient (BIAFINE) cream Apply 1 application topically 2 (two) times daily. Apply to affected skin area once skin becomes irriatated or red,itching, after rad txs then daily and bedtime      . lidocaine-prilocaine (EMLA) cream Apply to Port-A-Cath one to 2 hours prior to chemotherapy as directed  30 g  1  . naproxen sodium (ANAPROX) 220 MG tablet Take 440 mg by mouth daily.       . simvastatin (ZOCOR) 40 MG tablet Take 40 mg by mouth at bedtime.      . sucralfate (CARAFATE) 1 G tablet Take 1 tablet (1 g total) by mouth 4 (four) times daily -  with meals and at bedtime.  120 tablet  1  . traMADol (ULTRAM) 50 MG tablet Take 1 tablet by mouth daily.       [name] HYDROcodone-acetaminophen (HYCET) 7.5-325 mg/15 ml solution Take 10 mLs by mouth 4 (four) times daily as needed for moderate pain.  120 mL  0  . mupirocin ointment (BACTROBAN) 2 % Apply 1 application topically as needed.  For cuts and scrapes      . prochlorperazine (COMPAZINE) 10 MG tablet Take 1 tablet (10 mg total) by mouth every 6 (six) hours as needed for nausea or vomiting.  60 tablet  0   No current facility-administered medications for this visit.    SURGICAL HISTORY:  Past Surgical History  Procedure Laterality Date  . Dilation and curettage of uterus  yrs ago  . Tonsillectomy    . Cataract extraction      both eyes  . Video bronchoscopy with endobronchial ultrasound N/A 08/04/2013    Procedure: VIDEO BRONCHOSCOPY WITH ENDOBRONCHIAL ULTRASOUND;  Surgeon: Loreli Slot, MD;  Location: [facility] OR;  Service: Thoracic;  Laterality: N/A;    REVIEW OF SYSTEMS:  Constitutional: positive for fatigue Eyes: negative Ears, nose, mouth, throat, and face: negative Respiratory: negative Cardiovascular: negative Gastrointestinal: positive for constipation Genitourinary:negative Integument/breast: negative Hematologic/lymphatic: negative Musculoskeletal:negative Neurological: negative Behavioral/Psych: negative Endocrine: negative Allergic/Immunologic: negative   PHYSICAL EXAMINATION: General appearance: alert, cooperative, appears stated age and no distress Head: Normocephalic, without obvious abnormality, atraumatic Neck: no adenopathy, no carotid bruit, no JVD, supple, symmetrical, trachea midline and thyroid not enlarged, symmetric, no tenderness/mass/nodules Lymph nodes: Cervical, supraclavicular, and axillary nodes normal. Resp: clear to auscultation bilaterally Back: symmetric, no curvature. ROM normal. No CVA tenderness. Cardio: regular rate and rhythm, S1, S2 normal, no murmur, click, rub or gallop GI: soft, non-tender; bowel sounds normal; no masses,  no organomegaly Extremities: extremities normal, atraumatic, no cyanosis or edema Neurologic: Alert and oriented X 3, normal strength and tone. Normal symmetric reflexes. Normal coordination and gait  ECOG PERFORMANCE STATUS: 1 - Symptomatic but completely ambulatory  Blood pressure 128/68, pulse 90, temperature 96 F (35.6 C), temperature source Oral, resp. rate 18, height 5\' 6"  (1.676 m), weight 179 lb 3.2 oz (81.285 kg).  LABORATORY DATA: Lab Results  Component Value Date   WBC 2.2* 09/10/2013   HGB 11.0* 09/10/2013   HCT 32.0* 09/10/2013   MCV 93.3 09/10/2013   PLT 157 09/10/2013      Chemistry      Component Value Date/Time   NA 139 09/10/2013 1251   NA 142 07/31/2013 1300   K 3.6 09/10/2013 1251   K 4.1 07/31/2013 1300   CL 105 07/31/2013 1300   CO2 23 09/10/2013 1251   CO2  30 07/31/2013 1300   BUN 28.4* 09/10/2013 1251   BUN 23 07/31/2013 1300   CREATININE 0.8 09/10/2013 1251   CREATININE 0.76 07/31/2013 1300      Component Value Date/Time   CALCIUM 9.3 09/10/2013 1251   CALCIUM 9.4 07/31/2013 1300   ALKPHOS 64 09/10/2013 1251   ALKPHOS 60 07/31/2013 1300   AST 13 09/10/2013 1251   AST 21 07/31/2013 1300   ALT 15 09/10/2013 1251   ALT 20 07/31/2013 1300   BILITOT 0.57 09/10/2013 1251   BILITOT 0.4 07/31/2013 1300       RADIOGRAPHIC STUDIES:  Dg Chest 2 View Within Previous 72 Hours.  Films Obtained On Friday Are Acceptable For Monday And Tuesday Cases  08/04/2013   CLINICAL DATA:  Lung cancer.  EXAM: CHEST  2 VIEW  COMPARISON:  PET-CT 07/24/2013.  FINDINGS: Mediastinum is unremarkable. Right hilar bullous noted consistent with adenopathy. Right middle lobe mass again noted consistent with malignancy. This was PET positive on prior PET-CT. No focal infiltrate to suggest pneumonia. No pleural effusion or pneumothorax. Heart size and pulmonary vascularity normal. No acute osseous abnormality.  IMPRESSION: 1. Persistent mass lesion  in the right middle lobe. Associated persistent right hilar adenopathy. These findings were PET positive on prior PET-CT of 07/24/2013. These findings consistent with patient's known diagnosis of lung cancer. 2. No acute abnormality .   Electronically Signed   By: Maisie Fus  Register   On: 08/04/2013 10:22   Mr Laqueta Jean Wo Contrast  08/13/2013   CLINICAL DATA:  Small cell lung cancer.  EXAM: MRI HEAD WITHOUT AND WITH CONTRAST  TECHNIQUE: Multiplanar, multiecho pulse sequences of the brain and surrounding structures were obtained without and with intravenous contrast.  CONTRAST:  20mL MULTIHANCE GADOBENATE DIMEGLUMINE 529 MG/ML IV SOLN  COMPARISON:  None.  FINDINGS: Moderate generalized atrophy is present. Extensive periventricular and subcortical white matter disease is present bilaterally. No acute infarct, hemorrhage, or mass lesion is  present. A relatively empty sella is noted.  The postcontrast images demonstrate no pathologic enhancement to suggest metastatic disease of the brain or meninges.  Flow is present in the major intracranial arteries. The patient is status post bilateral lens extractions. The ventricles are of normal size. No significant extra-axial fluid collection is present. Minimal mucosal thickening is present in the ethmoid air cells. The mastoid air cells are clear.  Note is made of hyperostosis frontalis internus without evidence for osseous metastases.  IMPRESSION: 1. No evidence for metastatic disease of the brain or meninges. 2. Moderate generalized atrophy and diffuse white matter disease. This likely reflects the sequela of chronic microvascular ischemia.   Electronically Signed   By: Gennette Pac M.D.   On: 08/13/2013 21:25   Ir Fluoro Guide Cv Line Right  08/28/2013   INDICATION: History of small cell lung cancer, in need of intravenous access for chemotherapy administration  EXAM: IMPLANTED PORT A CATH PLACEMENT WITH ULTRASOUND AND FLUOROSCOPIC GUIDANCE  MEDICATIONS: Ancef 2 gm IV; IV antibiotic was given in an appropriate time interval prior to skin puncture.  ANESTHESIA/SEDATION: Versed 2 mg IV; Fentanyl 100 mcg IV;  Total Moderate Sedation Time  30  minutes.  CONTRAST:  None  COMPARISON:  None.  FLUOROSCOPY TIME:  30 seconds  PROCEDURE: The procedure, risks, benefits, and alternatives were explained to the patient. Questions regarding the procedure were encouraged and answered. The patient understands and consents to the procedure.  The right neck and chest were prepped with chlorhexidine in a sterile fashion, and a sterile drape was applied covering the operative field. Maximum barrier sterile technique with sterile gowns and gloves were used for the procedure. A timeout was performed prior to the initiation of the procedure. Local anesthesia was provided with 1% lidocaine with epinephrine.  After creating a  small venotomy incision, a micropuncture kit was utilized to access the internal jugular vein under direct, real-time ultrasound guidance. Ultrasound image documentation was performed. The microwire was kinked to measure appropriate catheter length.  A subcutaneous port pocket was then created along the upper chest wall utilizing a combination of sharp and blunt dissection. The pocket was irrigated with sterile saline. A single lumen ISP power injectable port was chosen for placement. The 8 Fr catheter was tunneled from the port pocket site to the venotomy incision. The port was placed in the pocket. The external catheter was trimmed to appropriate length. At the venotomy, an 8 Fr peel-away sheath was placed over a guidewire under fluoroscopic guidance. The catheter was then placed through the sheath and the sheath was removed. Final catheter positioning was confirmed and documented with a fluoroscopic spot radiograph. The port was accessed with a Demetrios Isaacs needle,  aspirated and flushed with heparinized saline.  The venotomy site was closed with an interrupted 4-0 Vicryl suture. The port pocket incision was closed with interrupted 2-0 Vicryl suture and the skin was opposed with a running subcuticular 4-0 Vicryl suture. Dermabond and Steri-strips were applied to both incisions. Dressings were placed. The patient tolerated the procedure well without immediate post procedural complication.  COMPLICATIONS: None immediate  FINDINGS: After catheter placement, the tip lies within the superior cavoatrial junction. The catheter aspirates and flushes normally and is ready for immediate use.  IMPRESSION: Successful placement of a right internal jugular approach power injectable Port-A-Cath. The catheter is ready for immediate use.   Electronically Signed   By: Simonne Come M.D.   On: 08/28/2013 10:28   Ir US Guide Vasc Access Right  08/28/2013   INDICATION: History of small cell lung cancer, in need of intravenous access for  chemotherapy administration  EXAM: IMPLANTED PORT A CATH PLACEMENT WITH ULTRASOUND AND FLUOROSCOPIC GUIDANCE  MEDICATIONS: Ancef 2 gm IV; IV antibiotic was given in an appropriate time interval prior to skin puncture.  ANESTHESIA/SEDATION: Versed 2 mg IV; Fentanyl 100 mcg IV;  Total Moderate Sedation Time  30  minutes.  CONTRAST:  None  COMPARISON:  None.  FLUOROSCOPY TIME:  30 seconds  PROCEDURE: The procedure, risks, benefits, and alternatives were explained to the patient. Questions regarding the procedure were encouraged and answered. The patient understands and consents to the procedure.  The right neck and chest were prepped with chlorhexidine in a sterile fashion, and a sterile drape was applied covering the operative field. Maximum barrier sterile technique with sterile gowns and gloves were used for the procedure. A timeout was performed prior to the initiation of the procedure. Local anesthesia was provided with 1% lidocaine with epinephrine.  After creating a small venotomy incision, a micropuncture kit was utilized to access the internal jugular vein under direct, real-time ultrasound guidance. Ultrasound image documentation was performed. The microwire was kinked to measure appropriate catheter length.  A subcutaneous port pocket was then created along the upper chest wall utilizing a combination of sharp and blunt dissection. The pocket was irrigated with sterile saline. A single lumen ISP power injectable port was chosen for placement. The 8 Fr catheter was tunneled from the port pocket site to the venotomy incision. The port was placed in the pocket. The external catheter was trimmed to appropriate length. At the venotomy, an 8 Fr peel-away sheath was placed over a guidewire under fluoroscopic guidance. The catheter was then placed through the sheath and the sheath was removed. Final catheter positioning was confirmed and documented with a fluoroscopic spot radiograph. The port was accessed with a  Huber needle, aspirated and flushed with heparinized saline.  The venotomy site was closed with an interrupted 4-0 Vicryl suture. The port pocket incision was closed with interrupted 2-0 Vicryl suture and the skin was opposed with a running subcuticular 4-0 Vicryl suture. Dermabond and Steri-strips were applied to both incisions. Dressings were placed. The patient tolerated the procedure well without immediate post procedural complication.  COMPLICATIONS: None immediate  FINDINGS: After catheter placement, the tip lies within the superior cavoatrial junction. The catheter aspirates and flushes normally and is ready for immediate use.  IMPRESSION: Successful placement of a right internal jugular approach power injectable Port-A-Cath. The catheter is ready for immediate use.   Electronically Signed   By: Simonne Come M.D.   On: 08/28/2013 10:28     ASSESSMENT/PLAN: Patient is a very  pleasant 79 year old Caucasian female recently diagnosed with limited stage small cell lung cancer. She is status post 2 cycles of systemic chemotherapy with carboplatin and etoposide with Neulasta support. She is also undergoing radiation therapy under the care Dr. Roselind Messier. Thus far she is tolerating both relatively well. Patient was discussed with and also seen by Dr. Arbutus Ped. She'll proceed with weekly labs as scheduled. She will followup with Dr. Arbutus Ped in 2 weeks with a restaging CT scan of her chest with contrast to reevaluate her disease. She'll also be due for cycle #3 of her systemic chemotherapy with carboplatin and etoposide with Neulasta support when she returns in 2 weeks. Her ANC is slightly low today at 1.1 and the patient was given neutropenic precautions and voiced understanding of these precautions.    Elizabeth Humphrey, Elizabeth Maragh E, PA-C     All questions were answered. The patient knows to call the clinic with any problems, questions or concerns. We can certainly see the patient much sooner if necessary. ADDENDUM:   Hematology/Oncology Attending:  I had the face-to-face encounter with the patient. I recommended his care plan. This is a very pleasant 79 years old white female recently diagnosed with limited stage small cell lung cancer currently undergoing systemic chemotherapy with carboplatin and etoposide concurrent with radiation. She is status post 2 cycles and tolerated the first cycle of her treatment fairly well with no significant adverse effects except for alopecia and mild fatigue.she started cycle #2 last week.  I will arrange for the patient to have repeat CT scan of the chest for restaging of her disease before starting cycle #3. She would come back for follow up visit in 2 weeks for reevaluation. She was advised to call immediately if she has any concerning symptoms in the interval. Lajuana Matte., MD 09/11/2013

## 2013-09-12 ENCOUNTER — Ambulatory Visit
Admission: RE | Admit: 2013-09-12 | Discharge: 2013-09-12 | Disposition: A | Discharge status: Home / Self Care | Payer: Medicare Other | Source: Ambulatory Visit | Attending: Radiation Oncology | Admitting: Radiation Oncology

## 2013-09-12 ENCOUNTER — Telehealth: Payer: Self-pay | Admitting: Physician Assistant

## 2013-09-12 ENCOUNTER — Telehealth: Payer: Self-pay | Admitting: Internal Medicine

## 2013-09-12 DIAGNOSIS — L589 Radiodermatitis, unspecified: Secondary | ICD-10-CM | POA: Diagnosis not present

## 2013-09-12 DIAGNOSIS — C3490 Malignant neoplasm of unspecified part of unspecified bronchus or lung: Secondary | ICD-10-CM | POA: Diagnosis not present

## 2013-09-12 DIAGNOSIS — C342 Malignant neoplasm of middle lobe, bronchus or lung: Secondary | ICD-10-CM | POA: Diagnosis not present

## 2013-09-12 DIAGNOSIS — K209 Esophagitis, unspecified without bleeding: Secondary | ICD-10-CM | POA: Diagnosis not present

## 2013-09-12 DIAGNOSIS — R05 Cough: Secondary | ICD-10-CM | POA: Diagnosis not present

## 2013-09-12 DIAGNOSIS — Z510 Encounter for antineoplastic radiation therapy: Secondary | ICD-10-CM | POA: Diagnosis not present

## 2013-09-12 DIAGNOSIS — R059 Cough, unspecified: Secondary | ICD-10-CM | POA: Diagnosis not present

## 2013-09-12 DIAGNOSIS — Z7982 Long term (current) use of aspirin: Secondary | ICD-10-CM | POA: Diagnosis not present

## 2013-09-12 DIAGNOSIS — Z79899 Other long term (current) drug therapy: Secondary | ICD-10-CM | POA: Diagnosis not present

## 2013-09-12 DIAGNOSIS — J040 Acute laryngitis: Secondary | ICD-10-CM | POA: Diagnosis not present

## 2013-09-12 DIAGNOSIS — R5381 Other malaise: Secondary | ICD-10-CM | POA: Diagnosis not present

## 2013-09-12 NOTE — Telephone Encounter (Signed)
pt came by and picked up calendar for Jan..pt was perturbed that she had a nutrition consult and a CT scan that she knew nothing about..I tried to explain I knew nothing about them either as I believe these were scheduled by a radiation oncology staff vs medical onc staff..in particular a Romie Jumper which I did not know who this employee was...[facility]

## 2013-09-12 NOTE — Telephone Encounter (Signed)
LVMM adv pt txts added for Jan as well as labs and drs appts Suggested pt come by and get a copy of calendar when here today for tx and/or look at my chart for new appts shh

## 2013-09-15 ENCOUNTER — Ambulatory Visit
Admission: RE | Admit: 2013-09-15 | Discharge: 2013-09-15 | Disposition: A | Discharge status: Home / Self Care | Payer: Medicare Other | Source: Ambulatory Visit | Attending: Radiation Oncology | Admitting: Radiation Oncology

## 2013-09-15 DIAGNOSIS — Z510 Encounter for antineoplastic radiation therapy: Secondary | ICD-10-CM | POA: Diagnosis not present

## 2013-09-15 DIAGNOSIS — L589 Radiodermatitis, unspecified: Secondary | ICD-10-CM | POA: Diagnosis not present

## 2013-09-15 DIAGNOSIS — R059 Cough, unspecified: Secondary | ICD-10-CM | POA: Diagnosis not present

## 2013-09-15 DIAGNOSIS — C342 Malignant neoplasm of middle lobe, bronchus or lung: Secondary | ICD-10-CM | POA: Diagnosis not present

## 2013-09-15 DIAGNOSIS — K209 Esophagitis, unspecified without bleeding: Secondary | ICD-10-CM | POA: Diagnosis not present

## 2013-09-15 DIAGNOSIS — J040 Acute laryngitis: Secondary | ICD-10-CM | POA: Diagnosis not present

## 2013-09-15 DIAGNOSIS — C3490 Malignant neoplasm of unspecified part of unspecified bronchus or lung: Secondary | ICD-10-CM | POA: Diagnosis not present

## 2013-09-15 DIAGNOSIS — R05 Cough: Secondary | ICD-10-CM | POA: Diagnosis not present

## 2013-09-16 ENCOUNTER — Encounter: Payer: Self-pay | Admitting: Radiation Oncology

## 2013-09-16 ENCOUNTER — Ambulatory Visit
Admission: RE | Admit: 2013-09-16 | Discharge: 2013-09-16 | Disposition: A | Discharge status: Home / Self Care | Payer: Medicare Other | Source: Ambulatory Visit | Attending: Radiation Oncology | Admitting: Radiation Oncology

## 2013-09-16 VITALS — BP 106/90 | HR 98 | Temp 99.1°F | Resp 20 | Wt 181.1 lb

## 2013-09-16 DIAGNOSIS — L589 Radiodermatitis, unspecified: Secondary | ICD-10-CM | POA: Diagnosis not present

## 2013-09-16 DIAGNOSIS — Z510 Encounter for antineoplastic radiation therapy: Secondary | ICD-10-CM | POA: Diagnosis not present

## 2013-09-16 DIAGNOSIS — J040 Acute laryngitis: Secondary | ICD-10-CM | POA: Diagnosis not present

## 2013-09-16 DIAGNOSIS — R059 Cough, unspecified: Secondary | ICD-10-CM | POA: Diagnosis not present

## 2013-09-16 DIAGNOSIS — C3490 Malignant neoplasm of unspecified part of unspecified bronchus or lung: Secondary | ICD-10-CM

## 2013-09-16 DIAGNOSIS — R05 Cough: Secondary | ICD-10-CM | POA: Diagnosis not present

## 2013-09-16 DIAGNOSIS — C342 Malignant neoplasm of middle lobe, bronchus or lung: Secondary | ICD-10-CM | POA: Diagnosis not present

## 2013-09-16 DIAGNOSIS — K209 Esophagitis, unspecified without bleeding: Secondary | ICD-10-CM | POA: Diagnosis not present

## 2013-09-16 NOTE — Progress Notes (Signed)
[facility]     Rexene Edison, M.D. [place], [place] [address]               Blair Promise, M.D., Ph.D. Phone: [phone]      [place] A. Tammi Klippel, M.D. Fax: [phone]      Jodelle Gross, M.D., Ph.D.         Thea Silversmith, M.D.         Wyvonnia Lora, M.D Weekly Treatment Management Note  Name: Elizabeth Humphrey     MRN: [id number]        CSN: [id number] Date: 09/16/2013      DOB: 1934-06-04  CC: Geoffery Lyons, MD         Reynaldo Minium    Status: Outpatient  Diagnosis: The encounter diagnosis was Small cell lung carcinoma, unspecified laterality.  Current Dose: 25.2 Gy  Current Fraction: 14  Planned Dose: 59.4 Gy  Narrative: Elizabeth Humphrey was seen today for weekly treatment management. The chart was checked and MVCT  were reviewed. She is starting to have some fatigue. She does have some esophageal problems but continues to use Carafate. She does have a medication for this issue in addition. She has had some problems with nausea presumably related to her chemotherapy.  Neosporin Current Outpatient Prescriptions  Medication Sig Dispense Refill  . aspirin 81 MG tablet Take 81 mg by mouth daily.      [name] emollient (BIAFINE) cream Apply 1 application topically 2 (two) times daily. Apply to affected skin area once skin becomes irriatated or red,itching, after rad txs then daily and bedtime      . HYDROcodone-acetaminophen (HYCET) 7.5-325 mg/15 ml solution Take 10 mLs by mouth 4 (four) times daily as needed for moderate pain.  120 mL  0  . lidocaine-prilocaine (EMLA) cream Apply to Port-A-Cath one to 2 hours prior to chemotherapy as directed  30 g  1  . mupirocin ointment (BACTROBAN) 2 % Apply 1 application topically as needed. For cuts and scrapes      . naproxen sodium (ANAPROX) 220 MG tablet Take 440 mg by mouth daily.       . prochlorperazine (COMPAZINE) 10 MG tablet Take 1 tablet (10 mg total) by mouth every 6 (six)  hours as needed for nausea or vomiting.  60 tablet  0  . simvastatin (ZOCOR) 40 MG tablet Take 40 mg by mouth at bedtime.      . sucralfate (CARAFATE) 1 G tablet Take 1 tablet (1 g total) by mouth 4 (four) times daily -  with meals and at bedtime.  120 tablet  1  . traMADol (ULTRAM) 50 MG tablet Take 1 tablet by mouth daily.        No current facility-administered medications for this encounter.   Labs:  Lab Results  Component Value Date   WBC 2.2* 09/10/2013   HGB 11.0* 09/10/2013   HCT 32.0* 09/10/2013   MCV 93.3 09/10/2013   PLT 157 09/10/2013   Lab Results  Component Value Date   CREATININE 0.8 09/10/2013   BUN 28.4* 09/10/2013   NA 139 09/10/2013   K 3.6 09/10/2013   CL 105 07/31/2013   CO2 23 09/10/2013   Lab Results  Component Value Date   ALT 15 09/10/2013   AST 13 09/10/2013   BILITOT 0.57 09/10/2013    Physical Examination:  Filed Vitals:   09/16/13 1231  BP: 106/90  Pulse: 98  Temp: 99.1 F (37.3 C)  Resp: 20    Wt Readings from Last 3 Encounters:  09/16/13 181 lb 1.6 oz (82.146 kg)  09/10/13 179 lb 3.2 oz (81.285 kg)  09/09/13 178 lb 11.2 oz (81.058 kg)     Lungs - Normal respiratory effort, chest expands symmetrically. Lungs are clear to auscultation, no crackles or wheezes.  Heart has regular rhythm and rate  Abdomen is soft and non tender with normal bowel sounds  Assessment:  Patient tolerating treatments well except for above issues  Plan: Continue treatment per original radiation prescription

## 2013-09-16 NOTE — Progress Notes (Signed)
Weekly rad txs, rt lung, starting of dry rash mild erythema on front and back of chest,will start using biafine today, threw up last night in bathtub, after eating sweet/sour chicken, too0k nausea med last night and this am, takes carafte hlps, some difficuklty swallowing solids, had soft boil egg and toast this am, no coughing dizzy at times, had back pain and stomach pain yesterday, resolved,  Some fatigue 12:37 PM  12:36 PM

## 2013-09-17 ENCOUNTER — Other Ambulatory Visit (HOSPITAL_BASED_OUTPATIENT_CLINIC_OR_DEPARTMENT_OTHER): Payer: Medicare Other

## 2013-09-17 ENCOUNTER — Ambulatory Visit
Admission: RE | Admit: 2013-09-17 | Discharge: 2013-09-17 | Disposition: A | Discharge status: Home / Self Care | Payer: Medicare Other | Source: Ambulatory Visit | Attending: Radiation Oncology | Admitting: Radiation Oncology

## 2013-09-17 DIAGNOSIS — K209 Esophagitis, unspecified without bleeding: Secondary | ICD-10-CM | POA: Diagnosis not present

## 2013-09-17 DIAGNOSIS — C342 Malignant neoplasm of middle lobe, bronchus or lung: Secondary | ICD-10-CM | POA: Diagnosis not present

## 2013-09-17 DIAGNOSIS — Z510 Encounter for antineoplastic radiation therapy: Secondary | ICD-10-CM | POA: Diagnosis not present

## 2013-09-17 DIAGNOSIS — J040 Acute laryngitis: Secondary | ICD-10-CM | POA: Diagnosis not present

## 2013-09-17 DIAGNOSIS — R059 Cough, unspecified: Secondary | ICD-10-CM | POA: Diagnosis not present

## 2013-09-17 DIAGNOSIS — L589 Radiodermatitis, unspecified: Secondary | ICD-10-CM | POA: Diagnosis not present

## 2013-09-17 DIAGNOSIS — R05 Cough: Secondary | ICD-10-CM | POA: Diagnosis not present

## 2013-09-17 DIAGNOSIS — C3490 Malignant neoplasm of unspecified part of unspecified bronchus or lung: Secondary | ICD-10-CM | POA: Diagnosis not present

## 2013-09-17 DIAGNOSIS — C3491 Malignant neoplasm of unspecified part of right bronchus or lung: Secondary | ICD-10-CM

## 2013-09-17 LAB — COMPREHENSIVE METABOLIC PANEL (CC13)
ALT: 18 U/L (ref 0–55)
ANION GAP: 8 meq/L (ref 3–11)
AST: 22 U/L (ref 5–34)
Albumin: 3.5 g/dL (ref 3.5–5.0)
Alkaline Phosphatase: 62 U/L (ref 40–150)
BUN: 20.7 mg/dL (ref 7.0–26.0)
CALCIUM: 9.1 mg/dL (ref 8.4–10.4)
CO2: 26 meq/L (ref 22–29)
Chloride: 103 mEq/L (ref 98–109)
Creatinine: 0.8 mg/dL (ref 0.6–1.1)
Glucose: 129 mg/dl (ref 70–140)
Potassium: 3.7 mEq/L (ref 3.5–5.1)
Sodium: 137 mEq/L (ref 136–145)
Total Bilirubin: 0.55 mg/dL (ref 0.20–1.20)
Total Protein: 6.7 g/dL (ref 6.4–8.3)

## 2013-09-17 LAB — CBC WITH DIFFERENTIAL/PLATELET
BASO%: 0.2 % (ref 0.0–2.0)
BASOS ABS: 0 10*3/uL (ref 0.0–0.1)
EOS ABS: 0 10*3/uL (ref 0.0–0.5)
EOS%: 0.3 % (ref 0.0–7.0)
HCT: 27.4 % — ABNORMAL LOW (ref 34.8–46.6)
HEMOGLOBIN: 9.5 g/dL — AB (ref 11.6–15.9)
LYMPH#: 0.3 10*3/uL — AB (ref 0.9–3.3)
LYMPH%: 10 % — ABNORMAL LOW (ref 14.0–49.7)
MCH: 32.1 pg (ref 25.1–34.0)
MCHC: 34.7 g/dL (ref 31.5–36.0)
MCV: 92.5 fL (ref 79.5–101.0)
MONO#: 0.4 10*3/uL (ref 0.1–0.9)
MONO%: 11.5 % (ref 0.0–14.0)
NEUT%: 78 % — ABNORMAL HIGH (ref 38.4–76.8)
NEUTROS ABS: 2.5 10*3/uL (ref 1.5–6.5)
Platelets: 25 10*3/uL — ABNORMAL LOW (ref 145–400)
RBC: 2.96 10*6/uL — ABNORMAL LOW (ref 3.70–5.45)
RDW: 15.1 % — AB (ref 11.2–14.5)
WBC: 3.2 10*3/uL — ABNORMAL LOW (ref 3.9–10.3)

## 2013-09-18 ENCOUNTER — Ambulatory Visit
Admission: RE | Admit: 2013-09-18 | Discharge: 2013-09-18 | Disposition: A | Discharge status: Home / Self Care | Payer: Medicare Other | Source: Ambulatory Visit | Attending: Radiation Oncology | Admitting: Radiation Oncology

## 2013-09-18 DIAGNOSIS — C342 Malignant neoplasm of middle lobe, bronchus or lung: Secondary | ICD-10-CM | POA: Diagnosis not present

## 2013-09-18 DIAGNOSIS — R05 Cough: Secondary | ICD-10-CM | POA: Diagnosis not present

## 2013-09-18 DIAGNOSIS — J040 Acute laryngitis: Secondary | ICD-10-CM | POA: Diagnosis not present

## 2013-09-18 DIAGNOSIS — L589 Radiodermatitis, unspecified: Secondary | ICD-10-CM | POA: Diagnosis not present

## 2013-09-18 DIAGNOSIS — K209 Esophagitis, unspecified without bleeding: Secondary | ICD-10-CM | POA: Diagnosis not present

## 2013-09-18 DIAGNOSIS — R059 Cough, unspecified: Secondary | ICD-10-CM | POA: Diagnosis not present

## 2013-09-18 DIAGNOSIS — C3490 Malignant neoplasm of unspecified part of unspecified bronchus or lung: Secondary | ICD-10-CM | POA: Diagnosis not present

## 2013-09-18 DIAGNOSIS — Z510 Encounter for antineoplastic radiation therapy: Secondary | ICD-10-CM | POA: Diagnosis not present

## 2013-09-19 ENCOUNTER — Ambulatory Visit (HOSPITAL_COMMUNITY)
Admission: RE | Admit: 2013-09-19 | Discharge: 2013-09-19 | Disposition: A | Discharge status: Home / Self Care | Payer: Medicare Other | Source: Ambulatory Visit | Attending: Physician Assistant | Admitting: Physician Assistant

## 2013-09-19 ENCOUNTER — Ambulatory Visit
Admission: RE | Admit: 2013-09-19 | Discharge: 2013-09-19 | Disposition: A | Discharge status: Home / Self Care | Payer: Medicare Other | Source: Ambulatory Visit | Attending: Radiation Oncology | Admitting: Radiation Oncology

## 2013-09-19 DIAGNOSIS — R911 Solitary pulmonary nodule: Secondary | ICD-10-CM | POA: Diagnosis not present

## 2013-09-19 DIAGNOSIS — L589 Radiodermatitis, unspecified: Secondary | ICD-10-CM | POA: Diagnosis not present

## 2013-09-19 DIAGNOSIS — J040 Acute laryngitis: Secondary | ICD-10-CM | POA: Diagnosis not present

## 2013-09-19 DIAGNOSIS — J984 Other disorders of lung: Secondary | ICD-10-CM | POA: Diagnosis not present

## 2013-09-19 DIAGNOSIS — Z510 Encounter for antineoplastic radiation therapy: Secondary | ICD-10-CM | POA: Diagnosis not present

## 2013-09-19 DIAGNOSIS — R599 Enlarged lymph nodes, unspecified: Secondary | ICD-10-CM | POA: Diagnosis not present

## 2013-09-19 DIAGNOSIS — C342 Malignant neoplasm of middle lobe, bronchus or lung: Secondary | ICD-10-CM | POA: Diagnosis not present

## 2013-09-19 DIAGNOSIS — K209 Esophagitis, unspecified without bleeding: Secondary | ICD-10-CM | POA: Diagnosis not present

## 2013-09-19 DIAGNOSIS — C3490 Malignant neoplasm of unspecified part of unspecified bronchus or lung: Secondary | ICD-10-CM | POA: Insufficient documentation

## 2013-09-19 DIAGNOSIS — I2510 Atherosclerotic heart disease of native coronary artery without angina pectoris: Secondary | ICD-10-CM | POA: Insufficient documentation

## 2013-09-19 DIAGNOSIS — I700 Atherosclerosis of aorta: Secondary | ICD-10-CM | POA: Diagnosis not present

## 2013-09-19 DIAGNOSIS — R05 Cough: Secondary | ICD-10-CM | POA: Diagnosis not present

## 2013-09-19 DIAGNOSIS — R059 Cough, unspecified: Secondary | ICD-10-CM | POA: Diagnosis not present

## 2013-09-19 MED ORDER — IOHEXOL 300 MG/ML  SOLN
80.0000 mL | Freq: Once | INTRAMUSCULAR | Status: AC | PRN
  Administered 2013-09-19: 80 mL via INTRAVENOUS

## 2013-09-19 NOTE — Progress Notes (Signed)
Elizabeth Humphrey came to the clinic and said she has a "cyst" in her vaginal area.  She said "I need an antibiotic."  She reports having trouble urinating during the night.  She was up 9 times urinating small amounts.  She had read in the "Radiation and You" book that radiation can cause cystitis.  Advised her that we are treating her right lung and that would not cause the cyst in her vaginal area.  Advised her to contact her primary care doctor today and if she is not able to be seen to go to urgent care.  She verbalized agreement.

## 2013-09-22 ENCOUNTER — Ambulatory Visit: Payer: Medicare Other | Admitting: Nutrition

## 2013-09-22 ENCOUNTER — Ambulatory Visit
Admission: RE | Admit: 2013-09-22 | Discharge: 2013-09-22 | Disposition: A | Discharge status: Home / Self Care | Payer: Medicare Other | Source: Ambulatory Visit | Attending: Radiation Oncology | Admitting: Radiation Oncology

## 2013-09-22 DIAGNOSIS — R7301 Impaired fasting glucose: Secondary | ICD-10-CM | POA: Diagnosis not present

## 2013-09-22 DIAGNOSIS — Z6828 Body mass index (BMI) 28.0-28.9, adult: Secondary | ICD-10-CM | POA: Diagnosis not present

## 2013-09-22 DIAGNOSIS — L589 Radiodermatitis, unspecified: Secondary | ICD-10-CM | POA: Diagnosis not present

## 2013-09-22 DIAGNOSIS — Z510 Encounter for antineoplastic radiation therapy: Secondary | ICD-10-CM | POA: Diagnosis not present

## 2013-09-22 DIAGNOSIS — R059 Cough, unspecified: Secondary | ICD-10-CM | POA: Diagnosis not present

## 2013-09-22 DIAGNOSIS — C342 Malignant neoplasm of middle lobe, bronchus or lung: Secondary | ICD-10-CM | POA: Diagnosis not present

## 2013-09-22 DIAGNOSIS — K209 Esophagitis, unspecified without bleeding: Secondary | ICD-10-CM | POA: Diagnosis not present

## 2013-09-22 DIAGNOSIS — J040 Acute laryngitis: Secondary | ICD-10-CM | POA: Diagnosis not present

## 2013-09-22 DIAGNOSIS — M1990 Unspecified osteoarthritis, unspecified site: Secondary | ICD-10-CM | POA: Diagnosis not present

## 2013-09-22 DIAGNOSIS — R05 Cough: Secondary | ICD-10-CM | POA: Diagnosis not present

## 2013-09-22 DIAGNOSIS — C3490 Malignant neoplasm of unspecified part of unspecified bronchus or lung: Secondary | ICD-10-CM | POA: Diagnosis not present

## 2013-09-22 DIAGNOSIS — R350 Frequency of micturition: Secondary | ICD-10-CM | POA: Diagnosis not present

## 2013-09-22 DIAGNOSIS — R82998 Other abnormal findings in urine: Secondary | ICD-10-CM | POA: Diagnosis not present

## 2013-09-22 DIAGNOSIS — E785 Hyperlipidemia, unspecified: Secondary | ICD-10-CM | POA: Diagnosis not present

## 2013-09-22 NOTE — Progress Notes (Signed)
Patient is a 80 year old female diagnosed with small cell lung cancer.  She is a patient of Dr. Earlie Server and Dr. Pablo Ledger.  Past medical history includes hyperlipidemia, COPD, heart murmur, and arthritis.  Medications include Compazine, Zocor, and Carafate.  Labs include BUN of 28.4 on December 31.  Height: 66 inches. Weight: 181.1 pounds on January 6. Usual body weight: 190 pounds. BMI: 29.24.  Patient reports some painful swallowing however, is improved with Carafate.  She states she has a good appetite.  She complains of constipation and fatigue.  Patient is consuming small amounts of food throughout the day.  States she doesn't feel as if she eats enough.  Weight is down approximately 9 pounds from usual body weight. Patient is overweight with BMI of 29.24.  Nutrition diagnosis: Unintended weight loss related to diagnosis of small cell lung cancer and associated treatments as evidenced by a 9 pound weight loss from usual body weight.  Intervention: Patient was educated to consume small amounts of higher calorie, higher protein foods throughout the day in 6 small meals/snacks.  I reviewed high protein foods with patient.  I have educated her on strategies for increased intake utilizing colder foods.  I've encouraged increased fluids to help with constipation.  Patient was educated to comply with bowel regimen to improve constipation.  Fact sheets were provided.  Samples of oral nutrition supplements were given.  Contact information was provided.  All questions were answered and teach back method was used.  Monitoring, evaluation, goals: Patient will tolerate adequate calories and protein to promote weight maintenance.  Next visit: Patient has my contact information and will call me with questions or concerns.  I will visit in chemotherapy as needed.

## 2013-09-23 ENCOUNTER — Encounter: Payer: Self-pay | Admitting: Physician Assistant

## 2013-09-23 ENCOUNTER — Ambulatory Visit (HOSPITAL_BASED_OUTPATIENT_CLINIC_OR_DEPARTMENT_OTHER): Payer: Medicare Other | Admitting: Physician Assistant

## 2013-09-23 ENCOUNTER — Ambulatory Visit
Admission: RE | Admit: 2013-09-23 | Discharge: 2013-09-23 | Disposition: A | Discharge status: Home / Self Care | Payer: Medicare Other | Source: Ambulatory Visit | Attending: Radiation Oncology | Admitting: Radiation Oncology

## 2013-09-23 ENCOUNTER — Ambulatory Visit: Payer: Medicare Other | Admitting: Radiation Oncology

## 2013-09-23 ENCOUNTER — Telehealth: Payer: Self-pay | Admitting: Internal Medicine

## 2013-09-23 ENCOUNTER — Ambulatory Visit (HOSPITAL_BASED_OUTPATIENT_CLINIC_OR_DEPARTMENT_OTHER): Payer: Medicare Other

## 2013-09-23 VITALS — BP 101/36 | HR 93 | Temp 98.5°F | Resp 20 | Wt 182.2 lb

## 2013-09-23 DIAGNOSIS — J040 Acute laryngitis: Secondary | ICD-10-CM | POA: Diagnosis not present

## 2013-09-23 DIAGNOSIS — C342 Malignant neoplasm of middle lobe, bronchus or lung: Secondary | ICD-10-CM | POA: Diagnosis not present

## 2013-09-23 DIAGNOSIS — R5381 Other malaise: Secondary | ICD-10-CM | POA: Diagnosis not present

## 2013-09-23 DIAGNOSIS — C3490 Malignant neoplasm of unspecified part of unspecified bronchus or lung: Secondary | ICD-10-CM

## 2013-09-23 DIAGNOSIS — R05 Cough: Secondary | ICD-10-CM | POA: Diagnosis not present

## 2013-09-23 DIAGNOSIS — K209 Esophagitis, unspecified without bleeding: Secondary | ICD-10-CM | POA: Diagnosis not present

## 2013-09-23 DIAGNOSIS — J449 Chronic obstructive pulmonary disease, unspecified: Secondary | ICD-10-CM

## 2013-09-23 DIAGNOSIS — L589 Radiodermatitis, unspecified: Secondary | ICD-10-CM | POA: Diagnosis not present

## 2013-09-23 DIAGNOSIS — Z510 Encounter for antineoplastic radiation therapy: Secondary | ICD-10-CM | POA: Diagnosis not present

## 2013-09-23 DIAGNOSIS — R5383 Other fatigue: Secondary | ICD-10-CM | POA: Diagnosis not present

## 2013-09-23 DIAGNOSIS — C3491 Malignant neoplasm of unspecified part of right bronchus or lung: Secondary | ICD-10-CM

## 2013-09-23 DIAGNOSIS — R059 Cough, unspecified: Secondary | ICD-10-CM | POA: Diagnosis not present

## 2013-09-23 LAB — CBC WITH DIFFERENTIAL/PLATELET
BASO%: 0.5 % (ref 0.0–2.0)
Basophils Absolute: 0 10*3/uL (ref 0.0–0.1)
EOS ABS: 0 10*3/uL (ref 0.0–0.5)
EOS%: 0.2 % (ref 0.0–7.0)
HCT: 26.1 % — ABNORMAL LOW (ref 34.8–46.6)
HGB: 9 g/dL — ABNORMAL LOW (ref 11.6–15.9)
LYMPH%: 19.7 % (ref 14.0–49.7)
MCH: 32.6 pg (ref 25.1–34.0)
MCHC: 34.6 g/dL (ref 31.5–36.0)
MCV: 94.2 fL (ref 79.5–101.0)
MONO#: 1.1 10*3/uL — AB (ref 0.1–0.9)
MONO%: 17.3 % — AB (ref 0.0–14.0)
NEUT%: 62.3 % (ref 38.4–76.8)
NEUTROS ABS: 3.9 10*3/uL (ref 1.5–6.5)
Platelets: 143 10*3/uL — ABNORMAL LOW (ref 145–400)
RBC: 2.77 10*6/uL — AB (ref 3.70–5.45)
RDW: 15.6 % — AB (ref 11.2–14.5)
WBC: 6.3 10*3/uL (ref 3.9–10.3)
lymph#: 1.2 10*3/uL (ref 0.9–3.3)

## 2013-09-23 LAB — COMPREHENSIVE METABOLIC PANEL (CC13)
ALK PHOS: 60 U/L (ref 40–150)
ALT: 17 U/L (ref 0–55)
AST: 26 U/L (ref 5–34)
Albumin: 3.5 g/dL (ref 3.5–5.0)
Anion Gap: 11 mEq/L (ref 3–11)
BUN: 23.6 mg/dL (ref 7.0–26.0)
CO2: 27 meq/L (ref 22–29)
Calcium: 9.3 mg/dL (ref 8.4–10.4)
Chloride: 104 mEq/L (ref 98–109)
Creatinine: 0.8 mg/dL (ref 0.6–1.1)
Glucose: 109 mg/dl (ref 70–140)
POTASSIUM: 4.1 meq/L (ref 3.5–5.1)
Sodium: 141 mEq/L (ref 136–145)
TOTAL PROTEIN: 7.2 g/dL (ref 6.4–8.3)
Total Bilirubin: 0.51 mg/dL (ref 0.20–1.20)

## 2013-09-23 MED ORDER — HYDROCODONE-ACETAMINOPHEN 7.5-325 MG/15ML PO SOLN: 10.0000 mL | Freq: Four times a day (QID) | ORAL | Status: DC | PRN

## 2013-09-23 NOTE — Patient Instructions (Signed)
Continue weekly labs as scheduled Follow up in 3 weeks 

## 2013-09-23 NOTE — Telephone Encounter (Signed)
gv and printed appt sched and avs for pt for Jan and Feb...sed added tx.

## 2013-09-23 NOTE — Progress Notes (Signed)
[facility]     Rexene Edison, M.D. [place], [place] [address]               Blair Promise, M.D., Ph.D. Phone: [phone]      [place] A. Tammi Klippel, M.D. Fax: [phone]      Jodelle Gross, M.D., Ph.D.         Thea Silversmith, M.D.         Wyvonnia Lora, M.D Weekly Treatment Management Note  Name: Elizabeth Humphrey     MRN: [id number]        CSN: [id number] Date: 09/23/2013      DOB: [birth year]  CC: Geoffery Lyons, MD         Reynaldo Minium    Status: Outpatient  Diagnosis: The encounter diagnosis was Small cell lung carcinoma.  Current Dose: 34.2 Gy  Current Fraction: 19  Planned Dose: 59.4 Gy  Narrative: Donique D Show was seen today for weekly treatment management. The chart was checked and MVCT  were reviewed. She is tolerating her treatments reasonably well. She continues to take Carafate which helps her esophageal symptoms. She has run out of her hycet and I refilled this medication today.  She does have some fatigue. She denies any breathing problems.  Neosporin Current Outpatient Prescriptions  Medication Sig Dispense Refill  . aspirin 81 MG tablet Take 81 mg by mouth daily.      [name] emollient (BIAFINE) cream Apply 1 application topically 2 (two) times daily. Apply to affected skin area once skin becomes irriatated or red,itching, after rad txs then daily and bedtime      . HYDROcodone-acetaminophen (HYCET) 7.5-325 mg/15 ml solution Take 10 mLs by mouth 4 (four) times daily as needed for moderate pain.  120 mL  0  . lidocaine-prilocaine (EMLA) cream Apply to Port-A-Cath one to 2 hours prior to chemotherapy as directed  30 g  1  . mupirocin ointment (BACTROBAN) 2 % Apply 1 application topically as needed. For cuts and scrapes      . naproxen sodium (ANAPROX) 220 MG tablet Take 440 mg by mouth daily.       . prochlorperazine (COMPAZINE) 10 MG tablet Take 1 tablet (10 mg total) by mouth every 6 (six) hours as needed  for nausea or vomiting.  60 tablet  0  . simvastatin (ZOCOR) 40 MG tablet Take 40 mg by mouth at bedtime.      . sucralfate (CARAFATE) 1 G tablet Take 1 tablet (1 g total) by mouth 4 (four) times daily -  with meals and at bedtime.  120 tablet  1  . traMADol (ULTRAM) 50 MG tablet Take 1 tablet by mouth daily.        No current facility-administered medications for this encounter.   Labs:  Lab Results  Component Value Date   WBC 3.2* 09/17/2013   HGB 9.5* 09/17/2013   HCT 27.4* 09/17/2013   MCV 92.5 09/17/2013   PLT 25* 09/17/2013   Lab Results  Component Value Date   CREATININE 0.8 09/17/2013   BUN 20.7 09/17/2013   NA 137 09/17/2013   K 3.7 09/17/2013   CL 105 07/31/2013   CO2 26 09/17/2013   Lab Results  Component Value Date   ALT 18 09/17/2013   AST 22 09/17/2013   BILITOT 0.55 09/17/2013    Physical Examination:  Filed Vitals:   09/23/13 0925  BP: 101/36  Pulse: 93  Temp:  98.5 F (36.9 C)  Resp: 20    Wt Readings from Last 3 Encounters:  09/23/13 182 lb 3.2 oz (82.645 kg)  09/16/13 181 lb 1.6 oz (82.146 kg)  09/10/13 179 lb 3.2 oz (81.285 kg)     Lungs - Normal respiratory effort, chest expands symmetrically. Lungs are clear to auscultation, no crackles or wheezes.  Heart has regular rhythm and rate  Abdomen is soft and non tender with normal bowel sounds  Assessment:  Patient tolerating treatments well  Plan: Continue treatment per original radiation prescription

## 2013-09-23 NOTE — Progress Notes (Addendum)
No images are attached to the encounter. No scans are attached to the encounter. No scans are attached to the encounter. [facility] SHARED VISIT PROGRESS NOTE  Geoffery Lyons, MD [place], [place]. [place] [address]  DIAGNOSIS: Small cell lung carcinoma, limited stage   Primary site: Lung (Right)   Staging method: AJCC 7th Edition   Clinical: Stage IIIA (T2a, N2, M0) signed by Curt Bears, MD on 08/06/2013  3:32 PM   Summary: Stage IIIA (T2a, N2, M0)  PRIOR THERAPY: None  CURRENT THERAPY: Systemic chemotherapy with carboplatin for an AUC of 5 given on day 1 and etoposide 120 mg per meter squared on days 1, 2 and 3 with Neulasta support given on day 4. This given concurrent with radiation. Status post 2 cycles  DISEASE STAGE: Small cell lung carcinoma, limited stage   Primary site: Lung (Right)   Staging method: AJCC 7th Edition   Clinical: Stage IIIA (T2a, N2, M0) signed by Curt Bears, MD on 08/06/2013  3:32 PM   Summary: Stage IIIA (T2a, N2, M0)  CHEMOTHERAPY INTENT: Palliative  CURRENT # OF CHEMOTHERAPY CYCLES: 3  CURRENT ANTIEMETICS: Zofran, dexamethasone and Compazine  CURRENT SMOKING STATUS: Former smoker, quit 09/12/1991  ORAL CHEMOTHERAPY AND CONSENT: N/A  CURRENT BISPHOSPHONATES USE: None  PAIN MANAGEMENT: Hydrocodone and acetaminophen, tramadol  NARCOTICS INDUCED CONSTIPATION: Mild  LIVING WILL AND CODE STATUS: ?   INTERVAL HISTORY: Elizabeth Humphrey 80 y.o. female returns for a scheduled regular office visit for followup of recently diagnosed limited stage small cell lung cancer. She status post 2 cycle of systemic chemotherapy with carboplatin and etoposide with Neulasta support. She is also currently undergoing palliative radiotherapy under the care Dr. Sondra Come. Overall she is tolerating her chemotherapy relatively well with the exception of some mild episodes of constipation and fatigue.  Recently  she's been having some issues with urinary incontinence. She has been on antibiotics and also recently saw her primary care physician, Dr. Reynaldo Minium for this issue. She had a restaging CT scan of her chest to reevaluate her disease on 09/19/2013 and is here to discuss the results of that study. She is due for cycle #3 of her systemic chemotherapy with carboplatin and etoposide with Neulasta support on 09/24/2013. She voiced no other specific complaints.   MEDICAL HISTORY: Past Medical History  Diagnosis Date  . Allergy     bee stings/anaphylaxis  . Hyperlipidemia     hx of  . Lung cancer   . COPD (chronic obstructive pulmonary disease) 07/30/13  . Heart murmur   . History of pneumonia   . Arthritis     ALLERGIES:  is allergic to neosporin.  MEDICATIONS:  Current Outpatient Prescriptions  Medication Sig Dispense Refill  . aspirin 81 MG tablet Take 81 mg by mouth daily.      [name] emollient (BIAFINE) cream Apply 1 application topically 2 (two) times daily. Apply to affected skin area once skin becomes irriatated or red,itching, after rad txs then daily and bedtime      . HYDROcodone-acetaminophen (HYCET) 7.5-325 mg/15 ml solution Take 10 mLs by mouth 4 (four) times daily as needed for moderate pain.  120 mL  0  . lidocaine-prilocaine (EMLA) cream Apply to Port-A-Cath one to 2 hours prior to chemotherapy as directed  30 g  1  . mupirocin ointment (BACTROBAN) 2 % Apply 1 application topically as needed. For cuts and scrapes      . naproxen sodium (ANAPROX) 220 MG  tablet Take 440 mg by mouth daily.       . prochlorperazine (COMPAZINE) 10 MG tablet Take 1 tablet (10 mg total) by mouth every 6 (six) hours as needed for nausea or vomiting.  60 tablet  0  . simvastatin (ZOCOR) 40 MG tablet Take 40 mg by mouth at bedtime.      . sucralfate (CARAFATE) 1 G tablet Take 1 tablet (1 g total) by mouth 4 (four) times daily -  with meals and at bedtime.  120 tablet  1  . traMADol (ULTRAM) 50 MG tablet Take 1  tablet by mouth daily.        No current facility-administered medications for this visit.    SURGICAL HISTORY:  Past Surgical History  Procedure Laterality Date  . Dilation and curettage of uterus  yrs ago  . Tonsillectomy    . Cataract extraction      both eyes  . Video bronchoscopy with endobronchial ultrasound N/A 08/04/2013    Procedure: VIDEO BRONCHOSCOPY WITH ENDOBRONCHIAL ULTRASOUND;  Surgeon: Melrose Nakayama, MD;  Location: [facility];  Service: Thoracic;  Laterality: N/A;    REVIEW OF SYSTEMS:  Constitutional: positive for fatigue Eyes: negative Ears, nose, mouth, throat, and face: negative Respiratory: negative Cardiovascular: negative Gastrointestinal: positive for constipation Genitourinary:positive for urinary incontinence Integument/breast: negative Hematologic/lymphatic: negative Musculoskeletal:negative Neurological: negative Behavioral/Psych: negative Endocrine: negative Allergic/Immunologic: negative   PHYSICAL EXAMINATION: General appearance: alert, cooperative, appears stated age and no distress Head: Normocephalic, without obvious abnormality, atraumatic Neck: no adenopathy, no carotid bruit, no JVD, supple, symmetrical, trachea midline and thyroid not enlarged, symmetric, no tenderness/mass/nodules Lymph nodes: Cervical, supraclavicular, and axillary nodes normal. Resp: clear to auscultation bilaterally Back: symmetric, no curvature. ROM normal. No CVA tenderness. Cardio: regular rate and rhythm, S1, S2 normal, no murmur, click, rub or gallop GI: soft, non-tender; bowel sounds normal; no masses,  no organomegaly Extremities: extremities normal, atraumatic, no cyanosis or edema Neurologic: Alert and oriented X 3, normal strength and tone. Normal symmetric reflexes. Normal coordination and gait  ECOG PERFORMANCE STATUS: 1 - Symptomatic but completely ambulatory  There were no vitals taken for this visit.  LABORATORY DATA: Lab Results  Component  Value Date   WBC 6.3 09/23/2013   HGB 9.0* 09/23/2013   HCT 26.1* 09/23/2013   MCV 94.2 09/23/2013   PLT 143* 09/23/2013      Chemistry      Component Value Date/Time   NA 141 09/23/2013 1116   NA 142 07/31/2013 1300   K 4.1 09/23/2013 1116   K 4.1 07/31/2013 1300   CL 105 07/31/2013 1300   CO2 27 09/23/2013 1116   CO2 30 07/31/2013 1300   BUN 23.6 09/23/2013 1116   BUN 23 07/31/2013 1300   CREATININE 0.8 09/23/2013 1116   CREATININE 0.76 07/31/2013 1300      Component Value Date/Time   CALCIUM 9.3 09/23/2013 1116   CALCIUM 9.4 07/31/2013 1300   ALKPHOS 60 09/23/2013 1116   ALKPHOS 60 07/31/2013 1300   AST 26 09/23/2013 1116   AST 21 07/31/2013 1300   ALT 17 09/23/2013 1116   ALT 20 07/31/2013 1300   BILITOT 0.51 09/23/2013 1116   BILITOT 0.4 07/31/2013 1300       RADIOGRAPHIC STUDIES:  Ct Chest W Contrast  09/19/2013   CLINICAL DATA:  Follow-up lung cancer, diagnosed 07/2013, chemotherapy/XRT ongoing.  EXAM: CT CHEST WITH CONTRAST  TECHNIQUE: Multidetector CT imaging of the chest was performed during intravenous contrast administration.  CONTRAST:  66m OMNIPAQUE IOHEXOL 300 MG/ML  SOLN  COMPARISON:  PET-CT dated 07/24/2013.  FINDINGS: 1.8 x 1.4 cm right middle lobe nodule (series 5/image 33), corresponding to known primary bronchogenic neoplasm, previously 2.8 x 3.8 cm. Associated surrounding radiation changes.  5 mm medial left upper lobe nodule, hypermetabolic on PET, unchanged.  Biapical pleural parenchymal scarring, right greater than left. Additional mild nodularity/scarring in the posterior right upper lobe (series 5/ images 12, 13, and 16), unchanged. Additional mild nodularity in the lingula and left lower lobe (series 5/image 31, 40, and 42), unchanged. No pleural effusion or pneumothorax.  Visualized thyroid is unremarkable.  The heart is normal in size. No pericardial effusion. Coronary atherosclerosis. Atherosclerotic calcifications of the aortic arch.  Right chest port  terminating at the cavoatrial junction.  Improving thoracic lymphadenopathy, including:  --8 mm short axis right hilar node (series 2/ image 24), previously 16 mm  --14 mm short axis subcarinal node (series 2/ image 26), previously 26 mm  --8 mm short axis infrahilar/right lower lobe node (series 2/ image 29), previously 18 mm  Visualized upper abdomen is grossly unremarkable, noting a 10 mm hypoenhancing lesion in the lateral spleen (series 2/ image 54), likely benign.  Degenerative changes of the visualized thoracolumbar spine.  IMPRESSION: 1.8 cm right middle lobe nodule, corresponding to known primary bronchogenic neoplasm, decreased. Surrounding radiation changes.  5 mm medial left upper lobe nodule, hypermetabolic on PET, unchanged.  Improving thoracic lymphadenopathy, as described above.   Electronically Signed   By: SJulian HyM.D.   On: 09/19/2013 14:06   Ir Fluoro Guide Cv Line Right  08/28/2013   INDICATION: History of small cell lung cancer, in need of intravenous access for chemotherapy administration  EXAM: IMPLANTED PORT A CATH PLACEMENT WITH ULTRASOUND AND FLUOROSCOPIC GUIDANCE  MEDICATIONS: Ancef 2 gm IV; IV antibiotic was given in an appropriate time interval prior to skin puncture.  ANESTHESIA/SEDATION: Versed 2 mg IV; Fentanyl 100 mcg IV;  Total Moderate Sedation Time  30  minutes.  CONTRAST:  None  COMPARISON:  None.  FLUOROSCOPY TIME:  30 seconds  PROCEDURE: The procedure, risks, benefits, and alternatives were explained to the patient. Questions regarding the procedure were encouraged and answered. The patient understands and consents to the procedure.  The right neck and chest were prepped with chlorhexidine in a sterile fashion, and a sterile drape was applied covering the operative field. Maximum barrier sterile technique with sterile gowns and gloves were used for the procedure. A timeout was performed prior to the initiation of the procedure. Local anesthesia was provided with  1% lidocaine with epinephrine.  After creating a small venotomy incision, a micropuncture kit was utilized to access the internal jugular vein under direct, real-time ultrasound guidance. Ultrasound image documentation was performed. The microwire was kinked to measure appropriate catheter length.  A subcutaneous port pocket was then created along the upper chest wall utilizing a combination of sharp and blunt dissection. The pocket was irrigated with sterile saline. A single lumen ISP power injectable port was chosen for placement. The 8 Fr catheter was tunneled from the port pocket site to the venotomy incision. The port was placed in the pocket. The external catheter was trimmed to appropriate length. At the venotomy, an 8 Fr peel-away sheath was placed over a guidewire under fluoroscopic guidance. The catheter was then placed through the sheath and the sheath was removed. Final catheter positioning was confirmed and documented with a fluoroscopic spot radiograph. The port was accessed with a  Huber needle, aspirated and flushed with heparinized saline.  The venotomy site was closed with an interrupted 4-0 Vicryl suture. The port pocket incision was closed with interrupted 2-0 Vicryl suture and the skin was opposed with a running subcuticular 4-0 Vicryl suture. Dermabond and Steri-strips were applied to both incisions. Dressings were placed. The patient tolerated the procedure well without immediate post procedural complication.  COMPLICATIONS: None immediate  FINDINGS: After catheter placement, the tip lies within the superior cavoatrial junction. The catheter aspirates and flushes normally and is ready for immediate use.  IMPRESSION: Successful placement of a right internal jugular approach power injectable Port-A-Cath. The catheter is ready for immediate use.   Electronically Signed   By: Sandi Mariscal M.D.   On: 08/28/2013 10:28   Ir US Guide Vasc Access Right  08/28/2013   INDICATION: History of small cell  lung cancer, in need of intravenous access for chemotherapy administration  EXAM: IMPLANTED PORT A CATH PLACEMENT WITH ULTRASOUND AND FLUOROSCOPIC GUIDANCE  MEDICATIONS: Ancef 2 gm IV; IV antibiotic was given in an appropriate time interval prior to skin puncture.  ANESTHESIA/SEDATION: Versed 2 mg IV; Fentanyl 100 mcg IV;  Total Moderate Sedation Time  30  minutes.  CONTRAST:  None  COMPARISON:  None.  FLUOROSCOPY TIME:  30 seconds  PROCEDURE: The procedure, risks, benefits, and alternatives were explained to the patient. Questions regarding the procedure were encouraged and answered. The patient understands and consents to the procedure.  The right neck and chest were prepped with chlorhexidine in a sterile fashion, and a sterile drape was applied covering the operative field. Maximum barrier sterile technique with sterile gowns and gloves were used for the procedure. A timeout was performed prior to the initiation of the procedure. Local anesthesia was provided with 1% lidocaine with epinephrine.  After creating a small venotomy incision, a micropuncture kit was utilized to access the internal jugular vein under direct, real-time ultrasound guidance. Ultrasound image documentation was performed. The microwire was kinked to measure appropriate catheter length.  A subcutaneous port pocket was then created along the upper chest wall utilizing a combination of sharp and blunt dissection. The pocket was irrigated with sterile saline. A single lumen ISP power injectable port was chosen for placement. The 8 Fr catheter was tunneled from the port pocket site to the venotomy incision. The port was placed in the pocket. The external catheter was trimmed to appropriate length. At the venotomy, an 8 Fr peel-away sheath was placed over a guidewire under fluoroscopic guidance. The catheter was then placed through the sheath and the sheath was removed. Final catheter positioning was confirmed and documented with a fluoroscopic  spot radiograph. The port was accessed with a Huber needle, aspirated and flushed with heparinized saline.  The venotomy site was closed with an interrupted 4-0 Vicryl suture. The port pocket incision was closed with interrupted 2-0 Vicryl suture and the skin was opposed with a running subcuticular 4-0 Vicryl suture. Dermabond and Steri-strips were applied to both incisions. Dressings were placed. The patient tolerated the procedure well without immediate post procedural complication.  COMPLICATIONS: None immediate  FINDINGS: After catheter placement, the tip lies within the superior cavoatrial junction. The catheter aspirates and flushes normally and is ready for immediate use.  IMPRESSION: Successful placement of a right internal jugular approach power injectable Port-A-Cath. The catheter is ready for immediate use.   Electronically Signed   By: Sandi Mariscal M.D.   On: 08/28/2013 10:28      ASSESSMENT/PLAN: Patient  is a very pleasant 80 year old Caucasian female recently diagnosed with limited stage small cell lung cancer. She is status post 2 cycles of systemic chemotherapy with carboplatin and etoposide with Neulasta support. She is also undergoing radiation therapy under the care Dr. Sondra Come. Thus far she is tolerating both relatively well. Patient was discussed with and also seen by Dr. Julien Nordmann. Her recent CT scan of the chest revealed decrease in the 1.8 cm right middle lobe nodule. There were also surrounding radiation changes noted. The 5 mm medial left upper lobe nodule was unchanged. Also there is improved thoracic lymphadenopathy noted. These results were discussed with the patient of myself as well as Dr. Julien Nordmann. She will proceed with cycle #3 of her systemic chemotherapy with carboplatin for an AUC of 5 given on day 1 and etoposide 120 mg per meter squared given on days 1, 2 and 3 with Neulasta support given on day 4. She'll proceed with weekly labs as scheduled. She will followup with Dr. Julien Nordmann  in 3 weeks, prior to cycle #4.   Wynetta Emery, Kristian Mogg E, PA-C   All questions were answered. The patient knows to call the clinic with any problems, questions or concerns. We can certainly see the patient much sooner if necessary.  ADDENDUM: Hematology/Oncology Attending: I had the face to face encounter with the patient. I recommended her care plan. This is a very pleasant 79 years old white female was limited stage small cell lung cancer currently undergoing systemic chemotherapy with carboplatin and etoposide status post 2 cycles concurrent with radiation. The patient is tolerating her treatment fairly well with no significant complaints except for the alopecia and fatigue. Her recent scan showed partial response of her disease. I discussed the scan results with the patient and her husband. I recommended for her to continue her current systemic chemotherapy as scheduled. The patient will start cycle #3 tomorrow. She would come back for followup visit in 3 weeks before starting cycle #4.  She was advised to call immediately if she has any concerning symptoms in the interval.  Disclaimer: This note was dictated with voice recognition software. Similar sounding words can inadvertently be transcribed and may not be corrected upon review. Eilleen Kempf., MD 09/24/2013

## 2013-09-23 NOTE — Progress Notes (Signed)
Patient for weekly assessment of radiation to right lung.Completed 19 of 33 treatments.denies pain, needs refill on hycet.seen by PCP on yesterday for stomach problems.Swallowing better with use of carafate.Increased fatigue but able to perform usual activities.Completed 2 cycles of chemotherapy.appointment with medical oncology today.

## 2013-09-24 ENCOUNTER — Ambulatory Visit
Admission: RE | Admit: 2013-09-24 | Discharge: 2013-09-24 | Disposition: A | Discharge status: Home / Self Care | Payer: Medicare Other | Source: Ambulatory Visit | Attending: Radiation Oncology | Admitting: Radiation Oncology

## 2013-09-24 ENCOUNTER — Other Ambulatory Visit: Payer: Medicare Other

## 2013-09-24 ENCOUNTER — Ambulatory Visit (HOSPITAL_BASED_OUTPATIENT_CLINIC_OR_DEPARTMENT_OTHER): Payer: Medicare Other

## 2013-09-24 ENCOUNTER — Other Ambulatory Visit: Payer: Self-pay | Admitting: Internal Medicine

## 2013-09-24 VITALS — BP 122/48 | HR 91 | Temp 97.7°F | Resp 20

## 2013-09-24 DIAGNOSIS — C342 Malignant neoplasm of middle lobe, bronchus or lung: Secondary | ICD-10-CM | POA: Diagnosis not present

## 2013-09-24 DIAGNOSIS — J040 Acute laryngitis: Secondary | ICD-10-CM | POA: Diagnosis not present

## 2013-09-24 DIAGNOSIS — C3490 Malignant neoplasm of unspecified part of unspecified bronchus or lung: Secondary | ICD-10-CM

## 2013-09-24 DIAGNOSIS — R05 Cough: Secondary | ICD-10-CM | POA: Diagnosis not present

## 2013-09-24 DIAGNOSIS — K209 Esophagitis, unspecified without bleeding: Secondary | ICD-10-CM | POA: Diagnosis not present

## 2013-09-24 DIAGNOSIS — R059 Cough, unspecified: Secondary | ICD-10-CM | POA: Diagnosis not present

## 2013-09-24 DIAGNOSIS — Z5111 Encounter for antineoplastic chemotherapy: Secondary | ICD-10-CM | POA: Diagnosis not present

## 2013-09-24 DIAGNOSIS — L589 Radiodermatitis, unspecified: Secondary | ICD-10-CM | POA: Diagnosis not present

## 2013-09-24 DIAGNOSIS — Z510 Encounter for antineoplastic radiation therapy: Secondary | ICD-10-CM | POA: Diagnosis not present

## 2013-09-24 MED ORDER — DEXAMETHASONE SODIUM PHOSPHATE 20 MG/5ML IJ SOLN
20.0000 mg | Freq: Once | INTRAMUSCULAR | Status: AC
  Administered 2013-09-24: 20 mg via INTRAVENOUS

## 2013-09-24 MED ORDER — DEXAMETHASONE SODIUM PHOSPHATE 20 MG/5ML IJ SOLN
INTRAMUSCULAR | Status: AC
  Filled 2013-09-24: qty 5

## 2013-09-24 MED ORDER — SODIUM CHLORIDE 0.9 % IJ SOLN
10.0000 mL | INTRAMUSCULAR | Status: DC | PRN
  Administered 2013-09-24: 10 mL
  Filled 2013-09-24: qty 10

## 2013-09-24 MED ORDER — ONDANSETRON 16 MG/50ML IVPB (CHCC)
16.0000 mg | Freq: Once | INTRAVENOUS | Status: AC
  Administered 2013-09-24: 16 mg via INTRAVENOUS

## 2013-09-24 MED ORDER — SODIUM CHLORIDE 0.9 % IV SOLN
439.0000 mg | Freq: Once | INTRAVENOUS | Status: AC
  Administered 2013-09-24: 440 mg via INTRAVENOUS
  Filled 2013-09-24: qty 44

## 2013-09-24 MED ORDER — HEPARIN SOD (PORK) LOCK FLUSH 100 UNIT/ML IV SOLN
500.0000 [IU] | Freq: Once | INTRAVENOUS | Status: AC | PRN
  Administered 2013-09-24: 500 [IU]
  Filled 2013-09-24: qty 5

## 2013-09-24 MED ORDER — SODIUM CHLORIDE 0.9 % IV SOLN
Freq: Once | INTRAVENOUS | Status: AC
  Administered 2013-09-24: 10:00:00 via INTRAVENOUS

## 2013-09-24 MED ORDER — ONDANSETRON 16 MG/50ML IVPB (CHCC)
INTRAVENOUS | Status: AC
  Filled 2013-09-24: qty 16

## 2013-09-24 MED ORDER — SODIUM CHLORIDE 0.9 % IV SOLN
120.0000 mg/m2 | Freq: Once | INTRAVENOUS | Status: AC
  Administered 2013-09-24: 240 mg via INTRAVENOUS
  Filled 2013-09-24: qty 12

## 2013-09-24 NOTE — Patient Instructions (Signed)
[facility] Discharge Instructions for Patients Receiving Chemotherapy  Today you received the following chemotherapy agents: carboplatin, etoposide  To help prevent nausea and vomiting after your treatment, we encourage you to take your nausea medication.  Take it as often as prescribed.     If you develop nausea and vomiting that is not controlled by your nausea medication, call the clinic. If it is after clinic hours your family physician or the after hours number for the clinic or go to the Emergency Department.   BELOW ARE SYMPTOMS THAT SHOULD BE REPORTED IMMEDIATELY:  *FEVER GREATER THAN 100.5 F  *CHILLS WITH OR WITHOUT FEVER  NAUSEA AND VOMITING THAT IS NOT CONTROLLED WITH YOUR NAUSEA MEDICATION  *UNUSUAL SHORTNESS OF BREATH  *UNUSUAL BRUISING OR BLEEDING  TENDERNESS IN MOUTH AND THROAT WITH OR WITHOUT PRESENCE OF ULCERS  *URINARY PROBLEMS  *BOWEL PROBLEMS  UNUSUAL RASH Items with * indicate a potential emergency and should be followed up as soon as possible.  Feel free to call the clinic you have any questions or concerns. The clinic phone number is [phone].   I have been informed and understand all the instructions given to me. I know to contact the clinic, my physician, or go to the Emergency Department if any problems should occur. I do not have any questions at this time, but understand that I may call the clinic during office hours   should I have any questions or need assistance in obtaining follow up care.    __________________________________________  _____________  __________ Signature of Patient or Authorized Representative            Date                   Time    __________________________________________ Nurse's Signature

## 2013-09-25 ENCOUNTER — Ambulatory Visit (HOSPITAL_BASED_OUTPATIENT_CLINIC_OR_DEPARTMENT_OTHER): Payer: Medicare Other

## 2013-09-25 ENCOUNTER — Ambulatory Visit
Admission: RE | Admit: 2013-09-25 | Discharge: 2013-09-25 | Disposition: A | Discharge status: Home / Self Care | Payer: Medicare Other | Source: Ambulatory Visit | Attending: Radiation Oncology | Admitting: Radiation Oncology

## 2013-09-25 VITALS — BP 107/62 | HR 85 | Temp 97.4°F | Resp 16

## 2013-09-25 DIAGNOSIS — C342 Malignant neoplasm of middle lobe, bronchus or lung: Secondary | ICD-10-CM

## 2013-09-25 DIAGNOSIS — Z510 Encounter for antineoplastic radiation therapy: Secondary | ICD-10-CM | POA: Diagnosis not present

## 2013-09-25 DIAGNOSIS — L589 Radiodermatitis, unspecified: Secondary | ICD-10-CM | POA: Diagnosis not present

## 2013-09-25 DIAGNOSIS — K209 Esophagitis, unspecified without bleeding: Secondary | ICD-10-CM | POA: Diagnosis not present

## 2013-09-25 DIAGNOSIS — R05 Cough: Secondary | ICD-10-CM | POA: Diagnosis not present

## 2013-09-25 DIAGNOSIS — Z5111 Encounter for antineoplastic chemotherapy: Secondary | ICD-10-CM | POA: Diagnosis not present

## 2013-09-25 DIAGNOSIS — J040 Acute laryngitis: Secondary | ICD-10-CM | POA: Diagnosis not present

## 2013-09-25 DIAGNOSIS — C3490 Malignant neoplasm of unspecified part of unspecified bronchus or lung: Secondary | ICD-10-CM

## 2013-09-25 DIAGNOSIS — R059 Cough, unspecified: Secondary | ICD-10-CM | POA: Diagnosis not present

## 2013-09-25 MED ORDER — DEXAMETHASONE SODIUM PHOSPHATE 10 MG/ML IJ SOLN
10.0000 mg | Freq: Once | INTRAMUSCULAR | Status: AC
  Administered 2013-09-25: 10 mg via INTRAVENOUS

## 2013-09-25 MED ORDER — ONDANSETRON 8 MG/50ML IVPB (CHCC)
8.0000 mg | Freq: Once | INTRAVENOUS | Status: AC
  Administered 2013-09-25: 8 mg via INTRAVENOUS

## 2013-09-25 MED ORDER — SODIUM CHLORIDE 0.9 % IJ SOLN
10.0000 mL | INTRAMUSCULAR | Status: DC | PRN
  Administered 2013-09-25: 10 mL
  Filled 2013-09-25: qty 10

## 2013-09-25 MED ORDER — DEXAMETHASONE SODIUM PHOSPHATE 10 MG/ML IJ SOLN
INTRAMUSCULAR | Status: AC
  Filled 2013-09-25: qty 1

## 2013-09-25 MED ORDER — ETOPOSIDE CHEMO INJECTION 1 GM/50ML
120.0000 mg/m2 | Freq: Once | INTRAVENOUS | Status: AC
  Administered 2013-09-25: 240 mg via INTRAVENOUS
  Filled 2013-09-25: qty 12

## 2013-09-25 MED ORDER — SODIUM CHLORIDE 0.9 % IV SOLN
Freq: Once | INTRAVENOUS | Status: AC
  Administered 2013-09-25: 10:00:00 via INTRAVENOUS

## 2013-09-25 MED ORDER — HEPARIN SOD (PORK) LOCK FLUSH 100 UNIT/ML IV SOLN
500.0000 [IU] | Freq: Once | INTRAVENOUS | Status: AC | PRN
  Administered 2013-09-25: 500 [IU]
  Filled 2013-09-25: qty 5

## 2013-09-25 MED ORDER — ONDANSETRON 8 MG/NS 50 ML IVPB
INTRAVENOUS | Status: AC
  Filled 2013-09-25: qty 8

## 2013-09-25 NOTE — Patient Instructions (Signed)
[facility] Discharge Instructions for Patients Receiving Chemotherapy  Today you received the following chemotherapy agents etoposide.  To help prevent nausea and vomiting after your treatment, we encourage you to take your nausea medication compazine.   If you develop nausea and vomiting that is not controlled by your nausea medication, call the clinic.   BELOW ARE SYMPTOMS THAT SHOULD BE REPORTED IMMEDIATELY:  *FEVER GREATER THAN 100.5 F  *CHILLS WITH OR WITHOUT FEVER  NAUSEA AND VOMITING THAT IS NOT CONTROLLED WITH YOUR NAUSEA MEDICATION  *UNUSUAL SHORTNESS OF BREATH  *UNUSUAL BRUISING OR BLEEDING  TENDERNESS IN MOUTH AND THROAT WITH OR WITHOUT PRESENCE OF ULCERS  *URINARY PROBLEMS  *BOWEL PROBLEMS  UNUSUAL RASH Items with * indicate a potential emergency and should be followed up as soon as possible.  Feel free to call the clinic you have any questions or concerns. The clinic phone number is [phone].

## 2013-09-26 ENCOUNTER — Ambulatory Visit (HOSPITAL_BASED_OUTPATIENT_CLINIC_OR_DEPARTMENT_OTHER): Payer: Medicare Other

## 2013-09-26 ENCOUNTER — Ambulatory Visit
Admission: RE | Admit: 2013-09-26 | Discharge: 2013-09-26 | Disposition: A | Discharge status: Home / Self Care | Payer: Medicare Other | Source: Ambulatory Visit | Attending: Radiation Oncology | Admitting: Radiation Oncology

## 2013-09-26 VITALS — BP 118/50 | HR 72 | Temp 97.2°F | Resp 18

## 2013-09-26 DIAGNOSIS — C3490 Malignant neoplasm of unspecified part of unspecified bronchus or lung: Secondary | ICD-10-CM

## 2013-09-26 DIAGNOSIS — Z5111 Encounter for antineoplastic chemotherapy: Secondary | ICD-10-CM

## 2013-09-26 DIAGNOSIS — R05 Cough: Secondary | ICD-10-CM | POA: Diagnosis not present

## 2013-09-26 DIAGNOSIS — K209 Esophagitis, unspecified without bleeding: Secondary | ICD-10-CM | POA: Diagnosis not present

## 2013-09-26 DIAGNOSIS — Z510 Encounter for antineoplastic radiation therapy: Secondary | ICD-10-CM | POA: Diagnosis not present

## 2013-09-26 DIAGNOSIS — C342 Malignant neoplasm of middle lobe, bronchus or lung: Secondary | ICD-10-CM | POA: Diagnosis not present

## 2013-09-26 DIAGNOSIS — L589 Radiodermatitis, unspecified: Secondary | ICD-10-CM | POA: Diagnosis not present

## 2013-09-26 DIAGNOSIS — R059 Cough, unspecified: Secondary | ICD-10-CM | POA: Diagnosis not present

## 2013-09-26 DIAGNOSIS — J040 Acute laryngitis: Secondary | ICD-10-CM | POA: Diagnosis not present

## 2013-09-26 MED ORDER — SODIUM CHLORIDE 0.9 % IV SOLN
120.0000 mg/m2 | Freq: Once | INTRAVENOUS | Status: AC
  Administered 2013-09-26: 240 mg via INTRAVENOUS
  Filled 2013-09-26: qty 12

## 2013-09-26 MED ORDER — SODIUM CHLORIDE 0.9 % IV SOLN
Freq: Once | INTRAVENOUS | Status: AC
  Administered 2013-09-26: 10:00:00 via INTRAVENOUS

## 2013-09-26 MED ORDER — HEPARIN SOD (PORK) LOCK FLUSH 100 UNIT/ML IV SOLN
500.0000 [IU] | Freq: Once | INTRAVENOUS | Status: AC | PRN
  Administered 2013-09-26: 500 [IU]
  Filled 2013-09-26: qty 5

## 2013-09-26 MED ORDER — SODIUM CHLORIDE 0.9 % IJ SOLN
10.0000 mL | INTRAMUSCULAR | Status: DC | PRN
  Administered 2013-09-26: 10 mL
  Filled 2013-09-26: qty 10

## 2013-09-26 MED ORDER — ONDANSETRON 8 MG/NS 50 ML IVPB
INTRAVENOUS | Status: AC
  Filled 2013-09-26: qty 8

## 2013-09-26 MED ORDER — DEXAMETHASONE SODIUM PHOSPHATE 10 MG/ML IJ SOLN
10.0000 mg | Freq: Once | INTRAMUSCULAR | Status: AC
  Administered 2013-09-26: 10 mg via INTRAVENOUS

## 2013-09-26 MED ORDER — ONDANSETRON 8 MG/50ML IVPB (CHCC)
8.0000 mg | Freq: Once | INTRAVENOUS | Status: AC
  Administered 2013-09-26: 8 mg via INTRAVENOUS

## 2013-09-26 MED ORDER — DEXAMETHASONE SODIUM PHOSPHATE 10 MG/ML IJ SOLN
INTRAMUSCULAR | Status: AC
  Filled 2013-09-26: qty 1

## 2013-09-26 NOTE — Patient Instructions (Signed)
[facility] Discharge Instructions for Patients Receiving Chemotherapy  Today you received the following chemotherapy agents:  etoposide  To help prevent nausea and vomiting after your treatment, we encourage you to take your nausea medication.  Take it as often as prescribed.     If you develop nausea and vomiting that is not controlled by your nausea medication, call the clinic. If it is after clinic hours your family physician or the after hours number for the clinic or go to the Emergency Department.   BELOW ARE SYMPTOMS THAT SHOULD BE REPORTED IMMEDIATELY:  *FEVER GREATER THAN 100.5 F  *CHILLS WITH OR WITHOUT FEVER  NAUSEA AND VOMITING THAT IS NOT CONTROLLED WITH YOUR NAUSEA MEDICATION  *UNUSUAL SHORTNESS OF BREATH  *UNUSUAL BRUISING OR BLEEDING  TENDERNESS IN MOUTH AND THROAT WITH OR WITHOUT PRESENCE OF ULCERS  *URINARY PROBLEMS  *BOWEL PROBLEMS  UNUSUAL RASH Items with * indicate a potential emergency and should be followed up as soon as possible.  Feel free to call the clinic you have any questions or concerns. The clinic phone number is [phone].   I have been informed and understand all the instructions given to me. I know to contact the clinic, my physician, or go to the Emergency Department if any problems should occur. I do not have any questions at this time, but understand that I may call the clinic during office hours   should I have any questions or need assistance in obtaining follow up care.    __________________________________________  _____________  __________ Signature of Patient or Authorized Representative            Date                   Time    __________________________________________ Nurse's Signature

## 2013-09-27 ENCOUNTER — Ambulatory Visit (HOSPITAL_BASED_OUTPATIENT_CLINIC_OR_DEPARTMENT_OTHER): Payer: Medicare Other

## 2013-09-27 VITALS — BP 116/71 | HR 79 | Temp 96.2°F | Resp 19

## 2013-09-27 DIAGNOSIS — Z5189 Encounter for other specified aftercare: Secondary | ICD-10-CM | POA: Diagnosis not present

## 2013-09-27 DIAGNOSIS — C3490 Malignant neoplasm of unspecified part of unspecified bronchus or lung: Secondary | ICD-10-CM | POA: Diagnosis not present

## 2013-09-27 MED ORDER — PEGFILGRASTIM INJECTION 6 MG/0.6ML
6.0000 mg | Freq: Once | SUBCUTANEOUS | Status: AC
  Administered 2013-09-27: 6 mg via SUBCUTANEOUS

## 2013-09-27 NOTE — Patient Instructions (Signed)

## 2013-09-29 ENCOUNTER — Ambulatory Visit
Admission: RE | Admit: 2013-09-29 | Discharge: 2013-09-29 | Disposition: A | Discharge status: Home / Self Care | Payer: Medicare Other | Source: Ambulatory Visit | Attending: Radiation Oncology | Admitting: Radiation Oncology

## 2013-09-29 DIAGNOSIS — K209 Esophagitis, unspecified without bleeding: Secondary | ICD-10-CM | POA: Diagnosis not present

## 2013-09-29 DIAGNOSIS — Z510 Encounter for antineoplastic radiation therapy: Secondary | ICD-10-CM | POA: Diagnosis not present

## 2013-09-29 DIAGNOSIS — R05 Cough: Secondary | ICD-10-CM | POA: Diagnosis not present

## 2013-09-29 DIAGNOSIS — L589 Radiodermatitis, unspecified: Secondary | ICD-10-CM | POA: Diagnosis not present

## 2013-09-29 DIAGNOSIS — R059 Cough, unspecified: Secondary | ICD-10-CM | POA: Diagnosis not present

## 2013-09-29 DIAGNOSIS — C3490 Malignant neoplasm of unspecified part of unspecified bronchus or lung: Secondary | ICD-10-CM | POA: Diagnosis not present

## 2013-09-29 DIAGNOSIS — C342 Malignant neoplasm of middle lobe, bronchus or lung: Secondary | ICD-10-CM | POA: Diagnosis not present

## 2013-09-29 DIAGNOSIS — J040 Acute laryngitis: Secondary | ICD-10-CM | POA: Diagnosis not present

## 2013-09-30 ENCOUNTER — Encounter: Payer: Self-pay | Admitting: Radiation Oncology

## 2013-09-30 ENCOUNTER — Ambulatory Visit
Admission: RE | Admit: 2013-09-30 | Discharge: 2013-09-30 | Disposition: A | Discharge status: Home / Self Care | Payer: Medicare Other | Source: Ambulatory Visit | Attending: Radiation Oncology | Admitting: Radiation Oncology

## 2013-09-30 ENCOUNTER — Ambulatory Visit (HOSPITAL_BASED_OUTPATIENT_CLINIC_OR_DEPARTMENT_OTHER): Payer: Medicare Other

## 2013-09-30 VITALS — BP 114/66 | HR 95 | Temp 97.2°F | Resp 18

## 2013-09-30 VITALS — BP 84/56 | HR 116 | Temp 97.9°F | Resp 20 | Wt 174.5 lb

## 2013-09-30 DIAGNOSIS — Z79899 Other long term (current) drug therapy: Secondary | ICD-10-CM | POA: Diagnosis not present

## 2013-09-30 DIAGNOSIS — C3490 Malignant neoplasm of unspecified part of unspecified bronchus or lung: Secondary | ICD-10-CM | POA: Diagnosis not present

## 2013-09-30 DIAGNOSIS — C342 Malignant neoplasm of middle lobe, bronchus or lung: Secondary | ICD-10-CM

## 2013-09-30 DIAGNOSIS — R05 Cough: Secondary | ICD-10-CM | POA: Diagnosis not present

## 2013-09-30 DIAGNOSIS — J040 Acute laryngitis: Secondary | ICD-10-CM | POA: Diagnosis not present

## 2013-09-30 DIAGNOSIS — Z510 Encounter for antineoplastic radiation therapy: Secondary | ICD-10-CM | POA: Diagnosis not present

## 2013-09-30 DIAGNOSIS — R059 Cough, unspecified: Secondary | ICD-10-CM | POA: Diagnosis not present

## 2013-09-30 DIAGNOSIS — K209 Esophagitis, unspecified without bleeding: Secondary | ICD-10-CM | POA: Insufficient documentation

## 2013-09-30 DIAGNOSIS — L589 Radiodermatitis, unspecified: Secondary | ICD-10-CM | POA: Diagnosis not present

## 2013-09-30 LAB — BASIC METABOLIC PANEL (CC13)
Anion Gap: 12 mEq/L — ABNORMAL HIGH (ref 3–11)
BUN: 35.3 mg/dL — ABNORMAL HIGH (ref 7.0–26.0)
CHLORIDE: 102 meq/L (ref 98–109)
CO2: 24 mEq/L (ref 22–29)
CREATININE: 0.8 mg/dL (ref 0.6–1.1)
Calcium: 9.6 mg/dL (ref 8.4–10.4)
Glucose: 112 mg/dl (ref 70–140)
Potassium: 3.6 mEq/L (ref 3.5–5.1)
Sodium: 138 mEq/L (ref 136–145)

## 2013-09-30 LAB — CBC WITH DIFFERENTIAL/PLATELET
BASO%: 0.5 % (ref 0.0–2.0)
Basophils Absolute: 0 10*3/uL (ref 0.0–0.1)
EOS%: 0.1 % (ref 0.0–7.0)
Eosinophils Absolute: 0 10*3/uL (ref 0.0–0.5)
HEMATOCRIT: 28.5 % — AB (ref 34.8–46.6)
HGB: 9.7 g/dL — ABNORMAL LOW (ref 11.6–15.9)
LYMPH#: 0.6 10*3/uL — AB (ref 0.9–3.3)
LYMPH%: 9.3 % — ABNORMAL LOW (ref 14.0–49.7)
MCH: 32.5 pg (ref 25.1–34.0)
MCHC: 33.9 g/dL (ref 31.5–36.0)
MCV: 95.7 fL (ref 79.5–101.0)
MONO#: 0.1 10*3/uL (ref 0.1–0.9)
MONO%: 2.4 % (ref 0.0–14.0)
NEUT#: 5.2 10*3/uL (ref 1.5–6.5)
NEUT%: 87.7 % — ABNORMAL HIGH (ref 38.4–76.8)
Platelets: 166 10*3/uL (ref 145–400)
RBC: 2.98 10*6/uL — ABNORMAL LOW (ref 3.70–5.45)
RDW: 18 % — AB (ref 11.2–14.5)
WBC: 6 10*3/uL (ref 3.9–10.3)

## 2013-09-30 MED ORDER — HEPARIN SOD (PORK) LOCK FLUSH 100 UNIT/ML IV SOLN
500.0000 [IU] | Freq: Once | INTRAVENOUS | Status: AC
  Administered 2013-09-30: 500 [IU] via INTRAVENOUS
  Filled 2013-09-30: qty 5

## 2013-09-30 MED ORDER — KCL IN DEXTROSE-NACL 20-5-0.45 MEQ/L-%-% IV SOLN
INTRAVENOUS | Status: AC
  Administered 2013-09-30: 12:00:00 via INTRAVENOUS
  Filled 2013-09-30: qty 1000

## 2013-09-30 MED ORDER — SODIUM CHLORIDE 0.9 % IJ SOLN
10.0000 mL | INTRAMUSCULAR | Status: DC | PRN
  Administered 2013-09-30: 10 mL via INTRAVENOUS
  Filled 2013-09-30: qty 10

## 2013-09-30 NOTE — Progress Notes (Signed)
Pt c/o dizziness, nausea x 3 days, weakness, fatigue. She had chemo last Wed, Thurs and Fri, injection on Sat. She states she has no appetite, "nothing appeals to her", is drinking Boost but not daily. She denies sore throat, is taking Carafate. Pt has SOB w/exertion. Pt lost 8 lbs in past week, is orthostatic today.

## 2013-09-30 NOTE — Patient Instructions (Signed)
Hypokalemia Hypokalemia means that the amount of potassium in the blood is lower than normal.Potassium is a chemical, called an electrolyte, that helps regulate the amount of fluid in the body. It also stimulates muscle contraction and helps nerves function properly.Most of the body's potassium is inside of cells, and only a very small amount is in the blood. Because the amount in the blood is so small, minor changes can be life-threatening. CAUSES  Antibiotics.  Diarrhea or vomiting.  Using laxatives too much, which can cause diarrhea.  Chronic kidney disease.  Water pills (diuretics).  Eating disorders (bulimia).  Low magnesium level.  Sweating a lot. SIGNS AND SYMPTOMS  Weakness.  Constipation.  Fatigue.  Muscle cramps.  Mental confusion.  Skipped heartbeats or irregular heartbeat (palpitations).  Tingling or numbness. DIAGNOSIS  Your health care provider can diagnose hypokalemia with blood tests. In addition to checking your potassium level, your health care provider may also check other lab tests. TREATMENT Hypokalemia can be treated with potassium supplements taken by mouth or adjustments in your current medicines. If your potassium level is very low, you may need to get potassium through a vein (IV) and be monitored in the hospital. A diet high in potassium is also helpful. Foods high in potassium are:  Nuts, such as peanuts and pistachios.  Seeds, such as sunflower seeds and pumpkin seeds.  Peas, lentils, and lima beans.  Whole grain and bran cereals and breads.  Fresh fruit and vegetables, such as apricots, avocado, bananas, cantaloupe, kiwi, oranges, tomatoes, asparagus, and potatoes.  Orange and tomato juices.  Red meats.  Fruit yogurt. HOME CARE INSTRUCTIONS  Take all medicines as prescribed by your health care provider.  Maintain a healthy diet by including nutritious food, such as fruits, vegetables, nuts, whole grains, and lean meats.  If  you are taking a laxative, be sure to follow the directions on the label. SEEK MEDICAL CARE IF:  Your weakness gets worse.  You feel your heart pounding or racing.  You are vomiting or having diarrhea.  You are diabetic and having trouble keeping your blood glucose in the normal range. SEEK IMMEDIATE MEDICAL CARE IF:  You have chest pain, shortness of breath, or dizziness.  You are vomiting or having diarrhea for more than 2 days.  You faint. MAKE SURE YOU:   Understand these instructions.  Will watch your condition.  Will get help right away if you are not doing well or get worse. Document Released: 08/28/2005 Document Revised: 06/18/2013 Document Reviewed: 02/28/2013 [employer] Patient Information 2014 [employer].  

## 2013-09-30 NOTE — Progress Notes (Signed)
[facility]     Rexene Edison, M.D. [place], [place] [address]               Blair Promise, M.D., Ph.D. Phone: [phone]      [place] A. Tammi Klippel, M.D. Fax: [phone]      Jodelle Gross, M.D., Ph.D.         Thea Silversmith, M.D.         Wyvonnia Lora, M.D Weekly Treatment Management Note  Name: Elizabeth Humphrey     MRN: [id number]        CSN: [id number] Date: 09/30/2013      DOB: 1933-09-14  CC: Geoffery Lyons, MD         Reynaldo Minium    Status: Outpatient  Diagnosis: The encounter diagnosis was Small cell lung carcinoma.  Current Dose: 43.2 Gy  Current Fraction: 24  Planned Dose: 59.4 Gy  Narrative: Elizabeth Humphrey was seen today for weekly treatment management. The chart was checked and MVCT  were reviewed.  She is having more problems with esophagitis. She has cut back on her by mouth intake. She does have Carafate and hydrocodone. She did receive chemotherapy late last week has not been feeling well since then. She in addition is received what sounds like Neulasta over the weekend  Neosporin Current Outpatient Prescriptions  Medication Sig Dispense Refill  . aspirin 81 MG tablet Take 81 mg by mouth daily.      [name] emollient (BIAFINE) cream Apply 1 application topically 2 (two) times daily. Apply to affected skin area once skin becomes irriatated or red,itching, after rad txs then daily and bedtime      . HYDROcodone-acetaminophen (HYCET) 7.5-325 mg/15 ml solution Take 10 mLs by mouth 4 (four) times daily as needed for moderate pain.  120 mL  0  . lidocaine-prilocaine (EMLA) cream Apply to Port-A-Cath one to 2 hours prior to chemotherapy as directed  30 g  1  . mupirocin ointment (BACTROBAN) 2 % Apply 1 application topically as needed. For cuts and scrapes      . naproxen sodium (ANAPROX) 220 MG tablet Take 440 mg by mouth daily.       . polyethylene glycol (MIRALAX / GLYCOLAX) packet Take 17 g by mouth daily.       . prochlorperazine (COMPAZINE) 10 MG tablet Take 1 tablet (10 mg total) by mouth every 6 (six) hours as needed for nausea or vomiting.  60 tablet  0  . simvastatin (ZOCOR) 40 MG tablet Take 40 mg by mouth at bedtime.      . sucralfate (CARAFATE) 1 G tablet Take 1 tablet (1 g total) by mouth 4 (four) times daily -  with meals and at bedtime.  120 tablet  1  . traMADol (ULTRAM) 50 MG tablet Take 1 tablet by mouth daily.        No current facility-administered medications for this encounter.   Labs:  Lab Results  Component Value Date   WBC 6.3 09/23/2013   HGB 9.0* 09/23/2013   HCT 26.1* 09/23/2013   MCV 94.2 09/23/2013   PLT 143* 09/23/2013   Lab Results  Component Value Date   CREATININE 0.8 09/23/2013   BUN 23.6 09/23/2013   NA 141 09/23/2013   K 4.1 09/23/2013   CL 105 07/31/2013   CO2 27 09/23/2013   Lab Results  Component Value Date   ALT 17 09/23/2013   AST 26  09/23/2013   BILITOT 0.51 09/23/2013    Physical Examination:  Filed Vitals:   09/30/13 0936  BP: 84/56  Pulse: 116  Temp:   Resp:     Wt Readings from Last 3 Encounters:  09/30/13 174 lb 8 oz (79.153 kg)  09/23/13 182 lb 3.2 oz (82.645 kg)  09/16/13 181 lb 1.6 oz (82.146 kg)    The oral cavity is mildly dry without secondary infection.  The patient does have orthostatic blood pressure changes Lungs - Normal respiratory effort, chest expands symmetrically. Lungs are clear to auscultation, no crackles or wheezes.  Heart has regular rhythm and rate  Abdomen is soft and non tender with normal bowel sounds The skin of the chest and back area shows mild radiation dermatitis  Assessment:  Patient tolerating treatments well except for above issues  Plan: Continue treatment per original radiation prescription.  She will be set up for IV fluid supplementation later this morning.

## 2013-10-01 ENCOUNTER — Other Ambulatory Visit (HOSPITAL_BASED_OUTPATIENT_CLINIC_OR_DEPARTMENT_OTHER): Payer: Medicare Other

## 2013-10-01 ENCOUNTER — Ambulatory Visit
Admission: RE | Admit: 2013-10-01 | Discharge: 2013-10-01 | Disposition: A | Discharge status: Home / Self Care | Payer: Medicare Other | Source: Ambulatory Visit | Attending: Radiation Oncology | Admitting: Radiation Oncology

## 2013-10-01 DIAGNOSIS — C342 Malignant neoplasm of middle lobe, bronchus or lung: Secondary | ICD-10-CM

## 2013-10-01 DIAGNOSIS — C3491 Malignant neoplasm of unspecified part of right bronchus or lung: Secondary | ICD-10-CM

## 2013-10-01 LAB — CBC WITH DIFFERENTIAL/PLATELET
BASO%: 1.7 % (ref 0.0–2.0)
Basophils Absolute: 0 10*3/uL (ref 0.0–0.1)
EOS ABS: 0 10*3/uL (ref 0.0–0.5)
EOS%: 0 % (ref 0.0–7.0)
HEMATOCRIT: 26.8 % — AB (ref 34.8–46.6)
HGB: 9 g/dL — ABNORMAL LOW (ref 11.6–15.9)
LYMPH%: 17.6 % (ref 14.0–49.7)
MCH: 31.6 pg (ref 25.1–34.0)
MCHC: 33.6 g/dL (ref 31.5–36.0)
MCV: 94 fL (ref 79.5–101.0)
MONO#: 0.1 10*3/uL (ref 0.1–0.9)
MONO%: 10.9 % (ref 0.0–14.0)
NEUT#: 0.8 10*3/uL — ABNORMAL LOW (ref 1.5–6.5)
NEUT%: 69.8 % (ref 38.4–76.8)
PLATELETS: 110 10*3/uL — AB (ref 145–400)
RBC: 2.85 10*6/uL — AB (ref 3.70–5.45)
RDW: 17.7 % — ABNORMAL HIGH (ref 11.2–14.5)
WBC: 1.2 10*3/uL — AB (ref 3.9–10.3)
lymph#: 0.2 10*3/uL — ABNORMAL LOW (ref 0.9–3.3)

## 2013-10-01 LAB — COMPREHENSIVE METABOLIC PANEL (CC13)
ALBUMIN: 3.7 g/dL (ref 3.5–5.0)
ALT: 15 U/L (ref 0–55)
ANION GAP: 11 meq/L (ref 3–11)
AST: 15 U/L (ref 5–34)
Alkaline Phosphatase: 69 U/L (ref 40–150)
BILIRUBIN TOTAL: 0.9 mg/dL (ref 0.20–1.20)
BUN: 25.1 mg/dL (ref 7.0–26.0)
CO2: 23 meq/L (ref 22–29)
Calcium: 9.3 mg/dL (ref 8.4–10.4)
Chloride: 103 mEq/L (ref 98–109)
Creatinine: 0.8 mg/dL (ref 0.6–1.1)
GLUCOSE: 148 mg/dL — AB (ref 70–140)
Potassium: 3.4 mEq/L — ABNORMAL LOW (ref 3.5–5.1)
SODIUM: 137 meq/L (ref 136–145)
TOTAL PROTEIN: 7.1 g/dL (ref 6.4–8.3)

## 2013-10-02 ENCOUNTER — Ambulatory Visit
Admission: RE | Admit: 2013-10-02 | Discharge: 2013-10-02 | Disposition: A | Discharge status: Home / Self Care | Payer: Medicare Other | Source: Ambulatory Visit | Attending: Radiation Oncology | Admitting: Radiation Oncology

## 2013-10-02 DIAGNOSIS — C342 Malignant neoplasm of middle lobe, bronchus or lung: Secondary | ICD-10-CM | POA: Diagnosis not present

## 2013-10-03 ENCOUNTER — Ambulatory Visit
Admission: RE | Admit: 2013-10-03 | Discharge: 2013-10-03 | Disposition: A | Discharge status: Home / Self Care | Payer: Medicare Other | Source: Ambulatory Visit | Attending: Radiation Oncology | Admitting: Radiation Oncology

## 2013-10-03 DIAGNOSIS — C342 Malignant neoplasm of middle lobe, bronchus or lung: Secondary | ICD-10-CM | POA: Diagnosis not present

## 2013-10-04 ENCOUNTER — Inpatient Hospital Stay (HOSPITAL_COMMUNITY)
Admission: EM | Admit: 2013-10-04 | Discharge: 2013-10-08 | DRG: 689 | Disposition: A | Discharge status: Home / Self Care | Payer: Medicare Other | Attending: Internal Medicine | Admitting: Internal Medicine

## 2013-10-04 ENCOUNTER — Emergency Department (HOSPITAL_COMMUNITY): Payer: Medicare Other

## 2013-10-04 ENCOUNTER — Encounter (HOSPITAL_COMMUNITY): Payer: Self-pay | Admitting: Emergency Medicine

## 2013-10-04 ENCOUNTER — Inpatient Hospital Stay (HOSPITAL_COMMUNITY): Payer: Medicare Other

## 2013-10-04 DIAGNOSIS — J4489 Other specified chronic obstructive pulmonary disease: Secondary | ICD-10-CM | POA: Diagnosis not present

## 2013-10-04 DIAGNOSIS — T66XXXS Radiation sickness, unspecified, sequela: Secondary | ICD-10-CM | POA: Diagnosis not present

## 2013-10-04 DIAGNOSIS — J449 Chronic obstructive pulmonary disease, unspecified: Secondary | ICD-10-CM | POA: Diagnosis present

## 2013-10-04 DIAGNOSIS — A498 Other bacterial infections of unspecified site: Secondary | ICD-10-CM | POA: Diagnosis present

## 2013-10-04 DIAGNOSIS — D6481 Anemia due to antineoplastic chemotherapy: Secondary | ICD-10-CM | POA: Diagnosis not present

## 2013-10-04 DIAGNOSIS — D709 Neutropenia, unspecified: Secondary | ICD-10-CM | POA: Diagnosis not present

## 2013-10-04 DIAGNOSIS — R918 Other nonspecific abnormal finding of lung field: Secondary | ICD-10-CM

## 2013-10-04 DIAGNOSIS — T451X5A Adverse effect of antineoplastic and immunosuppressive drugs, initial encounter: Secondary | ICD-10-CM | POA: Diagnosis not present

## 2013-10-04 DIAGNOSIS — R5381 Other malaise: Secondary | ICD-10-CM | POA: Diagnosis present

## 2013-10-04 DIAGNOSIS — Z8249 Family history of ischemic heart disease and other diseases of the circulatory system: Secondary | ICD-10-CM | POA: Diagnosis not present

## 2013-10-04 DIAGNOSIS — K208 Other esophagitis without bleeding: Secondary | ICD-10-CM | POA: Diagnosis present

## 2013-10-04 DIAGNOSIS — D252 Subserosal leiomyoma of uterus: Secondary | ICD-10-CM | POA: Diagnosis not present

## 2013-10-04 DIAGNOSIS — E43 Unspecified severe protein-calorie malnutrition: Secondary | ICD-10-CM | POA: Diagnosis present

## 2013-10-04 DIAGNOSIS — N390 Urinary tract infection, site not specified: Secondary | ICD-10-CM | POA: Diagnosis present

## 2013-10-04 DIAGNOSIS — C7800 Secondary malignant neoplasm of unspecified lung: Secondary | ICD-10-CM | POA: Diagnosis not present

## 2013-10-04 DIAGNOSIS — R109 Unspecified abdominal pain: Secondary | ICD-10-CM | POA: Diagnosis present

## 2013-10-04 DIAGNOSIS — M545 Low back pain, unspecified: Secondary | ICD-10-CM | POA: Diagnosis present

## 2013-10-04 DIAGNOSIS — Z8701 Personal history of pneumonia (recurrent): Secondary | ICD-10-CM

## 2013-10-04 DIAGNOSIS — R1310 Dysphagia, unspecified: Secondary | ICD-10-CM | POA: Diagnosis present

## 2013-10-04 DIAGNOSIS — E860 Dehydration: Secondary | ICD-10-CM | POA: Diagnosis present

## 2013-10-04 DIAGNOSIS — C3490 Malignant neoplasm of unspecified part of unspecified bronchus or lung: Secondary | ICD-10-CM | POA: Diagnosis not present

## 2013-10-04 DIAGNOSIS — R143 Flatulence: Secondary | ICD-10-CM | POA: Diagnosis not present

## 2013-10-04 DIAGNOSIS — Z87891 Personal history of nicotine dependence: Secondary | ICD-10-CM

## 2013-10-04 DIAGNOSIS — R32 Unspecified urinary incontinence: Secondary | ICD-10-CM | POA: Diagnosis present

## 2013-10-04 DIAGNOSIS — D61811 Other drug-induced pancytopenia: Secondary | ICD-10-CM | POA: Diagnosis present

## 2013-10-04 DIAGNOSIS — R5383 Other fatigue: Secondary | ICD-10-CM

## 2013-10-04 DIAGNOSIS — Y842 Radiological procedure and radiotherapy as the cause of abnormal reaction of the patient, or of later complication, without mention of misadventure at the time of the procedure: Secondary | ICD-10-CM | POA: Diagnosis present

## 2013-10-04 DIAGNOSIS — N12 Tubulo-interstitial nephritis, not specified as acute or chronic: Secondary | ICD-10-CM | POA: Diagnosis not present

## 2013-10-04 DIAGNOSIS — D61810 Antineoplastic chemotherapy induced pancytopenia: Secondary | ICD-10-CM | POA: Diagnosis not present

## 2013-10-04 DIAGNOSIS — Z6826 Body mass index (BMI) 26.0-26.9, adult: Secondary | ICD-10-CM | POA: Diagnosis not present

## 2013-10-04 DIAGNOSIS — N10 Acute tubulo-interstitial nephritis: Secondary | ICD-10-CM | POA: Diagnosis not present

## 2013-10-04 DIAGNOSIS — R141 Gas pain: Secondary | ICD-10-CM | POA: Diagnosis not present

## 2013-10-04 HISTORY — DX: Cough: R05

## 2013-10-04 HISTORY — DX: Tubulo-interstitial nephritis, not specified as acute or chronic: N12

## 2013-10-04 HISTORY — DX: Cough, unspecified: R05.9

## 2013-10-04 LAB — URINALYSIS, ROUTINE W REFLEX MICROSCOPIC
Bilirubin Urine: NEGATIVE
Glucose, UA: NEGATIVE mg/dL
KETONES UR: NEGATIVE mg/dL
NITRITE: NEGATIVE
PROTEIN: 100 mg/dL — AB
Specific Gravity, Urine: 1.02 (ref 1.005–1.030)
Urobilinogen, UA: 1 mg/dL (ref 0.0–1.0)
pH: 8.5 — ABNORMAL HIGH (ref 5.0–8.0)

## 2013-10-04 LAB — CG4 I-STAT (LACTIC ACID): Lactic Acid, Venous: 1.01 mmol/L (ref 0.5–2.2)

## 2013-10-04 LAB — COMPREHENSIVE METABOLIC PANEL
ALBUMIN: 3.2 g/dL — AB (ref 3.5–5.2)
ALK PHOS: 60 U/L (ref 39–117)
ALT: 10 U/L (ref 0–35)
AST: 11 U/L (ref 0–37)
BUN: 44 mg/dL — ABNORMAL HIGH (ref 6–23)
CO2: 23 mEq/L (ref 19–32)
Calcium: 9.5 mg/dL (ref 8.4–10.5)
Chloride: 100 mEq/L (ref 96–112)
Creatinine, Ser: 0.82 mg/dL (ref 0.50–1.10)
GFR calc Af Amer: 78 mL/min — ABNORMAL LOW (ref >90)
GFR calc non Af Amer: 67 mL/min — ABNORMAL LOW (ref >90)
Glucose, Bld: 132 mg/dL — ABNORMAL HIGH (ref 70–99)
POTASSIUM: 3.5 meq/L — AB (ref 3.7–5.3)
SODIUM: 138 meq/L (ref 137–147)
Total Bilirubin: 0.6 mg/dL (ref 0.3–1.2)
Total Protein: 7 g/dL (ref 6.0–8.3)

## 2013-10-04 LAB — URINE MICROSCOPIC-ADD ON

## 2013-10-04 LAB — DIFFERENTIAL
BASOS PCT: 1 % (ref 0–1)
Basophils Absolute: 0 10*3/uL (ref 0.0–0.1)
EOS ABS: 0 10*3/uL (ref 0.0–0.7)
Eosinophils Relative: 1 % (ref 0–5)
LYMPHS PCT: 22 % (ref 12–46)
Lymphs Abs: 0.4 10*3/uL — ABNORMAL LOW (ref 0.7–4.0)
MONO ABS: 0.5 10*3/uL (ref 0.1–1.0)
Monocytes Relative: 26 % — ABNORMAL HIGH (ref 3–12)
NEUTROS PCT: 50 % (ref 43–77)
Neutro Abs: 1 10*3/uL — ABNORMAL LOW (ref 1.7–7.7)

## 2013-10-04 LAB — CBC
HCT: 21.7 % — ABNORMAL LOW (ref 36.0–46.0)
Hemoglobin: 7.7 g/dL — ABNORMAL LOW (ref 12.0–15.0)
MCH: 32.2 pg (ref 26.0–34.0)
MCHC: 35.5 g/dL (ref 30.0–36.0)
MCV: 90.8 fL (ref 78.0–100.0)
Platelets: 38 10*3/uL — ABNORMAL LOW (ref 150–400)
RBC: 2.39 MIL/uL — ABNORMAL LOW (ref 3.87–5.11)
RDW: 17.1 % — AB (ref 11.5–15.5)
WBC: 1.9 10*3/uL — ABNORMAL LOW (ref 4.0–10.5)

## 2013-10-04 MED ORDER — ONDANSETRON HCL 4 MG/2ML IJ SOLN
4.0000 mg | Freq: Once | INTRAMUSCULAR | Status: AC
  Administered 2013-10-04: 4 mg via INTRAVENOUS
  Filled 2013-10-04: qty 2

## 2013-10-04 MED ORDER — PIPERACILLIN-TAZOBACTAM 3.375 G IVPB 30 MIN
3.3750 g | Freq: Once | INTRAVENOUS | Status: AC
  Administered 2013-10-04: 3.375 g via INTRAVENOUS
  Filled 2013-10-04: qty 50

## 2013-10-04 MED ORDER — SUCRALFATE 1 G PO TABS
1.0000 g | ORAL_TABLET | Freq: Three times a day (TID) | ORAL | Status: DC
  Administered 2013-10-05 – 2013-10-08 (×12): 1 g via ORAL
  Filled 2013-10-04 (×18): qty 1

## 2013-10-04 MED ORDER — VANCOMYCIN HCL IN DEXTROSE 1-5 GM/200ML-% IV SOLN
1000.0000 mg | Freq: Once | INTRAVENOUS | Status: AC
  Administered 2013-10-05: 1000 mg via INTRAVENOUS
  Filled 2013-10-04: qty 200

## 2013-10-04 MED ORDER — POTASSIUM CHLORIDE IN NACL 40-0.9 MEQ/L-% IV SOLN
INTRAVENOUS | Status: DC
  Administered 2013-10-04: via INTRAVENOUS
  Filled 2013-10-04 (×8): qty 1000

## 2013-10-04 MED ORDER — MORPHINE SULFATE 4 MG/ML IJ SOLN
4.0000 mg | Freq: Once | INTRAMUSCULAR | Status: AC
  Administered 2013-10-04: 4 mg via INTRAVENOUS
  Filled 2013-10-04: qty 1

## 2013-10-04 MED ORDER — SODIUM CHLORIDE 0.9 % IV BOLUS (SEPSIS)
1000.0000 mL | Freq: Once | INTRAVENOUS | Status: AC
  Administered 2013-10-04: 1000 mL via INTRAVENOUS

## 2013-10-04 MED ORDER — DEXTROSE 5 % IV SOLN
1.0000 g | Freq: Once | INTRAVENOUS | Status: AC
  Administered 2013-10-04: 1 g via INTRAVENOUS
  Filled 2013-10-04: qty 10

## 2013-10-04 MED ORDER — LIDOCAINE-PRILOCAINE 2.5-2.5 % EX CREA
TOPICAL_CREAM | Freq: Once | CUTANEOUS | Status: DC
  Filled 2013-10-04: qty 5

## 2013-10-04 MED ORDER — MORPHINE SULFATE 4 MG/ML IJ SOLN
4.0000 mg | Freq: Once | INTRAMUSCULAR | Status: AC
Start: 2013-10-04 — End: 2013-10-04
  Administered 2013-10-04: 4 mg via INTRAVENOUS
  Filled 2013-10-04: qty 1

## 2013-10-04 MED ORDER — PRUTECT EX EMUL
1.0000 "application " | Freq: Two times a day (BID) | CUTANEOUS | Status: DC
  Administered 2013-10-05 – 2013-10-08 (×7): 1 via TOPICAL
  Filled 2013-10-04 (×2): qty 45

## 2013-10-04 NOTE — H&P (Addendum)
Hospitalist Admission History and Physical  Patient name: Elizabeth Humphrey Medical record number: [id number] Date of birth: Feb 20, 1934 Age: 80 y.o. Gender: female  Primary Care Provider: Geoffery Lyons, MD Oncologist: Julien Nordmann   Chief Complaint: back pain, pyelonephritis, pancytopenia  History of Present Illness:This is a 80 y.o. year old female with significant past medical history of recent diagnosis of small cell lung ca 07/2013 recently started on radiation and chemotherapy  presenting with low back, difficulty with urination, and neutropenia. Per the daughter, pt was started on chemotherapy within the past 4-5 weeks. Also started on radiation 3-4 weeks ago. Last radiation tx was 2 days ago. Per the daughter, pt has had issue with urinary incontinence of the past 3 weeks, which is a new problem. Pt has had new low back and abdominal pain over the past 2-3 days. No fevers, chills, nausea or vomiting. Daughter also reports pt with treatment by PCP of ? GU/vaginal area soft tissue infection. Daughter unclear of specific tx.  Sxs have seemed to progressively worsen and pt was brought to the ER.  In the ER, UA was indicative of infection. Pt also with noted WBC @ 1.9, Hgb @ 7.7, plts @38 , K @ 3.5.  Afebrile on presentation.   Patient Active Problem List   Diagnosis Date Noted  . Pyelonephritis 10/04/2013  . Small cell lung carcinoma 08/06/2013  . COPD, moderate 07/30/2013  . COPD (chronic obstructive pulmonary disease) 07/30/2013  . Lung mass 07/17/2013  . Cough 07/17/2013   Past Medical History: Past Medical History  Diagnosis Date  . Allergy     bee stings/anaphylaxis  . Hyperlipidemia     hx of  . Lung cancer   . COPD (chronic obstructive pulmonary disease) 07/30/13  . Heart murmur   . History of pneumonia   . Arthritis     Past Surgical History: Past Surgical History  Procedure Laterality Date  . Dilation and curettage of uterus  yrs ago  . Tonsillectomy    . Cataract  extraction      both eyes  . Video bronchoscopy with endobronchial ultrasound N/A 08/04/2013    Procedure: VIDEO BRONCHOSCOPY WITH ENDOBRONCHIAL ULTRASOUND;  Surgeon: Melrose Nakayama, MD;  Location: [facility];  Service: Thoracic;  Laterality: N/A;    Social History: History   Social History  . Marital Status: Married    Spouse Name: N/A    Number of Children: 2  . Years of Education: N/A   Social History Main Topics  . Smoking status: Former Smoker -- 0.50 packs/day for 50 years    Types: Cigarettes    Quit date: 09/12/1991  . Smokeless tobacco: Never Used  . Alcohol Use: 3.5 oz/week    7 drink(s) per week     Comment: Vodka daily  . Drug Use: No  . Sexual Activity: Yes    Birth Control/ Protection: Other-see comments   Other Topics Concern  . None   Social History Narrative  . None    Family History: Family History  Problem Relation Age of Onset  . Heart disease Mother   . Heart disease Father     Allergies: Allergies  Allergen Reactions  . Neosporin [Neomycin-Bacitracin Zn-Polymyx] Rash    Current Facility-Administered Medications  Medication Dose Route Frequency Provider Last Rate Last Dose  . 0.9 % NaCl with KCl 40 mEq / L  infusion   Intravenous Continuous Shanda Howells, MD      . emollient (BIAFINE) cream 1 application  1 application Topical  BID Shanda Howells, MD      . lidocaine-prilocaine (EMLA) cream   Topical Once Shanda Howells, MD      . Derrill Memo ON 10/05/2013] sucralfate (CARAFATE) tablet 1 g  1 g Oral TID WC & HS Shanda Howells, MD       Current Outpatient Prescriptions  Medication Sig Dispense Refill  . acetaminophen (TYLENOL) 325 MG tablet Take 650 mg by mouth every 6 (six) hours as needed (pain).      [name] aspirin 81 MG tablet Take 81 mg by mouth daily.      [name] emollient (BIAFINE) cream Apply 1 application topically 2 (two) times daily. Apply to affected skin area once skin becomes irriatated or red,itching, after rad txs then daily and bedtime       . HYDROcodone-acetaminophen (HYCET) 7.5-325 mg/15 ml solution Take 10 mLs by mouth 4 (four) times daily as needed for moderate pain.  120 mL  0  . lidocaine-prilocaine (EMLA) cream Apply to Port-A-Cath one to 2 hours prior to chemotherapy as directed  30 g  1  . mupirocin ointment (BACTROBAN) 2 % Apply 1 application topically as needed. For cuts and scrapes      . polyethylene glycol (MIRALAX / GLYCOLAX) packet Take 17 g by mouth daily.      . prochlorperazine (COMPAZINE) 10 MG tablet Take 1 tablet (10 mg total) by mouth every 6 (six) hours as needed for nausea or vomiting.  60 tablet  0  . simvastatin (ZOCOR) 40 MG tablet Take 40 mg by mouth at bedtime.      . sucralfate (CARAFATE) 1 G tablet Take 1 tablet (1 g total) by mouth 4 (four) times daily -  with meals and at bedtime.  120 tablet  1  . traMADol (ULTRAM) 50 MG tablet Take 1 tablet by mouth every 6 (six) hours as needed (pain).        Review Of Systems: 12 point ROS negative except as noted above in HPI.  Physical Exam: Filed Vitals:   10/04/13 2038  BP: 117/45  Pulse: 97  Temp: 97.8 F (36.6 C)  Resp:     General: alert and cooperative HEENT: PERRLA and extra ocular movement intact Heart: S1, S2 normal, no murmur, rub or gallop, regular rate and rhythm Lungs: clear to auscultation, no wheezes or rales and unlabored breathing Abdomen: mild abd distension and TTP most predominant in lower abdomen.  Extremities: extremities normal, atraumatic, no cyanosis or edema Skin:no rashes, no ecchymoses Neurology: normal without focal findings  Labs and Imaging: Lab Results  Component Value Date/Time   NA 138 10/04/2013  6:05 PM   NA 137 10/01/2013  8:13 AM   K 3.5* 10/04/2013  6:05 PM   K 3.4* 10/01/2013  8:13 AM   CL 100 10/04/2013  6:05 PM   CO2 23 10/04/2013  6:05 PM   CO2 23 10/01/2013  8:13 AM   BUN 44* 10/04/2013  6:05 PM   BUN 25.1 10/01/2013  8:13 AM   CREATININE 0.82 10/04/2013  6:05 PM   CREATININE 0.8 10/01/2013  8:13 AM    GLUCOSE 132* 10/04/2013  6:05 PM   GLUCOSE 148* 10/01/2013  8:13 AM   Lab Results  Component Value Date   WBC 1.9* 10/04/2013   HGB 7.7* 10/04/2013   HCT 21.7* 10/04/2013   MCV 90.8 10/04/2013   PLT 38* 10/04/2013   Urinalysis    Component Value Date/Time   COLORURINE YELLOW 10/04/2013 1837   APPEARANCEUR TURBID* 10/04/2013 1837  LABSPEC 1.020 10/04/2013 1837   PHURINE 8.5* 10/04/2013 1837   GLUCOSEU NEGATIVE 10/04/2013 1837   HGBUR MODERATE* 10/04/2013 1837   BILIRUBINUR NEGATIVE 10/04/2013 1837   KETONESUR NEGATIVE 10/04/2013 1837   PROTEINUR 100* 10/04/2013 1837   UROBILINOGEN 1.0 10/04/2013 1837   NITRITE NEGATIVE 10/04/2013 1837   LEUKOCYTESUR LARGE* 10/04/2013 1837       Dg Abd Acute W/chest  10/04/2013   CLINICAL DATA:  Body aches and weakness.  History of lung cancer  EXAM: ACUTE ABDOMEN SERIES (ABDOMEN 2 VIEW & CHEST 1 VIEW)  COMPARISON:  None.  FINDINGS: No dilated loops of small bowel identified. Gaseous distension of the proximal large bowel loops noted. There is gas and stool noted within the colon up to the rectum. No evidence for free intraperitoneal air.  Right chest wall Port-A-Cath there is noted with tip in the distal SVC. There is mild cardiac enlargement. Calcified atherosclerotic disease involves the thoracic aorta. Mild diffuse interstitial coarsening is noted bilaterally.  IMPRESSION: 1. No evidence for bowel obstruction. 2. No acute cardiopulmonary abnormalities.   Electronically Signed   By: Kerby Moors M.D.   On: 10/04/2013 18:39     Assessment and Plan: Raymond D Flemister is a 80 y.o. year old female presenting with pyelonephritis, pancytopenia   ID: Noted UA findings concerning for pyelonephritis. Given baseline pancytopenia in setting of chemo-tx and Radx, will place on vanc and zosyn for broad spectrum coverage. Afebrile today. Blood and urine cxs pending. Given reproducible abd pai non exam, will check CT abd pel w/o contrast to better assess anatomy. Will follow  closely.   Heme-Onc: baseline small cell ca biopsy diagnosed. Treating with palliative chemo-tx and Radx. noted concurrent pancytopenia. Likely secondary to chemo tx and radx. No active signs of bleeding. Type and screen pt. Serial CBCs.Hold anticoagulation.  H-O consult in am.    FEN/GI: Hypokalemia. Mildly dry on exam. Elevated BUN in setting of chemotx. Replete. Gently hydrate. Check mag level.  Prophylaxis: SCDs given pancytopenia  Disposition: pending further evaluation.  Code Status: Full Code.         Shanda Howells MD  Pager: [phone]

## 2013-10-04 NOTE — ED Provider Notes (Signed)
CSN: [id number]     Arrival date & time 10/04/13  1651 History   First MD Initiated Contact with Patient 10/04/13 1709     Chief Complaint  Patient presents with  . Dysuria  . Cancer   (Consider location/radiation/quality/duration/timing/severity/associated sxs/prior Treatment) HPI Pt with hx of recently diagnosed lung cancer on current chemo and radiation presenting with dysuria, some incontinence of bladder and foul smelling urine.  Family reports that her blood counts were low earlier in the week at the cancer center.  She has gotten injection of neulasta approx 2 weeks ago.  No fever/chills.  No vomiting or diarrhea.  No cough or shortness of breath.  She does have some lower abdominal discomfort in the suprapubic region.  Family has noted her to be more weak than usual, with decreased appetite.  Some mild confusion.  There are no other associated systemic symptoms, there are no other alleviating or modifying factors.   Past Medical History  Diagnosis Date  . Allergy     bee stings/anaphylaxis  . Hyperlipidemia     hx of  . Lung cancer   . COPD (chronic obstructive pulmonary disease) 07/30/13  . Heart murmur   . History of pneumonia   . Arthritis    Past Surgical History  Procedure Laterality Date  . Dilation and curettage of uterus  yrs ago  . Tonsillectomy    . Cataract extraction      both eyes  . Video bronchoscopy with endobronchial ultrasound N/A 08/04/2013    Procedure: VIDEO BRONCHOSCOPY WITH ENDOBRONCHIAL ULTRASOUND;  Surgeon: Melrose Nakayama, MD;  Location: [facility] OR;  Service: Thoracic;  Laterality: N/A;   Family History  Problem Relation Age of Onset  . Heart disease Mother   . Heart disease Father    History  Substance Use Topics  . Smoking status: Former Smoker -- 0.50 packs/day for 50 years    Types: Cigarettes    Quit date: 09/12/1991  . Smokeless tobacco: Never Used  . Alcohol Use: 3.5 oz/week    7 drink(s) per week     Comment: Vodka daily   OB  History   Grav Para Term Preterm Abortions TAB SAB Ect Mult Living                 Review of Systems ROS reviewed and all otherwise negative except for mentioned in HPI  Allergies  Neosporin  Home Medications   Current Outpatient Rx  Name  Route  Sig  Dispense  Refill  . acetaminophen (TYLENOL) 325 MG tablet   Oral   Take 650 mg by mouth every 6 (six) hours as needed (pain).         [name] aspirin 81 MG tablet   Oral   Take 81 mg by mouth daily.         [name] emollient (BIAFINE) cream   Topical   Apply 1 application topically 2 (two) times daily. Apply to affected skin area once skin becomes irriatated or red,itching, after rad txs then daily and bedtime         . HYDROcodone-acetaminophen (HYCET) 7.5-325 mg/15 ml solution   Oral   Take 10 mLs by mouth 4 (four) times daily as needed for moderate pain.   120 mL   0   . lidocaine-prilocaine (EMLA) cream      Apply to Port-A-Cath one to 2 hours prior to chemotherapy as directed   30 g   1   . mupirocin ointment (BACTROBAN) 2 %  Topical   Apply 1 application topically as needed. For cuts and scrapes         . polyethylene glycol (MIRALAX / GLYCOLAX) packet   Oral   Take 17 g by mouth daily.         . prochlorperazine (COMPAZINE) 10 MG tablet   Oral   Take 1 tablet (10 mg total) by mouth every 6 (six) hours as needed for nausea or vomiting.   60 tablet   0   . simvastatin (ZOCOR) 40 MG tablet   Oral   Take 40 mg by mouth at bedtime.         . sucralfate (CARAFATE) 1 G tablet   Oral   Take 1 tablet (1 g total) by mouth 4 (four) times daily -  with meals and at bedtime.   120 tablet   1     Dissolve in water prior to taking   . traMADol (ULTRAM) 50 MG tablet   Oral   Take 1 tablet by mouth every 6 (six) hours as needed (pain).           BP 104/52  Pulse 109  Temp(Src) 97.8 F (36.6 C) (Oral)  Resp 16  SpO2 99% Vitals reviewed Physical Exam Physical Examination: General appearance - alert,  tired appearing, and in no distress Mental status - alert, oriented to person, place, and time Eyes - no conjunctival injection, no scleral icterus, PERRL Mouth - mucous membranes moist, pharynx normal without lesions Chest - clear to auscultation, no wheezes, rales or rhonchi, symmetric air entry Heart - normal rate, regular rhythm, normal S1, S2, no murmurs, rubs, clicks or gallops Abdomen - soft, mild suprapubic tenderness, no gaurding or rebound, nondistended, no masses or organomegaly Extremities - peripheral pulses normal, no pedal edema, no clubbing or cyanosis Skin - normal coloration and turgor, no rashes  ED Course  Procedures (including critical care time) Labs Review Labs Reviewed  URINALYSIS, ROUTINE W REFLEX MICROSCOPIC - Abnormal; Notable for the following:    APPearance TURBID (*)    pH 8.5 (*)    Hgb urine dipstick MODERATE (*)    Protein, ur 100 (*)    Leukocytes, UA LARGE (*)    All other components within normal limits  CBC - Abnormal; Notable for the following:    WBC 1.9 (*)    RBC 2.39 (*)    Hemoglobin 7.7 (*)    HCT 21.7 (*)    RDW 17.1 (*)    Platelets 38 (*)    All other components within normal limits  COMPREHENSIVE METABOLIC PANEL - Abnormal; Notable for the following:    Potassium 3.5 (*)    Glucose, Bld 132 (*)    BUN 44 (*)    Albumin 3.2 (*)    GFR calc non Af Amer 67 (*)    GFR calc Af Amer 78 (*)    All other components within normal limits  URINE MICROSCOPIC-ADD ON - Abnormal; Notable for the following:    Bacteria, UA MANY (*)    Crystals TRIPLE PHOSPHATE CRYSTALS (*)    All other components within normal limits  DIFFERENTIAL - Abnormal; Notable for the following:    Monocytes Relative 26 (*)    Neutro Abs 1.0 (*)    Lymphs Abs 0.4 (*)    All other components within normal limits  URINE CULTURE  CULTURE, BLOOD (ROUTINE X 2)  CULTURE, BLOOD (ROUTINE X 2)  COMPREHENSIVE METABOLIC PANEL  CBC WITH DIFFERENTIAL  HEMOGLOBIN  A1C  CG4  I-STAT (LACTIC ACID)  TYPE AND SCREEN   Imaging Review Dg Abd Acute W/chest  10/04/2013   CLINICAL DATA:  Body aches and weakness.  History of lung cancer  EXAM: ACUTE ABDOMEN SERIES (ABDOMEN 2 VIEW & CHEST 1 VIEW)  COMPARISON:  None.  FINDINGS: No dilated loops of small bowel identified. Gaseous distension of the proximal large bowel loops noted. There is gas and stool noted within the colon up to the rectum. No evidence for free intraperitoneal air.  Right chest wall Port-A-Cath there is noted with tip in the distal SVC. There is mild cardiac enlargement. Calcified atherosclerotic disease involves the thoracic aorta. Mild diffuse interstitial coarsening is noted bilaterally.  IMPRESSION: 1. No evidence for bowel obstruction. 2. No acute cardiopulmonary abnormalities.   Electronically Signed   By: Kerby Moors M.D.   On: 10/04/2013 18:39    EKG Interpretation   None       MDM   1. Urinary tract infection   2. Neutropenia   3. Lung cancer    Pt presennting with dysuria, on current chemo and radiation, she has low WBC and signs of UTI on urine.  No fever, but will start on IV abx, blood culture and urine cultures obtained.  Lactate normal and normotensive.  D/w GMA who is her primary but states that she will need hospitalist admission due to primary oncological issue.  D/w Dr. Ernestina Patches, [facility] who will see patient in the ED for admission.  Pt and family updated about findings and plan.      Threasa Beards, MD 10/04/13 [phone]

## 2013-10-04 NOTE — ED Notes (Signed)
Pt is a cancer patient.  Daughter states she has been having foul smelling urine and confusion x 1 wk.

## 2013-10-05 ENCOUNTER — Encounter (HOSPITAL_COMMUNITY): Payer: Self-pay | Admitting: *Deleted

## 2013-10-05 ENCOUNTER — Inpatient Hospital Stay (HOSPITAL_COMMUNITY): Payer: Medicare Other

## 2013-10-05 DIAGNOSIS — N390 Urinary tract infection, site not specified: Secondary | ICD-10-CM | POA: Diagnosis present

## 2013-10-05 DIAGNOSIS — T451X5A Adverse effect of antineoplastic and immunosuppressive drugs, initial encounter: Secondary | ICD-10-CM

## 2013-10-05 DIAGNOSIS — D61810 Antineoplastic chemotherapy induced pancytopenia: Secondary | ICD-10-CM

## 2013-10-05 DIAGNOSIS — C3490 Malignant neoplasm of unspecified part of unspecified bronchus or lung: Secondary | ICD-10-CM

## 2013-10-05 LAB — CBC WITH DIFFERENTIAL/PLATELET
BASOS ABS: 0 10*3/uL (ref 0.0–0.1)
Basophils Relative: 1 % (ref 0–1)
EOS ABS: 0 10*3/uL (ref 0.0–0.7)
Eosinophils Relative: 0 % (ref 0–5)
HEMATOCRIT: 20.8 % — AB (ref 36.0–46.0)
Hemoglobin: 7.1 g/dL — ABNORMAL LOW (ref 12.0–15.0)
LYMPHS ABS: 0.4 10*3/uL — AB (ref 0.7–4.0)
Lymphocytes Relative: 20 % (ref 12–46)
MCH: 31.4 pg (ref 26.0–34.0)
MCHC: 34.1 g/dL (ref 30.0–36.0)
MCV: 92 fL (ref 78.0–100.0)
MONO ABS: 0.5 10*3/uL (ref 0.1–1.0)
Monocytes Relative: 23 % — ABNORMAL HIGH (ref 3–12)
NEUTROS ABS: 1.3 10*3/uL — AB (ref 1.7–7.7)
Neutrophils Relative %: 56 % (ref 43–77)
PLATELETS: 33 10*3/uL — AB (ref 150–400)
RBC: 2.26 MIL/uL — ABNORMAL LOW (ref 3.87–5.11)
RDW: 17.4 % — AB (ref 11.5–15.5)
WBC: 2.2 10*3/uL — ABNORMAL LOW (ref 4.0–10.5)

## 2013-10-05 LAB — COMPREHENSIVE METABOLIC PANEL
ALBUMIN: 2.9 g/dL — AB (ref 3.5–5.2)
ALK PHOS: 56 U/L (ref 39–117)
ALT: 9 U/L (ref 0–35)
AST: 11 U/L (ref 0–37)
BILIRUBIN TOTAL: 0.4 mg/dL (ref 0.3–1.2)
BUN: 40 mg/dL — ABNORMAL HIGH (ref 6–23)
CHLORIDE: 104 meq/L (ref 96–112)
CO2: 23 mEq/L (ref 19–32)
CREATININE: 0.92 mg/dL (ref 0.50–1.10)
Calcium: 8.9 mg/dL (ref 8.4–10.5)
GFR calc Af Amer: 68 mL/min — ABNORMAL LOW (ref >90)
GFR calc non Af Amer: 59 mL/min — ABNORMAL LOW (ref >90)
Glucose, Bld: 117 mg/dL — ABNORMAL HIGH (ref 70–99)
Potassium: 3.9 mEq/L (ref 3.7–5.3)
Sodium: 141 mEq/L (ref 137–147)
TOTAL PROTEIN: 6.6 g/dL (ref 6.0–8.3)

## 2013-10-05 LAB — HEMOGLOBIN A1C
Hgb A1c MFr Bld: 6.6 % — ABNORMAL HIGH (ref <5.7)
Mean Plasma Glucose: 143 mg/dL — ABNORMAL HIGH (ref <117)

## 2013-10-05 LAB — ABO/RH: ABO/RH(D): O NEG

## 2013-10-05 MED ORDER — SODIUM CHLORIDE 0.9 % IJ SOLN
10.0000 mL | INTRAMUSCULAR | Status: DC | PRN
  Administered 2013-10-06: 10 mL

## 2013-10-05 MED ORDER — MAGIC MOUTHWASH
10.0000 mL | Freq: Four times a day (QID) | ORAL | Status: DC
  Administered 2013-10-05 – 2013-10-08 (×12): 10 mL via ORAL
  Filled 2013-10-05 (×17): qty 10

## 2013-10-05 MED ORDER — MORPHINE SULFATE 2 MG/ML IJ SOLN
INTRAMUSCULAR | Status: AC
  Filled 2013-10-05: qty 1

## 2013-10-05 MED ORDER — VANCOMYCIN HCL IN DEXTROSE 750-5 MG/150ML-% IV SOLN
750.0000 mg | Freq: Two times a day (BID) | INTRAVENOUS | Status: DC
  Administered 2013-10-05 – 2013-10-07 (×5): 750 mg via INTRAVENOUS
  Filled 2013-10-05 (×6): qty 150

## 2013-10-05 MED ORDER — MAGIC MOUTHWASH
5.0000 mL | Freq: Three times a day (TID) | ORAL | Status: DC | PRN
  Filled 2013-10-05: qty 5

## 2013-10-05 MED ORDER — VANCOMYCIN HCL IN DEXTROSE 1-5 GM/200ML-% IV SOLN
1000.0000 mg | Freq: Two times a day (BID) | INTRAVENOUS | Status: DC
  Filled 2013-10-05: qty 200

## 2013-10-05 MED ORDER — PIPERACILLIN-TAZOBACTAM 3.375 G IVPB
3.3750 g | Freq: Three times a day (TID) | INTRAVENOUS | Status: DC
  Administered 2013-10-05 – 2013-10-07 (×7): 3.375 g via INTRAVENOUS
  Filled 2013-10-05 (×9): qty 50

## 2013-10-05 MED ORDER — MORPHINE SULFATE 2 MG/ML IJ SOLN
2.0000 mg | INTRAMUSCULAR | Status: DC | PRN
  Administered 2013-10-05 (×3): 4 mg via INTRAVENOUS
  Administered 2013-10-05: 2 mg via INTRAVENOUS
  Administered 2013-10-06 (×2): 4 mg via INTRAVENOUS
  Administered 2013-10-06: 2 mg via INTRAVENOUS
  Administered 2013-10-07: 4 mg via INTRAVENOUS
  Administered 2013-10-07 (×2): 2 mg via INTRAVENOUS
  Administered 2013-10-07: 4 mg via INTRAVENOUS
  Administered 2013-10-08 (×2): 2 mg via INTRAVENOUS
  Filled 2013-10-05: qty 2
  Filled 2013-10-05: qty 1
  Filled 2013-10-05 (×2): qty 2
  Filled 2013-10-05 (×2): qty 1
  Filled 2013-10-05: qty 2
  Filled 2013-10-05 (×2): qty 1
  Filled 2013-10-05 (×3): qty 2
  Filled 2013-10-05 (×2): qty 1

## 2013-10-05 NOTE — Progress Notes (Signed)
Patient asleep . BP taken in right arm - values reflect 84/50 once fully awaken rechecked BP and 90/52. Pt trends SBP are 90-120s over 40-60s. Pt is receiving IVF currently. Will cont to monitor closely.

## 2013-10-05 NOTE — Progress Notes (Signed)
ANTIBIOTIC CONSULT NOTE - INITIAL  Pharmacy Consult for Vancomycin and Zosyn  Indication: pyelonephritis, pancytopenia    Allergies  Allergen Reactions  . Neosporin [Neomycin-Bacitracin Zn-Polymyx] Rash    Patient Measurements: Height: 5\' 7"  (170.2 cm) Weight: 170 lb (77.111 kg) IBW/kg (Calculated) : 61.6 Adjusted Body Weight:   Vital Signs: Temp: 98.1 F (36.7 C) (01/25 0445) Temp src: Oral (01/25 0445) BP: 90/52 mmHg (01/25 0500) Pulse Rate: 110 (01/25 0445) Intake/Output from previous day:   Intake/Output from this shift:    Labs:  Recent Labs  10/04/13 1805  WBC 1.9*  HGB 7.7*  PLT 38*  CREATININE 0.82   Estimated Creatinine Clearance: 61.5 ml/min (by C-G formula based on Cr of 0.82). No results found for this basename: VANCOTROUGH, VANCOPEAK, VANCORANDOM, GENTTROUGH, GENTPEAK, GENTRANDOM, TOBRATROUGH, TOBRAPEAK, TOBRARND, AMIKACINPEAK, AMIKACINTROU, AMIKACIN,  in the last 72 hours   Microbiology: No results found for this or any previous visit (from the past 720 hour(s)).  Medical History: Past Medical History  Diagnosis Date  . Allergy     bee stings/anaphylaxis  . Hyperlipidemia     hx of  . Lung cancer   . COPD (chronic obstructive pulmonary disease) 07/30/13  . Heart murmur   . History of pneumonia   . Arthritis   . Pyelonephritis   . Cough     Medications:  Anti-infectives   Start     Dose/Rate Route Frequency Ordered Stop   10/05/13 1200  vancomycin (VANCOCIN) IVPB 1000 mg/200 mL premix     1,000 mg 200 mL/hr over 60 Minutes Intravenous Every 12 hours 10/05/13 0541     10/05/13 0600  piperacillin-tazobactam (ZOSYN) IVPB 3.375 g     3.375 g 12.5 mL/hr over 240 Minutes Intravenous 3 times per day 10/05/13 0541     10/04/13 2315  vancomycin (VANCOCIN) IVPB 1000 mg/200 mL premix     1,000 mg 200 mL/hr over 60 Minutes Intravenous  Once 10/04/13 2300 10/05/13 0215   10/04/13 2315  piperacillin-tazobactam (ZOSYN) IVPB 3.375 g     3.375  g 100 mL/hr over 30 Minutes Intravenous  Once 10/04/13 2300 10/05/13 0012   10/04/13 2000  cefTRIAXone (ROCEPHIN) 1 g in dextrose 5 % 50 mL IVPB     1 g 100 mL/hr over 30 Minutes Intravenous  Once 10/04/13 1948 10/04/13 2036     Assessment: Patient with pyelonephritis, pancytopenia    Goal of Therapy:  Vancomycin trough level 15-20 mcg/ml Zosyn based on renal function   Plan:  Measure antibiotic drug levels at steady state Follow up culture results Vancomycin 1gm iv q12hr Zosyn 3.375g IV Q8H infused over 4hrs.   Tyler Deis, Shea Stakes Crowford 10/05/2013,5:49 AM

## 2013-10-05 NOTE — Progress Notes (Signed)
Antibiotics Consults: Vancomycin, Zosyn  52 yoF with h/o recently dx SCLC currently undergoing chemotherapy and radiation presented 1/24 with c/o dysuria, incontinence and foul smelling urine. Pt found neutropenic, UA c/w UTI.  Vanc and Zosyn for r/o pyelo. CT abd/pelvis neg for bowel obstruction or acute cardiopulmonary abnormalities.   1/24 Rocephin x 1  1/25 >> Zosyn >>  1/25 >> Vanc >   Tmax: afeb  WBCs: 2.2K (pancytopenia, ANC = 1.3K)  Renal: SCr 0.92, CG 55, N 58  1/24 blood x 2 >> in process 1/24 urine >> in process   Plan: Will reduce Vancomycin to 750 mg IV q12h. Continue Zosyn 3.375 gm IV q8h extended infusion over 4 hours. Pharmacy will f/u  Vanessa Shellman, PharmD, BCPS Pager: [phone] 8:30 AM Pharmacy #: [phone]

## 2013-10-05 NOTE — Progress Notes (Addendum)
[facility] PROGRESS NOTE  Elizabeth Humphrey:[id number] DOB: May 13, 1934 DOA: 10/04/2013  PCP: Geoffery Lyons, MD  Brief HPI: This is a 80 y.o. year old female with significant past medical history of recent diagnosis of small cell lung ca 07/2013 recently started on radiation and chemotherapy presented with low back pain, difficulty with urination, and found to have neutropenia. Per the daughter, pt has had issues with urinary incontinence over past 3 weeks.   Past medical history:  Past Medical History  Diagnosis Date  . Allergy     bee stings/anaphylaxis  . Hyperlipidemia     hx of  . Lung cancer   . COPD (chronic obstructive pulmonary disease) 07/30/13  . Heart murmur   . History of pneumonia   . Arthritis   . Pyelonephritis   . Cough     Consultants: Rad Onc  Procedures: None  Antibiotics: Vanc 1/24--> Zosyn 1/24-->  Subjective: Patient feels about same. Still with pain with eating and drinking. Carafate not helping much. Still with occ incontinence but mostly has difficulty with urination.  Objective: Vital Signs  Filed Vitals:   10/05/13 0016 10/05/13 0050 10/05/13 0445 10/05/13 0500  BP: 90/43 109/61 84/50 90/52   Pulse: 104 116 110   Temp: 98.2 F (36.8 C) 98.5 F (36.9 C) 98.1 F (36.7 C)   TempSrc: Oral Oral Oral   Resp:  20 20   Height:      Weight:      SpO2: 95% 100% 100%     Intake/Output Summary (Last 24 hours) at 10/05/13 1037 Last data filed at 10/05/13 0445  Gross per 24 hour  Intake      0 ml  Output      0 ml  Net      0 ml   Filed Weights   10/05/13 0000  Weight: 77.111 kg (170 lb)   General appearance: alert, cooperative, appears stated age and no distress Head: Normocephalic, without obvious abnormality, atraumatic Resp: clear to auscultation bilaterally Cardio: S1, S2 normal, no S3 or S4, systolic murmur: early systolic 3/6, crescendo throughout the precordium, no click and no rub GI: Soft with mild tenderness  in suprapubic area. No rebound rigidity or guarding. No masses or organomegaly. Extremities: extremities normal, atraumatic, no cyanosis or edema Neurologic: Alert. No focal deficits.  Lab Results:  Basic Metabolic Panel:  Recent Labs Lab 09/30/13 1049 10/01/13 0813 10/04/13 1805 10/05/13 0500  NA 138 137 138 141  K 3.6 3.4* 3.5* 3.9  CL  --   --  100 104  CO2 24 23 23 23   GLUCOSE 112 148* 132* 117*  BUN 35.3* 25.1 44* 40*  CREATININE 0.8 0.8 0.82 0.92  CALCIUM 9.6 9.3 9.5 8.9   Liver Function Tests:  Recent Labs Lab 10/01/13 0813 10/04/13 1805 10/05/13 0500  AST 15 11 11   ALT 15 10 9   ALKPHOS 69 60 56  BILITOT 0.90 0.6 0.4  PROT 7.1 7.0 6.6  ALBUMIN 3.7 3.2* 2.9*   CBC:  Recent Labs Lab 09/30/13 1049 10/01/13 0813 10/04/13 1805 10/05/13 0500  WBC 6.0 1.2* 1.9* 2.2*  NEUTROABS 5.2 0.8* 1.0* 1.3*  HGB 9.7* 9.0* 7.7* 7.1*  HCT 28.5* 26.8* 21.7* 20.8*  MCV 95.7 94.0 90.8 92.0  PLT 166 110* 38* 33*    Studies/Results: Ct Abdomen Pelvis Wo Contrast  10/05/2013   CLINICAL DATA:  Right flank pain and lower abdominal pain currently undergoing chemotherapy and radiation therapy for lung carcinoma.  EXAM: CT  ABDOMEN AND PELVIS WITHOUT CONTRAST  TECHNIQUE: Multidetector CT imaging of the abdomen and pelvis was performed following the standard protocol without intravenous contrast.  COMPARISON:  None.  FINDINGS: No evidence of renal calculi or hydronephrosis. No evidence of ureteral calculi or dilatation. No bladder calculi identified. Mild diffuse bladder wall thickening is seen, which is nonspecific and may be seen with cystitis or neurogenic bladder.  A tiny 1 cm subserosal fibroid is seen in the posterior uterus. Adnexal regions are unremarkable. Noncontrast images of the liver, gallbladder, pancreas, spleen, and adrenal glands show no significant abnormalities. No masses or lymphadenopathy identified. No evidence of abscess or free fluid. unopacified bowel loops are  unremarkable in appearance. No evidence of bowel obstruction.  IMPRESSION: No evidence of urolithiasis or hydronephrosis.  Mild diffuse bladder wall thickening, which is nonspecific and may be due to cystitis or neurogenic bladder. Suggest correlation with urinalysis.  Tiny less than 1 cm calcified uterine fibroid.   Electronically Signed   By: Earle Gell M.D.   On: 10/05/2013 09:44   Dg Abd Acute W/chest  10/04/2013   CLINICAL DATA:  Body aches and weakness.  History of lung cancer  EXAM: ACUTE ABDOMEN SERIES (ABDOMEN 2 VIEW & CHEST 1 VIEW)  COMPARISON:  None.  FINDINGS: No dilated loops of small bowel identified. Gaseous distension of the proximal large bowel loops noted. There is gas and stool noted within the colon up to the rectum. No evidence for free intraperitoneal air.  Right chest wall Port-A-Cath there is noted with tip in the distal SVC. There is mild cardiac enlargement. Calcified atherosclerotic disease involves the thoracic aorta. Mild diffuse interstitial coarsening is noted bilaterally.  IMPRESSION: 1. No evidence for bowel obstruction. 2. No acute cardiopulmonary abnormalities.   Electronically Signed   By: Kerby Moors M.D.   On: 10/04/2013 18:39    Medications:  Scheduled: . lidocaine-prilocaine   Topical Once  . magic mouthwash  10 mL Oral QID  . morphine      . piperacillin-tazobactam (ZOSYN)  IV  3.375 g Intravenous Q8H  . PRUTECT  1 application Topical BID  . sucralfate  1 g Oral TID WC & HS  . vancomycin  750 mg Intravenous Q12H   Continuous: . 0.9 % NaCl with KCl 40 mEq / L 75 mL/hr at 10/04/13 2340   JAS:[id number] injection, sodium chloride  Assessment/Plan:  Principal Problem:   UTI (urinary tract infection) Active Problems:   COPD, moderate   COPD (chronic obstructive pulmonary disease)   Small cell lung carcinoma    UTI Urine culture is pending. Continue broad spectrum coverage for now due to immunosuppressed status. CT report reviewed. Patient and  family notified.   History of Small cell Cancer Currently on chemo and radiation for palliation purposes. Will flag radiation oncologist and oncologist. Radiation can be continued.   Odynophagia/Possible Radiation induced Esophagitis Likely a result of radiation. Will utilize magic mouthwash and continue Carafate. No oral lesions noted. If symptoms do not improve might need to consider GI evaluation.   Pancytopenia Likely a result of chemotherapy. No active bleeding noted. Continue current care. Await for counts to rebound. Neutrophil counts greater than 1000. Transfuse for Hgb less than 7.  Dehydration Continue IVF.  Code Status: Full Code  DVT Prophylaxis: SCD's    Family Communication: Discussed with patient and her husband and daughter  Disposition Plan: Will likely return home when better. PT/OT.    LOS: 1 day   [facility] Pager  [phone] 10/05/2013, 10:37 AM  If 8PM-8AM, please contact night-coverage at [email or web address], password [facility]

## 2013-10-05 NOTE — ED Notes (Signed)
Patient up to void several times however she states she is voiding a little at a time which she states is normal. Unable to get accurate output

## 2013-10-05 NOTE — Progress Notes (Signed)
Patient refused to have CT of abd. When called by radiology. Patient wanted to rest. Family requested that patient rest and test be done in am. Made on call provider aware.

## 2013-10-06 ENCOUNTER — Telehealth: Payer: Self-pay | Admitting: Oncology

## 2013-10-06 ENCOUNTER — Ambulatory Visit: Payer: Medicare Other

## 2013-10-06 DIAGNOSIS — E43 Unspecified severe protein-calorie malnutrition: Secondary | ICD-10-CM | POA: Diagnosis present

## 2013-10-06 DIAGNOSIS — D709 Neutropenia, unspecified: Secondary | ICD-10-CM

## 2013-10-06 DIAGNOSIS — D6481 Anemia due to antineoplastic chemotherapy: Secondary | ICD-10-CM

## 2013-10-06 DIAGNOSIS — N12 Tubulo-interstitial nephritis, not specified as acute or chronic: Secondary | ICD-10-CM

## 2013-10-06 DIAGNOSIS — J449 Chronic obstructive pulmonary disease, unspecified: Secondary | ICD-10-CM

## 2013-10-06 DIAGNOSIS — C342 Malignant neoplasm of middle lobe, bronchus or lung: Secondary | ICD-10-CM

## 2013-10-06 DIAGNOSIS — T451X5A Adverse effect of antineoplastic and immunosuppressive drugs, initial encounter: Secondary | ICD-10-CM

## 2013-10-06 DIAGNOSIS — R1319 Other dysphagia: Secondary | ICD-10-CM

## 2013-10-06 LAB — COMPREHENSIVE METABOLIC PANEL
ALT: 10 U/L (ref 0–35)
AST: 10 U/L (ref 0–37)
Albumin: 2.9 g/dL — ABNORMAL LOW (ref 3.5–5.2)
Alkaline Phosphatase: 59 U/L (ref 39–117)
BUN: 42 mg/dL — AB (ref 6–23)
CALCIUM: 9 mg/dL (ref 8.4–10.5)
CO2: 23 meq/L (ref 19–32)
Chloride: 108 mEq/L (ref 96–112)
Creatinine, Ser: 0.92 mg/dL (ref 0.50–1.10)
GFR, EST AFRICAN AMERICAN: 68 mL/min — AB (ref >90)
GFR, EST NON AFRICAN AMERICAN: 59 mL/min — AB (ref >90)
Glucose, Bld: 115 mg/dL — ABNORMAL HIGH (ref 70–99)
Potassium: 4 mEq/L (ref 3.7–5.3)
Sodium: 146 mEq/L (ref 137–147)
Total Bilirubin: 0.5 mg/dL (ref 0.3–1.2)
Total Protein: 6.7 g/dL (ref 6.0–8.3)

## 2013-10-06 LAB — CBC WITH DIFFERENTIAL/PLATELET
Basophils Absolute: 0 10*3/uL (ref 0.0–0.1)
Basophils Relative: 1 % (ref 0–1)
Eosinophils Absolute: 0 10*3/uL (ref 0.0–0.7)
Eosinophils Relative: 0 % (ref 0–5)
HCT: 21.1 % — ABNORMAL LOW (ref 36.0–46.0)
Hemoglobin: 7.2 g/dL — ABNORMAL LOW (ref 12.0–15.0)
Lymphocytes Relative: 18 % (ref 12–46)
Lymphs Abs: 0.5 10*3/uL — ABNORMAL LOW (ref 0.7–4.0)
MCH: 31.4 pg (ref 26.0–34.0)
MCHC: 34.1 g/dL (ref 30.0–36.0)
MCV: 92.1 fL (ref 78.0–100.0)
Monocytes Absolute: 0.6 10*3/uL (ref 0.1–1.0)
Monocytes Relative: 20 % — ABNORMAL HIGH (ref 3–12)
Neutro Abs: 1.7 10*3/uL (ref 1.7–7.7)
Neutrophils Relative %: 61 % (ref 43–77)
Platelets: 19 10*3/uL — CL (ref 150–400)
RBC: 2.29 MIL/uL — ABNORMAL LOW (ref 3.87–5.11)
RDW: 17.8 % — ABNORMAL HIGH (ref 11.5–15.5)
WBC: 2.8 10*3/uL — ABNORMAL LOW (ref 4.0–10.5)

## 2013-10-06 MED ORDER — BOOST PLUS PO LIQD
237.0000 mL | Freq: Two times a day (BID) | ORAL | Status: DC
  Administered 2013-10-06 – 2013-10-08 (×4): 237 mL via ORAL
  Filled 2013-10-06 (×6): qty 237

## 2013-10-06 NOTE — Evaluation (Signed)
Occupational Therapy Evaluation Patient Details Name: Elizabeth Humphrey MRN: [id number] DOB: 04-17-1934 Today's Date: 10/06/2013 Time: 7026-3785 OT Time Calculation (min): 26 min  OT Assessment / Plan / Recommendation History of present illness This is a 80 y.o. year old female with significant past medical history of recent diagnosis of small cell lung ca 07/2013 recently started on radiation and chemotherapy presented with low back pain, difficulty with urination, and found to have neutropenia. Per the daughter, pt has had issues with urinary incontinence over past 3 weeks.    Clinical Impression   Assisted pt up to [facility] then to chair. Husband present and assisting hands on at times. She will benefit from skilled OT services to improve ADL independence for d/c home with husband assisting.    OT Assessment  Patient needs continued OT Services    Follow Up Recommendations  Home health OT;Supervision/Assistance - 24 hour    Barriers to Discharge      Equipment Recommendations   (further assess tub DME needs)    Recommendations for Other Services    Frequency  Min 2X/week    Precautions / Restrictions Precautions Precautions: Fall Restrictions Weight Bearing Restrictions: No   Pertinent Vitals/Pain States throat is sore; nursing made aware.    ADL  Eating/Feeding: Simulated;Independent Where Assessed - Eating/Feeding: Chair Grooming: Simulated;Wash/dry hands;Set up Where Assessed - Grooming: Supported sitting Upper Body Bathing: Simulated;Chest;Right arm;Left arm;Abdomen;Set up;Supervision/safety Where Assessed - Upper Body Bathing: Unsupported sitting Lower Body Bathing: Simulated;Minimal assistance Where Assessed - Lower Body Bathing: Supported sit to stand Upper Body Dressing: Simulated;Minimal assistance Where Assessed - Upper Body Dressing: Unsupported sitting Lower Body Dressing: Simulated;Minimal assistance Where Assessed - Lower Body Dressing: Supported sit to  stand Toilet Transfer: Performed;Minimal assistance Toilet Transfer Method: Arts development officer: Therapist, occupational and Hygiene: Performed;Minimal assistance Where Assessed - Best boy and Hygiene: Sit to stand from 3-in-1 or toilet Equipment Used: Rolling walker ADL Comments: Pt's husband present for session and states he can hep pt at d/c. She has needed assist with transferring in and out of tub. May benefit from a tubseat now.    OT Diagnosis: Generalized weakness  OT Problem List: Decreased strength;Decreased knowledge of use of DME or AE OT Treatment Interventions: Self-care/ADL training;DME and/or AE instruction;Therapeutic activities;Patient/family education   OT Goals(Current goals can be found in the care plan section) Acute Rehab OT Goals Patient Stated Goal: agreeable up with OT; none stated OT Goal Formulation: With patient Time For Goal Achievement: 10/20/13 Potential to Achieve Goals: Good  Visit Information  Last OT Received On: 10/06/13 Assistance Needed: +1 History of Present Illness: This is a 80 y.o. year old female with significant past medical history of recent diagnosis of small cell lung ca 07/2013 recently started on radiation and chemotherapy presented with low back pain, difficulty with urination, and found to have neutropenia. Per the daughter, pt has had issues with urinary incontinence over past 3 weeks.        Prior Elizabeth expects to be discharged to:: Private residence Living Arrangements: Spouse/significant other Available Help at Discharge: Family Type of Home: House Home Access: Stairs to enter Technical brewer of Steps: 3 Home Layout: Able to live on main level with bedroom/bathroom Home Equipment: Walker - 2 wheels;Wheelchair - manual;Bedside commode Prior Function Level of Independence: Needs assistance ADL's / Homemaking  Assistance Needed: uses wheelchair to go to her treatments. Independent with mobility in house. Needs  assist with tub transfers. Sits down in tub. Husband assists with houeshold tasks and has housekeeper too Communication Communication: No difficulties         Vision/Perception     Solicitor Arousal/Alertness: Awake/alert Behavior During Therapy: WFL for tasks assessed/performed Overall Cognitive Status: Within Functional Limits for tasks assessed    Extremity/Trunk Assessment Upper Extremity Assessment Upper Extremity Assessment: Generalized weakness     Mobility Bed Mobility Overal bed mobility: Needs Assistance General bed mobility comments: Pt sidelying when OT arrived and when OT stepped out to get bedpad, husband had helped pt to sitting. Transfers Overall transfer level: Needs assistance Equipment used: Rolling walker (2 wheeled) Transfers: Sit to/from Stand Sit to Stand: Min assist General transfer comment: verbal cues for hand placement and overall safety     Exercise     Balance     End of Session OT - End of Session Equipment Utilized During Treatment: Rolling walker Activity Tolerance: Patient tolerated treatment well Patient left: in chair;with call bell/phone within reach  GO     Jules Schick [phone] 10/06/2013, 12:26 PM

## 2013-10-06 NOTE — Clinical Documentation Improvement (Signed)
Possible Clinical Conditions?  Severe Malnutrition   Protein Calorie Malnutrition Severe Protein Calorie Malnutrition Other Condition Cannot clinically determine  Supporting Information: [place] Assessment on 10-06-13  by  Editor: Christie Beckers, RD (Registered Dietitian)              INITIAL NUTRITION ASSESSMENT   Pt meets criteria for severe MALNUTRITION in the context of chronic illness as evidenced by <75% estimated energy intake with 6% weight loss in the past month.   DOCUMENTATION CODES  Per approved criteria   -Severe malnutrition in the context of chronic illness   INTERVENTION:  - Boost Plus BID  - Educated pt on bland diet  - Hopefully as medications improve odynophagia, PO intake will improve  - Assisted pt with ordering meals  - Will continue to monitor  Thank You, Alessandra Grout, RN, BSN, CCDS, Clinical Documentation Specialist:  [phone]   Cell= [phone] [place] [facility]

## 2013-10-06 NOTE — Telephone Encounter (Signed)
Talked to [name]'s nurse, Joellen Jersey, RN to see how she is doing.  Per Elizabeth Humphrey is doing fine.  Advised her that since her platelet count is 19 and due to her low neutrophil count, she will probably not receive radiation today.  Per Dr. Bobbe Medico, hold treatment today.  Notified [name] on [place] [address].

## 2013-10-06 NOTE — Progress Notes (Signed)
[place]  [phone]) [phone]    HOSPITAL PROGRESS NOTE  DIAGNOSIS: Small cell lung carcinoma, limited stage  Primary site: Lung (Right)  Staging method: AJCC 7th Edition  Clinical: Stage IIIA (T2a, N2, M0) signed by Curt Bears, MD on 08/06/2013 3:32 PM  Summary: Stage IIIA (T2a, N2, M0)   PRIOR THERAPY: None   CURRENT THERAPY: Systemic chemotherapy with carboplatin for an AUC of 5 given on day 1 and etoposide 120 mg per meter squared on days 1, 2 and 3 with Neulasta support given on day 4. This given concurrent with radiation. Status post 3 cycles    HPI: Patient is a very pleasant 80 year old Caucasian female patient of Dr. Julien Nordmann with with limited stage small cell lung cancer. She is status post 3 cycles of systemic chemotherapy with carboplatin and etoposide  with Neulasta support. Cycle 3 comprised from D1 on 1/14 to D4 on 1/17 (Neulasta given on 1/17) She is also undergoing radiation therapy under the care Dr. Sondra Come (last treatment given on 1/23) Her recent CT scan of the chest on 1/915  revealed decrease in the 1.8 cm right middle lobe nodule. There were also surrounding radiation changes noted. The 5 mm medial left upper lobe nodule was unchanged. Also   improved thoracic lymphadenopathy was noted. He was admitted on 10/04/12 with 2 day history of  low back, difficulty with urination (preceeded by 3 week history of urinary incontinence) He was found to have neutropenia with a WBC of 1.9. His Hb was 7.7, platelets 38k. Anticoagulation was held, and SCDs provided. UA was indicative of infection, possible pyelonephritis.He was placed on IV Vanco and Zosyn, after cultures were obtained.  He was afebrile on admission.CXR was negative for acute disease. CT of the abdomen and pelvis on 1/24 without contrast showed No evidence of urolithiasis or hydronephrosis. Mild diffuse bladder wall thickening, which is nonspecific and may be due to cystitis or neurogenic  bladder.Tiny less than 1 cm calcified uterine fibroid was seen.No masses or lymphadenopathy identified. No evidence of abscess or free fluid. unopacified bowel loops are unremarkable in appearance. No evidence of bowel obstruction Due to poor oral intake, some odynophagia, Nutrition is involved in her care. Feeling better this morning.   Past Medical History  Diagnosis Date  . Allergy     bee stings/anaphylaxis  . Hyperlipidemia     hx of  . Lung cancer   . COPD (chronic obstructive pulmonary disease) 07/30/13  . Heart murmur   . History of pneumonia   . Arthritis   . Pyelonephritis   . Cough    Past Surgical History  Procedure Laterality Date  . Dilation and curettage of uterus  yrs ago  . Tonsillectomy    . Cataract extraction      both eyes  . Video bronchoscopy with endobronchial ultrasound N/A 08/04/2013    Procedure: VIDEO BRONCHOSCOPY WITH ENDOBRONCHIAL ULTRASOUND;  Surgeon: Melrose Nakayama, MD;  Location: [facility];  Service: Thoracic;  Laterality: N/A;   MEDICATIONS:  Scheduled Meds: . lactose free nutrition  237 mL Oral BID BM  . lidocaine-prilocaine   Topical Once  . magic mouthwash  10 mL Oral QID  . piperacillin-tazobactam (ZOSYN)  IV  3.375 g Intravenous Q8H  . PRUTECT  1 application Topical BID  . sucralfate  1 g Oral TID WC & HS  . vancomycin  750 mg Intravenous Q12H   Continuous Infusions: . 0.9 % NaCl with KCl 40 mEq / L 75  mL/hr at 10/04/13 2340   PRN Meds:.morphine injection, sodium chloride  ALLERGIES:   Allergies  Allergen Reactions  . Neosporin [Neomycin-Bacitracin Zn-Polymyx] Rash   For social and family history please refer to our prior notes.   PHYSICAL EXAMINATION:   Filed Vitals:   10/06/13 0440  BP: 112/59  Pulse: 105  Temp: 97.9 F (36.6 C)  Resp: 20   Filed Weights   10/05/13 0000  Weight: 170 lb (77.16 kg)    80 year old female in no acute distress A. and O. x3 General well-developed  HEENT: Normocephalic,  atraumatic, PERRLA. Oral cavity without thrush or mucositis Neck supple. no thyromegaly, no cervical or supraclavicular adenopathy  Lungs clear bilaterally . No wheezing, rhonchi or rales. Cardiac: regular rate, 2/6 systolic  Murmur no rubs or gallops Abdomen soft, mild suprapubic tenderness, bowel sounds x4.  Extremities no clubbing cyanosis or edema. No bruising or petechial rash Neuro: non focal  LABORATORY/RADIOLOGY DATA:   Recent Labs Lab 09/30/13 1049 10/01/13 0813 10/04/13 1805 10/05/13 0500 10/06/13 0625  WBC 6.0 1.2* 1.9* 2.2* 2.8*  HGB 9.7* 9.0* 7.7* 7.1* 7.2*  HCT 28.5* 26.8* 21.7* 20.8* 21.1*  PLT 166 110* 38* 33* 19*  MCV 95.7 94.0 90.8 92.0 92.1  MCH 32.5 31.6 32.2 31.4 31.4  MCHC 33.9 33.6 35.5 34.1 34.1  RDW 18.0* 17.7* 17.1* 17.4* 17.8*  LYMPHSABS 0.6* 0.2* 0.4* 0.4* 0.5*  MONOABS 0.1 0.1 0.5 0.5 0.6  EOSABS 0.0 0.0 0.0 0.0 0.0  BASOSABS 0.0 0.0 0.0 0.0 0.0    CMP    Recent Labs Lab 09/30/13 1049 10/01/13 0813 10/04/13 1805 10/05/13 0500 10/06/13 0625  NA 138 137 138 141 146  K 3.6 3.4* 3.5* 3.9 4.0  CL  --   --  100 104 108  CO2 24 23 23 23 23   GLUCOSE 112 148* 132* 117* 115*  BUN 35.3* 25.1 44* 40* 42*  CREATININE 0.8 0.8 0.82 0.92 0.92  CALCIUM 9.6 9.3 9.5 8.9 9.0  AST  --  15 11 11 10   ALT  --  15 10 9 10   ALKPHOS  --  69 60 56 59  BILITOT  --  0.90 0.6 0.4 0.5        Component Value Date/Time   BILITOT 0.5 10/06/2013 0625   BILITOT 0.90 10/01/2013 0813       Urinalysis    Component Value Date/Time   COLORURINE YELLOW 10/04/2013 1837   APPEARANCEUR TURBID* 10/04/2013 1837   LABSPEC 1.020 10/04/2013 1837   PHURINE 8.5* 10/04/2013 1837   GLUCOSEU NEGATIVE 10/04/2013 1837   HGBUR MODERATE* 10/04/2013 1837   BILIRUBINUR NEGATIVE 10/04/2013 1837   KETONESUR NEGATIVE 10/04/2013 1837   PROTEINUR 100* 10/04/2013 1837   UROBILINOGEN 1.0 10/04/2013 1837   NITRITE NEGATIVE 10/04/2013 1837   LEUKOCYTESUR LARGE* 10/04/2013 1837    Drugs of  Abuse  No results found for this basename: labopia, cocainscrnur, labbenz, amphetmu, thcu, labbarb     Liver Function Tests:  Recent Labs Lab 10/01/13 0813 10/04/13 1805 10/05/13 0500 10/06/13 0625  AST 15 11 11 10   ALT 15 10 9 10   ALKPHOS 69 60 56 59  BILITOT 0.90 0.6 0.4 0.5  PROT 7.1 7.0 6.6 6.7  ALBUMIN 3.7 3.2* 2.9* 2.9*   No results found for this basename: LIPASE, AMYLASE,  in the last 168 hours No results found for this basename: AMMONIA,  in the last 168 hours  CBG: No results found for this basename: GLUCAP,  in the  last 168 hours Hgb A1c  Recent Labs  10/05/13 0500  HGBA1C 6.6*    Radiology Studies:  Ct Abdomen Pelvis Wo Contrast  10/05/2013   CLINICAL DATA:  Right flank pain and lower abdominal pain currently undergoing chemotherapy and radiation therapy for lung carcinoma.  EXAM: CT ABDOMEN AND PELVIS WITHOUT CONTRAST  TECHNIQUE: Multidetector CT imaging of the abdomen and pelvis was performed following the standard protocol without intravenous contrast.  COMPARISON:  None.  FINDINGS: No evidence of renal calculi or hydronephrosis. No evidence of ureteral calculi or dilatation. No bladder calculi identified. Mild diffuse bladder wall thickening is seen, which is nonspecific and may be seen with cystitis or neurogenic bladder.  A tiny 1 cm subserosal fibroid is seen in the posterior uterus. Adnexal regions are unremarkable. Noncontrast images of the liver, gallbladder, pancreas, spleen, and adrenal glands show no significant abnormalities. No masses or lymphadenopathy identified. No evidence of abscess or free fluid. unopacified bowel loops are unremarkable in appearance. No evidence of bowel obstruction.  IMPRESSION: No evidence of urolithiasis or hydronephrosis.  Mild diffuse bladder wall thickening, which is nonspecific and may be due to cystitis or neurogenic bladder. Suggest correlation with urinalysis.  Tiny less than 1 cm calcified uterine fibroid.    Electronically Signed   By: Earle Gell M.D.   On: 10/05/2013 09:44   Ct Chest W Contrast  09/19/2013   CLINICAL DATA:  Follow-up lung cancer, diagnosed 07/2013, chemotherapy/XRT ongoing.  EXAM: CT CHEST WITH CONTRAST  TECHNIQUE: Multidetector CT imaging of the chest was performed during intravenous contrast administration.  CONTRAST:  63mL OMNIPAQUE IOHEXOL 300 MG/ML  SOLN  COMPARISON:  PET-CT dated 07/24/2013.  FINDINGS: 1.8 x 1.4 cm right middle lobe nodule (series 5/image 33), corresponding to known primary bronchogenic neoplasm, previously 2.8 x 3.8 cm. Associated surrounding radiation changes.  5 mm medial left upper lobe nodule, hypermetabolic on PET, unchanged.  Biapical pleural parenchymal scarring, right greater than left. Additional mild nodularity/scarring in the posterior right upper lobe (series 5/ images 12, 13, and 16), unchanged. Additional mild nodularity in the lingula and left lower lobe (series 5/image 31, 40, and 42), unchanged. No pleural effusion or pneumothorax.  Visualized thyroid is unremarkable.  The heart is normal in size. No pericardial effusion. Coronary atherosclerosis. Atherosclerotic calcifications of the aortic arch.  Right chest port terminating at the cavoatrial junction.  Improving thoracic lymphadenopathy, including:  --8 mm short axis right hilar node (series 2/ image 24), previously 16 mm  --14 mm short axis subcarinal node (series 2/ image 26), previously 26 mm  --8 mm short axis infrahilar/right lower lobe node (series 2/ image 29), previously 18 mm  Visualized upper abdomen is grossly unremarkable, noting a 10 mm hypoenhancing lesion in the lateral spleen (series 2/ image 54), likely benign.  Degenerative changes of the visualized thoracolumbar spine.  IMPRESSION: 1.8 cm right middle lobe nodule, corresponding to known primary bronchogenic neoplasm, decreased. Surrounding radiation changes.  5 mm medial left upper lobe nodule, hypermetabolic on PET, unchanged.   Improving thoracic lymphadenopathy, as described above.   Electronically Signed   By: Julian Hy M.D.   On: 09/19/2013 14:06   Dg Abd Acute W/chest  10/04/2013   CLINICAL DATA:  Body aches and weakness.  History of lung cancer  EXAM: ACUTE ABDOMEN SERIES (ABDOMEN 2 VIEW & CHEST 1 VIEW)  COMPARISON:  None.  FINDINGS: No dilated loops of small bowel identified. Gaseous distension of the proximal large bowel loops noted. There is gas  and stool noted within the colon up to the rectum. No evidence for free intraperitoneal air.  Right chest wall Port-A-Cath there is noted with tip in the distal SVC. There is mild cardiac enlargement. Calcified atherosclerotic disease involves the thoracic aorta. Mild diffuse interstitial coarsening is noted bilaterally.  IMPRESSION: 1. No evidence for bowel obstruction. 2. No acute cardiopulmonary abnormalities.   Electronically Signed   By: Kerby Moors M.D.   On: 10/04/2013 18:39       ASSESSMENT AND PLAN:   [age] year old woman with recent diagnosis of  limited stage small cell lung cancer currently undergoing systemic chemotherapy with carboplatin and etoposide status post 3 cycles (D1 on 09/24/13)  concurrent with radiation (last given on 1/23). Next chemo,D1  C4 is planned for 10/15/13 and q 21 days   2. Pancytopenia. Last Neulasta given on 1/17. Current WBC is rising to 2.8. ANC 1.7 (Monitor). Hb is 7.2 but no acute bleed is noted, in the setting of recent chemo, infection, dilution and multiple blood draws. Repeat CBC in am. Platelets are lower at 19k (multifactorial) . May need to transfuse when <10 or if bleeding issues arise.   3. Full Code  4. Pyelonephritis, on Vanco Zosyn, as per IM  5. Back pain, lower abdominal pain, likely secondary to  Infection  6. Odynophagia, in the setting of recent chemo, on magic mouthwash and sucralfate as well as nutrition evaluation.   Dr. Julien Nordmann to write an addendum to this note. Thank you for alling [place] the  opportunity to participate in the care of this nice patient.   Rondel Jumbo, PA-C 10/06/2013, 10:05 AM  ADDENDUM: Hematology/Oncology Attending: The patient is seen and examined. I agree with the above note. This is a very pleasant 80 years old white female with limited stage small cell lung cancer who is undergoing systemic chemotherapy with carboplatin and etoposide concurrent with radiation. The patient has been tolerating her treatment fairly well. She was admitted to [facility] with urinary incontinence and recent imaging studies were suggestive of pyelonephritis. She is currently undergoing treatment with Zosyn and vancomycin and tolerating it well. She is feeling fine today with no specific complaints except for fatigue. I will place her chemotherapy on hold for now until she has complete recovery from the recent infection. The patient has mild leukocytopenia and but no neutropenia. She received Neulasta injection after her chemotherapy. Platelets count are low but the patient has no bleeding issues. We'll continue to monitor for now I would consider platelet transfusion if she is bleeding or platelets count less than 10,000. The patient also has chemotherapy-induced anemia and I would recommend PRBCs transfusion to keep her hemoglobin over 8.0 G./DL. Thank you for taking good care of Ms. Mcdonagh. I will continue to follow the patient with you and assist in her management on as-needed basis.

## 2013-10-06 NOTE — Progress Notes (Signed)
INITIAL NUTRITION ASSESSMENT  Pt meets criteria for severe MALNUTRITION in the context of chronic illness as evidenced by <75% estimated energy intake with 6% weight loss in the past month.  DOCUMENTATION CODES Per approved criteria  -Severe malnutrition in the context of chronic illness   INTERVENTION: - Boost Plus BID - Educated pt on bland diet - Hopefully as medications improve odynophagia, PO intake will improve - Assisted pt with ordering meals - Will continue to monitor   NUTRITION DIAGNOSIS: Inadequate oral intake related to odynophagia/possible radiation induced esophagitis as evidenced by <25% meal intake.   Goal: Pt to consume >90% of meals/supplements  Monitor:  Weights, labs, intake, odynophagia  Reason for Assessment: Malnutrition screening tool   80 y.o. female  Admitting Dx: UTI (urinary tract infection)  ASSESSMENT: Pt with history of recent diagnosis of small cell lung ca 07/2013 recently started on radiation and chemotherapy presenting with low back, difficulty with urination, and neutropenia. Per the daughter, pt was started on chemotherapy within the past 4-5 weeks. Also started on radiation 3-4 weeks ago. Last radiation tx was 2 days ago. Per the daughter, pt has had issue with urinary incontinence of the past 3 weeks, which is a new problem. Pt has had new low back and abdominal pain over the past 2-3 days. Pt has been seen by outpatient [facility] RD earlier this month - weight documented at 181.1 on 09/16/13. Found to have urinary tract infection.   Met with pt and husband who report pt was eating well until last week when her intake declined due to pain with swallowing. Pt on Carafate for this which pt admits to not liking, but has been helping with pain. Pt reports she was lucky if she ate 1 meal/day. Has been trying to consume high protein foods like [place] yogurt and Boost. Husband reports warm liquids are better tolerated than cold liquids.  Pt able to consume popsicle this morning. Noted pt's weight down 11 pounds in the past 3 weeks. Pt getting Magic mouthwash.    Height: Ht Readings from Last 1 Encounters:  10/05/13 5' 7"  (1.702 m)    Weight: Wt Readings from Last 1 Encounters:  10/05/13 170 lb (77.111 kg)    Ideal Body Weight: 135 lb  % Ideal Body Weight: 126%  Wt Readings from Last 10 Encounters:  10/05/13 170 lb (77.111 kg)  09/30/13 174 lb 8 oz (79.153 kg)  09/23/13 182 lb 3.2 oz (82.645 kg)  09/16/13 181 lb 1.6 oz (82.146 kg)  09/10/13 179 lb 3.2 oz (81.285 kg)  09/09/13 178 lb 11.2 oz (81.058 kg)  09/02/13 186 lb 1.6 oz (84.414 kg)  08/27/13 183 lb 14.4 oz (83.416 kg)  08/26/13 185 lb 11.2 oz (84.233 kg)  08/14/13 189 lb 8 oz (85.957 kg)    Usual Body Weight: 181 lb earlier this month  % Usual Body Weight: 94%  BMI:  Body mass index is 26.62 kg/(m^2).  Estimated Nutritional Needs: Kcal: 1950-2150 Protein: 90-115g Fluid: 1.9-2.1L/day   Skin: Stage II coccyx pressure ulcer  Diet Order: General  EDUCATION NEEDS: -No education needs identified at this time   Intake/Output Summary (Last 24 hours) at 10/06/13 0940 Last data filed at 10/06/13 0900  Gross per 24 hour  Intake   1000 ml  Output    825 ml  Net    175 ml    Last BM: 1/25  Labs:   Recent Labs Lab 10/04/13 1805 10/05/13 0500 10/06/13 0625  NA  138 141 146  K 3.5* 3.9 4.0  CL 100 104 108  CO2 23 23 23   BUN 44* 40* 42*  CREATININE 0.82 0.92 0.92  CALCIUM 9.5 8.9 9.0  GLUCOSE 132* 117* 115*    CBG (last 3)  No results found for this basename: GLUCAP,  in the last 72 hours  Scheduled Meds: . lidocaine-prilocaine   Topical Once  . magic mouthwash  10 mL Oral QID  . piperacillin-tazobactam (ZOSYN)  IV  3.375 g Intravenous Q8H  . PRUTECT  1 application Topical BID  . sucralfate  1 g Oral TID WC & HS  . vancomycin  750 mg Intravenous Q12H    Continuous Infusions: . 0.9 % NaCl with KCl 40 mEq / L 75 mL/hr at  10/04/13 2340    Past Medical History  Diagnosis Date  . Allergy     bee stings/anaphylaxis  . Hyperlipidemia     hx of  . Lung cancer   . COPD (chronic obstructive pulmonary disease) 07/30/13  . Heart murmur   . History of pneumonia   . Arthritis   . Pyelonephritis   . Cough     Past Surgical History  Procedure Laterality Date  . Dilation and curettage of uterus  yrs ago  . Tonsillectomy    . Cataract extraction      both eyes  . Video bronchoscopy with endobronchial ultrasound N/A 08/04/2013    Procedure: VIDEO BRONCHOSCOPY WITH ENDOBRONCHIAL ULTRASOUND;  Surgeon: Melrose Nakayama, MD;  Location: [place];  Service: Thoracic;  Laterality: N/A;    Mikey College MS, [place], [place] Pager [phone] After Hours Pager
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
## 2013-10-06 NOTE — Progress Notes (Signed)
[facility] PROGRESS NOTE  Elizabeth Humphrey QPY:[id number] DOB: 11-16-1933 DOA: 10/04/2013 PCP: Geoffery Lyons, MD Brief narrative: Patient is a 80 year old female with significant past medical history of small cell lung carcinoma and 11 2014 recently started on radiation and chemotherapy who presented with low back pain, difficulty with urination, and neutropenia.  Assessment/Plan: UTI  Urine culture is pending. Continue broad spectrum coverage for now due to immunosuppressed status. CT report reviewed. Patient and family notified - Once urine cultures available we'll plan on tailoring antibiotic regimen further.   History of Small cell Cancer  Currently on chemo and radiation for palliation purposes. Oncology on board  Odynophagia/Possible Radiation induced Esophagitis  Likely a result of radiation. Will utilize magic mouthwash and continue Carafate. No new complaints today  Pancytopenia  Likely a result of chemotherapy. No active bleeding noted. Continue current care. Await for counts to rebound. Neutrophil counts greater than 1000. Transfuse for Hgb less than 7.   Dehydration  Continue IVF.   Code Status: Full Family Communication: Discussed with patient and daughter at bedside  Disposition Plan: Will transition to oral antibiotics once urine sensitivities reported   Consultants:  Oncology  Procedures:  None  Antibiotics:  Vanco and Zosyn  HPI/Subjective: Patient has no new complaints. No acute issues reported overnight. Reports some discomfort with urination  Objective: Filed Vitals:   10/06/13 1410  BP: 114/72  Pulse: 100  Temp: 97.5 F (36.4 C)  Resp: 18    Intake/Output Summary (Last 24 hours) at 10/06/13 1451 Last data filed at 10/06/13 1300  Gross per 24 hour  Intake    615 ml  Output   1125 ml  Net   -510 ml   Filed Weights   10/05/13 0000  Weight: 77.111 kg (170 lb)    Exam:   General:  Pt in NAD, Alert and  awake  Cardiovascular: RRR, no MRG  Respiratory: CTA BL, no wheezes  Abdomen: soft, NT, ND  Musculoskeletal: no cyanosis or clubbing   Data Reviewed: Basic Metabolic Panel:  Recent Labs Lab 09/30/13 1049 10/01/13 0813 10/04/13 1805 10/05/13 0500 10/06/13 0625  NA 138 137 138 141 146  K 3.6 3.4* 3.5* 3.9 4.0  CL  --   --  100 104 108  CO2 24 23 23 23 23   GLUCOSE 112 148* 132* 117* 115*  BUN 35.3* 25.1 44* 40* 42*  CREATININE 0.8 0.8 0.82 0.92 0.92  CALCIUM 9.6 9.3 9.5 8.9 9.0   Liver Function Tests:  Recent Labs Lab 10/01/13 0813 10/04/13 1805 10/05/13 0500 10/06/13 0625  AST 15 11 11 10   ALT 15 10 9 10   ALKPHOS 69 60 56 59  BILITOT 0.90 0.6 0.4 0.5  PROT 7.1 7.0 6.6 6.7  ALBUMIN 3.7 3.2* 2.9* 2.9*   No results found for this basename: LIPASE, AMYLASE,  in the last 168 hours No results found for this basename: AMMONIA,  in the last 168 hours CBC:  Recent Labs Lab 09/30/13 1049 10/01/13 0813 10/04/13 1805 10/05/13 0500 10/06/13 0625  WBC 6.0 1.2* 1.9* 2.2* 2.8*  NEUTROABS 5.2 0.8* 1.0* 1.3* 1.7  HGB 9.7* 9.0* 7.7* 7.1* 7.2*  HCT 28.5* 26.8* 21.7* 20.8* 21.1*  MCV 95.7 94.0 90.8 92.0 92.1  PLT 166 110* 38* 33* 19*   Cardiac Enzymes: No results found for this basename: CKTOTAL, CKMB, CKMBINDEX, TROPONINI,  in the last 168 hours BNP (last 3 results) No results found for this basename: PROBNP,  in the last 8760 hours CBG:  No results found for this basename: GLUCAP,  in the last 168 hours  Recent Results (from the past 240 hour(s))  URINE CULTURE     Status: None   Collection Time    10/04/13  6:37 PM      Result Value Range Status   Specimen Description URINE, CLEAN CATCH   Final   Special Requests NONE   Final   Culture  Setup Time     Final   Value: 10/05/2013 06:22     Performed at [place]     Final   Value: >=100,000 COLONIES/ML     Performed at [facility]   Culture     Final   Value: [place]     Performed at [facility]   Report Status PENDING   Incomplete  CULTURE, BLOOD (ROUTINE X 2)     Status: None   Collection Time    10/04/13  8:15 PM      Result Value Range Status   Specimen Description BLOOD RIGHT ANTECUBITAL   Final   Special Requests BOTTLES DRAWN AEROBIC AND ANAEROBIC 4CC   Final   Culture  Setup Time     Final   Value: 10/05/2013 05:38     Performed at [facility]   Culture     Final   Value:        BLOOD CULTURE RECEIVED NO GROWTH TO DATE CULTURE WILL BE HELD FOR 5 DAYS BEFORE ISSUING A FINAL NEGATIVE REPORT     Performed at [facility]   Report Status PENDING   Incomplete  CULTURE, BLOOD (ROUTINE X 2)     Status: None   Collection Time    10/04/13  8:20 PM      Result Value Range Status   Specimen Description BLOOD RIGHT HAND   Final   Special Requests BOTTLES DRAWN AEROBIC ONLY 5CC   Final   Culture  Setup Time     Final   Value: 10/05/2013 05:38     Performed at [facility]   Culture     Final   Value:        BLOOD CULTURE RECEIVED NO GROWTH TO DATE CULTURE WILL BE HELD FOR 5 DAYS BEFORE ISSUING A FINAL NEGATIVE REPORT     Performed at [facility]   Report Status PENDING   Incomplete     Studies: Ct Abdomen Pelvis Wo Contrast  10/05/2013   CLINICAL DATA:  Right flank pain and lower abdominal pain currently undergoing chemotherapy and radiation therapy for lung carcinoma.  EXAM: CT ABDOMEN AND PELVIS WITHOUT CONTRAST  TECHNIQUE: Multidetector CT imaging of the abdomen and pelvis was performed following the standard protocol without intravenous contrast.  COMPARISON:  None.  FINDINGS: No evidence of renal calculi or hydronephrosis. No evidence of ureteral calculi or dilatation. No bladder calculi identified. Mild diffuse bladder wall thickening is seen, which is nonspecific and may be seen with cystitis or neurogenic bladder.  A tiny 1 cm subserosal fibroid is seen in the posterior uterus. Adnexal regions  are unremarkable. Noncontrast images of the liver, gallbladder, pancreas, spleen, and adrenal glands show no significant abnormalities. No masses or lymphadenopathy identified. No evidence of abscess or free fluid. unopacified bowel loops are unremarkable in appearance. No evidence of bowel obstruction.  IMPRESSION: No evidence of urolithiasis or hydronephrosis.  Mild diffuse bladder wall thickening, which is nonspecific and may be due to cystitis  or neurogenic bladder. Suggest correlation with urinalysis.  Tiny less than 1 cm calcified uterine fibroid.   Electronically Signed   By: Earle Gell M.D.   On: 10/05/2013 09:44   Dg Abd Acute W/chest  10/04/2013   CLINICAL DATA:  Body aches and weakness.  History of lung cancer  EXAM: ACUTE ABDOMEN SERIES (ABDOMEN 2 VIEW & CHEST 1 VIEW)  COMPARISON:  None.  FINDINGS: No dilated loops of small bowel identified. Gaseous distension of the proximal large bowel loops noted. There is gas and stool noted within the colon up to the rectum. No evidence for free intraperitoneal air.  Right chest wall Port-A-Cath there is noted with tip in the distal SVC. There is mild cardiac enlargement. Calcified atherosclerotic disease involves the thoracic aorta. Mild diffuse interstitial coarsening is noted bilaterally.  IMPRESSION: 1. No evidence for bowel obstruction. 2. No acute cardiopulmonary abnormalities.   Electronically Signed   By: Kerby Moors M.D.   On: 10/04/2013 18:39    Scheduled Meds: . lactose free nutrition  237 mL Oral BID BM  . lidocaine-prilocaine   Topical Once  . magic mouthwash  10 mL Oral QID  . piperacillin-tazobactam (ZOSYN)  IV  3.375 g Intravenous Q8H  . PRUTECT  1 application Topical BID  . sucralfate  1 g Oral TID WC & HS  . vancomycin  750 mg Intravenous Q12H   Continuous Infusions: . 0.9 % NaCl with KCl 40 mEq / L 75 mL/hr at 10/04/13 2340    Principal Problem:   UTI (urinary tract infection) Active Problems:   COPD, moderate   COPD  (chronic obstructive pulmonary disease)   Small cell lung carcinoma   Protein-calorie malnutrition, severe    Time spent: > 35 minutes    Velvet Bathe  [facility] Pager [phone] If 7PM-7AM, please contact night-coverage at [email or web address], password [facility] 10/06/2013, 2:51 PM  LOS: 2 days

## 2013-10-06 NOTE — Telephone Encounter (Signed)
Returned [employer].  She had a question about whether her mother will have radiation treatment tomorrow.  Advised her that it will depend on her platelet count and neutrophil count tomorrow.

## 2013-10-07 ENCOUNTER — Ambulatory Visit: Payer: Medicare Other

## 2013-10-07 ENCOUNTER — Telehealth: Payer: Self-pay | Admitting: Oncology

## 2013-10-07 DIAGNOSIS — E43 Unspecified severe protein-calorie malnutrition: Secondary | ICD-10-CM

## 2013-10-07 DIAGNOSIS — N10 Acute tubulo-interstitial nephritis: Secondary | ICD-10-CM | POA: Diagnosis present

## 2013-10-07 LAB — CBC WITH DIFFERENTIAL/PLATELET
Basophils Absolute: 0 10*3/uL (ref 0.0–0.1)
Basophils Relative: 0 % (ref 0–1)
EOS PCT: 0 % (ref 0–5)
Eosinophils Absolute: 0 10*3/uL (ref 0.0–0.7)
HCT: 20.2 % — ABNORMAL LOW (ref 36.0–46.0)
Hemoglobin: 7 g/dL — ABNORMAL LOW (ref 12.0–15.0)
LYMPHS PCT: 16 % (ref 12–46)
Lymphs Abs: 0.8 10*3/uL (ref 0.7–4.0)
MCH: 32.3 pg (ref 26.0–34.0)
MCHC: 34.7 g/dL (ref 30.0–36.0)
MCV: 93.1 fL (ref 78.0–100.0)
Monocytes Absolute: 0.7 10*3/uL (ref 0.1–1.0)
Monocytes Relative: 15 % — ABNORMAL HIGH (ref 3–12)
NEUTROS PCT: 69 % (ref 43–77)
Neutro Abs: 3.3 10*3/uL (ref 1.7–7.7)
Platelets: 18 10*3/uL — CL (ref 150–400)
RBC: 2.17 MIL/uL — AB (ref 3.87–5.11)
RDW: 17.8 % — ABNORMAL HIGH (ref 11.5–15.5)
WBC: 4.8 10*3/uL (ref 4.0–10.5)

## 2013-10-07 LAB — URINE CULTURE

## 2013-10-07 MED ORDER — LEVOFLOXACIN IN D5W 500 MG/100ML IV SOLN
500.0000 mg | INTRAVENOUS | Status: DC
  Administered 2013-10-07 – 2013-10-08 (×2): 500 mg via INTRAVENOUS
  Filled 2013-10-07 (×2): qty 100

## 2013-10-07 MED ORDER — OXYCODONE HCL 5 MG PO TABS
5.0000 mg | ORAL_TABLET | Freq: Once | ORAL | Status: AC
  Administered 2013-10-07: 5 mg via ORAL
  Filled 2013-10-07: qty 1

## 2013-10-07 NOTE — Progress Notes (Signed)
[facility] PROGRESS NOTE  Elizabeth Humphrey LTJ:[id number] DOB: 1934-06-16 DOA: 10/04/2013 PCP: Geoffery Lyons, MD Brief narrative: Patient is a 80 year old female with significant past medical history of small cell lung carcinoma and was recently started on radiation and chemotherapy who presented with low back pain, difficulty with urination, and neutropenia.  Was diagnosed with pyelonephritis.  Assessment/Plan: UTI/pyelonophritis Urine culture growing Escherichia coli that is pansensitive to all antibiotics tested. CT report reviewed.  - Will transition to [place] today if tolerated well we'll plan on discharging on this regimen - Most likely cause of hematuria, hematuria does not resolve after IV antibiotics patient will require further evaluation   History of Small cell Cancer  Currently on chemo and radiation for palliation purposes. Oncology on board  Odynophagia/Possible Radiation induced Esophagitis  Likely a result of radiation. Will utilize magic mouthwash and continue Carafate. No new complaints today  Pancytopenia  Likely a result of chemotherapy. No active bleeding noted.  - If hemoglobin goes lower than 7 we'll plan on transfusing. - Will monitor for one more day and reassess hemoglobin levels next a.m.  Dehydration  Continue IVF.  Code Status: Full Family Communication: Discussed with patient and daughter at bedside  Disposition Plan: Will transition to oral antibiotics once urine sensitivities reported   Consultants:  Oncology  Procedures:  None  Antibiotics:  Vanco and Zosyn  HPI/Subjective: As reported some blood with urination. Otherwise no new complaints. No acute issues overnight  Objective: Filed Vitals:   10/07/13 0900  BP: 99/65  Pulse: 100  Temp: 98.2 F (36.8 C)  Resp: 22    Intake/Output Summary (Last 24 hours) at 10/07/13 1424 Last data filed at 10/07/13 1220  Gross per 24 hour  Intake   2160 ml  Output    310 ml   Net   1850 ml   Filed Weights   10/05/13 0000  Weight: 77.111 kg (170 lb)    Exam:   General:  Pt in NAD, Alert and awake  Cardiovascular: RRR, no MRG  Respiratory: CTA BL, no wheezes  Abdomen: soft, NT, ND  Musculoskeletal: no cyanosis or clubbing   Data Reviewed: Basic Metabolic Panel:  Recent Labs Lab 10/01/13 0813 10/04/13 1805 10/05/13 0500 10/06/13 0625  NA 137 138 141 146  K 3.4* 3.5* 3.9 4.0  CL  --  100 104 108  CO2 23 23 23 23   GLUCOSE 148* 132* 117* 115*  BUN 25.1 44* 40* 42*  CREATININE 0.8 0.82 0.92 0.92  CALCIUM 9.3 9.5 8.9 9.0   Liver Function Tests:  Recent Labs Lab 10/01/13 0813 10/04/13 1805 10/05/13 0500 10/06/13 0625  AST 15 11 11 10   ALT 15 10 9 10   ALKPHOS 69 60 56 59  BILITOT 0.90 0.6 0.4 0.5  PROT 7.1 7.0 6.6 6.7  ALBUMIN 3.7 3.2* 2.9* 2.9*   No results found for this basename: LIPASE, AMYLASE,  in the last 168 hours No results found for this basename: AMMONIA,  in the last 168 hours CBC:  Recent Labs Lab 10/01/13 0813 10/04/13 1805 10/05/13 0500 10/06/13 0625 10/07/13 0645  WBC 1.2* 1.9* 2.2* 2.8* 4.8  NEUTROABS 0.8* 1.0* 1.3* 1.7 3.3  HGB 9.0* 7.7* 7.1* 7.2* 7.0*  HCT 26.8* 21.7* 20.8* 21.1* 20.2*  MCV 94.0 90.8 92.0 92.1 93.1  PLT 110* 38* 33* 19* 18*   Cardiac Enzymes: No results found for this basename: CKTOTAL, CKMB, CKMBINDEX, TROPONINI,  in the last 168 hours BNP (last 3 results) No results found  for this basename: PROBNP,  in the last 8760 hours CBG: No results found for this basename: GLUCAP,  in the last 168 hours  Recent Results (from the past 240 hour(s))  URINE CULTURE     Status: None   Collection Time    10/04/13  6:37 PM      Result Value Range Status   Specimen Description URINE, CLEAN CATCH   Final   Special Requests NONE   Final   Culture  Setup Time     Final   Value: 10/05/2013 06:22     Performed at [place]     Final   Value: >=100,000 COLONIES/ML      Performed at [facility]   Culture     Final   Value: ESCHERICHIA COLI     Performed at [facility]   Report Status 10/07/2013 FINAL   Final   Organism ID, Bacteria ESCHERICHIA COLI   Final  CULTURE, BLOOD (ROUTINE X 2)     Status: None   Collection Time    10/04/13  8:15 PM      Result Value Range Status   Specimen Description BLOOD RIGHT ANTECUBITAL   Final   Special Requests BOTTLES DRAWN AEROBIC AND ANAEROBIC 4CC   Final   Culture  Setup Time     Final   Value: 10/05/2013 05:38     Performed at [facility]   Culture     Final   Value:        BLOOD CULTURE RECEIVED NO GROWTH TO DATE CULTURE WILL BE HELD FOR 5 DAYS BEFORE ISSUING A FINAL NEGATIVE REPORT     Performed at [facility]   Report Status PENDING   Incomplete  CULTURE, BLOOD (ROUTINE X 2)     Status: None   Collection Time    10/04/13  8:20 PM      Result Value Range Status   Specimen Description BLOOD RIGHT HAND   Final   Special Requests BOTTLES DRAWN AEROBIC ONLY 5CC   Final   Culture  Setup Time     Final   Value: 10/05/2013 05:38     Performed at [facility]   Culture     Final   Value:        BLOOD CULTURE RECEIVED NO GROWTH TO DATE CULTURE WILL BE HELD FOR 5 DAYS BEFORE ISSUING A FINAL NEGATIVE REPORT     Performed at [facility]   Report Status PENDING   Incomplete     Studies: No results found.  Scheduled Meds: . lactose free nutrition  237 mL Oral BID BM  . levofloxacin (LEVAQUIN) IV  500 mg Intravenous Q24H  . lidocaine-prilocaine   Topical Once  . magic mouthwash  10 mL Oral QID  . PRUTECT  1 application Topical BID  . sucralfate  1 g Oral TID WC & HS   Continuous Infusions: . 0.9 % NaCl with KCl 40 mEq / L 75 mL/hr at 10/04/13 2340    Principal Problem:   UTI (urinary tract infection) Active Problems:   COPD, moderate   COPD (chronic obstructive pulmonary disease)   Small cell lung carcinoma   Protein-calorie malnutrition,  severe    Time spent: > 35 minutes    Velvet Bathe  [facility] Pager [phone] If 7PM-7AM, please contact night-coverage at [email or web address], password [facility] 10/07/2013, 2:24 PM  LOS: 3 days

## 2013-10-07 NOTE — Telephone Encounter (Signed)
Called Percell Miller, Illiana's husband to let him know that her radiation treatments are going to be canceled today and tomorrow per Dr. Sondra Come.  Also talked to [place] on 6E, Katie, RN to let her know.

## 2013-10-07 NOTE — Progress Notes (Signed)
Pt had blood in [facility] after urination. Blood was also noted on toilet paper. No active bleeding noted on assessment of perineum. Will continue to monitor.

## 2013-10-07 NOTE — Progress Notes (Signed)
ANTIBIOTIC CONSULT NOTE   Pharmacy Consult for Levaquin Indication:  pyelonephritis  Allergies  Allergen Reactions  . Neosporin [Neomycin-Bacitracin Zn-Polymyx] Rash    Patient Measurements: Height: 5\' 7"  (170.2 cm) Weight: 170 lb (77.111 kg) IBW/kg (Calculated) : 61.6   Vital Signs: Temp: 98.2 F (36.8 C) (01/27 0900) Temp src: Oral (01/27 0900) BP: 99/65 mmHg (01/27 0900) Pulse Rate: 100 (01/27 0900) Intake/Output from previous day: 01/26 0701 - 01/27 0700 In: 3000 [P.O.:300; I.V.:2075; IV Piggyback:500] Out: 775 [Urine:775] Intake/Output from this shift: Total I/O In: 1550 [P.O.:300; I.V.:1200; IV Piggyback:50] Out: 235 [Urine:235]  Labs:  Recent Labs  10/04/13 1805 10/05/13 0500 10/06/13 0625 10/07/13 0645  WBC 1.9* 2.2* 2.8* 4.8  HGB 7.7* 7.1* 7.2* 7.0*  PLT 38* 33* 19* 18*  CREATININE 0.82 0.92 0.92  --    Estimated Creatinine Clearance: 54.8 ml/min (by C-G formula based on Cr of 0.92).    Microbiology: Recent Results (from the past 720 hour(s))  URINE CULTURE     Status: None   Collection Time    10/04/13  6:37 PM      Result Value Range Status   Specimen Description URINE, CLEAN CATCH   Final   Special Requests NONE   Final   Culture  Setup Time     Final   Value: 10/05/2013 06:22     Performed at [place]     Final   Value: >=100,000 COLONIES/ML     Performed at [facility]   Culture     Final   Value: ESCHERICHIA COLI     Performed at [facility]   Report Status 10/07/2013 FINAL   Final   Organism ID, Bacteria ESCHERICHIA COLI   Final  CULTURE, BLOOD (ROUTINE X 2)     Status: None   Collection Time    10/04/13  8:15 PM      Result Value Range Status   Specimen Description BLOOD RIGHT ANTECUBITAL   Final   Special Requests BOTTLES DRAWN AEROBIC AND ANAEROBIC 4CC   Final   Culture  Setup Time     Final   Value: 10/05/2013 05:38     Performed at [facility]   Culture     Final   Value:        BLOOD CULTURE RECEIVED NO GROWTH TO DATE CULTURE WILL BE HELD FOR 5 DAYS BEFORE ISSUING A FINAL NEGATIVE REPORT     Performed at [facility]   Report Status PENDING   Incomplete  CULTURE, BLOOD (ROUTINE X 2)     Status: None   Collection Time    10/04/13  8:20 PM      Result Value Range Status   Specimen Description BLOOD RIGHT HAND   Final   Special Requests BOTTLES DRAWN AEROBIC ONLY 5CC   Final   Culture  Setup Time     Final   Value: 10/05/2013 05:38     Performed at [facility]   Culture     Final   Value:        BLOOD CULTURE RECEIVED NO GROWTH TO DATE CULTURE WILL BE HELD FOR 5 DAYS BEFORE ISSUING A FINAL NEGATIVE REPORT     Performed at [facility]   Report Status PENDING   Incomplete    Medical History: Past Medical History  Diagnosis Date  . Allergy     bee stings/anaphylaxis  . Hyperlipidemia     hx  of  . Lung cancer   . COPD (chronic obstructive pulmonary disease) 07/30/13  . Heart murmur   . History of pneumonia   . Arthritis   . Pyelonephritis   . Cough     Medications:  Scheduled:  . lactose free nutrition  237 mL Oral BID BM  . lidocaine-prilocaine   Topical Once  . magic mouthwash  10 mL Oral QID  . PRUTECT  1 application Topical BID  . sucralfate  1 g Oral TID WC & HS   Infusions:  . 0.9 % NaCl with KCl 40 mEq / L 75 mL/hr at 10/04/13 2340   PRN: morphine injection, sodium chloride  Assessment: 80 y/o F admitted 1/24 with pyelonephritis in setting of neutropenia secondary to chemotherapy for SCLC, now on D#3 of empiric Zosyn plus vancomycin.  Urine culture grew pan-sensitive E.Coli and neutropenia has resolved.  Orders received to de-escalate regimen to monotherapy with levofloxacin.  Pharmacy assistance with dosing requested.  Goal of Therapy:  Appropriate antibiotic dosing; eradication of infection  Plan:  1. Levaquin 500 mg IV q24h 2. Follow clinical course  Clayburn Pert, PharmD, BCPS Pager:  [phone] 10/07/2013  12:29 PM

## 2013-10-07 NOTE — Progress Notes (Signed)
Each time pt urinates, blood-tinged urine is significant in bedside commode. Because this is a new issue (first noticed by nurse last night) Dr. Wendee Beavers notified. No new orders given. Will continue to monitor pt.

## 2013-10-08 ENCOUNTER — Other Ambulatory Visit: Payer: Medicare Other

## 2013-10-08 ENCOUNTER — Ambulatory Visit: Payer: Medicare Other

## 2013-10-08 DIAGNOSIS — R222 Localized swelling, mass and lump, trunk: Secondary | ICD-10-CM

## 2013-10-08 LAB — CBC WITH DIFFERENTIAL/PLATELET
Basophils Absolute: 0 10*3/uL (ref 0.0–0.1)
Basophils Relative: 0 % (ref 0–1)
Eosinophils Absolute: 0.1 10*3/uL (ref 0.0–0.7)
Eosinophils Relative: 1 % (ref 0–5)
HEMATOCRIT: 20.1 % — AB (ref 36.0–46.0)
Hemoglobin: 6.9 g/dL — CL (ref 12.0–15.0)
LYMPHS ABS: 0.7 10*3/uL (ref 0.7–4.0)
LYMPHS PCT: 13 % (ref 12–46)
MCH: 32.1 pg (ref 26.0–34.0)
MCHC: 34.3 g/dL (ref 30.0–36.0)
MCV: 93.5 fL (ref 78.0–100.0)
MONOS PCT: 13 % — AB (ref 3–12)
Monocytes Absolute: 0.7 10*3/uL (ref 0.1–1.0)
NEUTROS ABS: 4.2 10*3/uL (ref 1.7–7.7)
Neutrophils Relative %: 73 % (ref 43–77)
Platelets: 22 10*3/uL — CL (ref 150–400)
RBC: 2.15 MIL/uL — AB (ref 3.87–5.11)
RDW: 17.7 % — ABNORMAL HIGH (ref 11.5–15.5)
WBC: 5.7 10*3/uL (ref 4.0–10.5)

## 2013-10-08 LAB — PREPARE RBC (CROSSMATCH)

## 2013-10-08 LAB — HEMOGLOBIN AND HEMATOCRIT, BLOOD
HEMATOCRIT: 23.2 % — AB (ref 36.0–46.0)
HEMOGLOBIN: 8.1 g/dL — AB (ref 12.0–15.0)

## 2013-10-08 MED ORDER — HEPARIN SOD (PORK) LOCK FLUSH 100 UNIT/ML IV SOLN
500.0000 [IU] | Freq: Once | INTRAVENOUS | Status: AC
  Administered 2013-10-08: 500 [IU] via INTRAVENOUS
  Filled 2013-10-08: qty 5

## 2013-10-08 MED ORDER — LEVOFLOXACIN 500 MG PO TABS: 500.0000 mg | ORAL_TABLET | Freq: Every day | ORAL | Status: DC

## 2013-10-08 MED ORDER — BOOST PLUS PO LIQD: 237.0000 mL | Freq: Two times a day (BID) | ORAL | Status: DC

## 2013-10-08 NOTE — Progress Notes (Signed)
Pt Port is due to be changed, pt refused to let RN change it, she anticipates going home today and states Dr Earlie Server did not give her the "ok" and will like to talk to him first. Rn explained the need to get the port changed. Will let A.M. nurse know.

## 2013-10-08 NOTE — Care Management Note (Addendum)
    Page 1 of 2   10/08/2013     3:22:33 PM   CARE MANAGEMENT NOTE 10/08/2013  Patient:  Elizabeth Humphrey, Elizabeth Humphrey   Account Number:  [id number]  Date Initiated:  10/08/2013  Documentation initiated by:  Sunday Spillers  Subjective/Objective Assessment:   80 yo female admitted with acute pyelonephritis. PTA lived at home with spouse.     Action/Plan:   Home when stable   Anticipated DC Date:  10/08/2013   Anticipated DC Plan:  [place]  CM consult      [facility] Choice  HOME HEALTH   Choice offered to / List presented to:  C-1 Patient        [facility] arranged  [place].   Status of service:  Completed, signed off Medicare Important Message given?   (If response is "NO", the following Medicare IM given date fields will be blank) Date Medicare IM given:   Date Additional Medicare IM given:    Discharge Disposition:  [place]  Per UR Regulation:  Reviewed for med. necessity/level of care/duration of stay  If discussed at [place] of Stay Meetings, dates discussed:    Comments:  10-08-13 Sunday Spillers RN CM 1000 Spoke with patient at bedside, states she has no preference for [facility] agency but is agreeable to PT. She agreed for my to contact [facility] to arrange, contacted [facility] and they will follow for d/c needs. Awaiting final orders.

## 2013-10-08 NOTE — Discharge Summary (Addendum)
Physician Discharge Summary  Elizabeth Humphrey CHE:[id number] DOB: 1934/01/07 DOA: 10/04/2013  PCP: Geoffery Lyons, MD  Admit date: 10/04/2013 Discharge date: 10/08/2013  Time spent: 40 minutes  Recommendations for Outpatient Follow-up:   UTI/pyelonophritis  Urine culture growing Escherichia coli that is pansensitive to all antibiotics tested. CT report reviewed.  - Discharge on Levaquin for total of 5 day course of treatment   - Most likely cause of hematuria, hematuria does not resolve after IV antibiotics completed patient will require further evaluation. Followup with PCP   History of Small cell Cancer  Currently on chemo and radiation for palliation purposes.  Oncology on board   COPD -Stable  Odynophagia/Possible Radiation induced Esophagitis  - Resolved, however we will continue Carafate since patient will have future XRT treatment.     Pancytopenia  Likely a result of chemotherapy. No active bleeding noted.  -Hemoglobin on discharge = 8.1   -Counseled patient to be cognizant of blood in stool or urine and report to PCP or oncologist ASAP   Malnutrition severe -Discharge with boost 1 can between meals BID     Discharge Diagnoses:  Principal Problem:   Acute pyelonephritis Active Problems:   COPD, moderate   COPD (chronic obstructive pulmonary disease)   Small cell lung carcinoma   UTI (urinary tract infection)   Protein-calorie malnutrition, severe   Discharge Condition: Stable  Diet recommendation: Regular  Filed Weights   10/05/13 0000  Weight: 77.111 kg (170 lb)    History of present illness:  80 y.o. WF PMHX COPD, protein calorie malnutrition severe, lung mass, recent diagnosis of small cell lung ca 07/2013 recently started on radiation and chemotherapy presenting with low back, difficulty with urination, and neutropenia. Per the daughter, pt was started on chemotherapy within the past 4-5 weeks. Also started on radiation 3-4 weeks ago. Last  radiation tx was 2 days ago. Per the daughter, pt has had issue with urinary incontinence of the past 3 weeks, which is a new problem. Pt has had new low back and abdominal pain over the past 2-3 days. No fevers, chills, nausea or vomiting. Daughter also reports pt with treatment by PCP of ? GU/vaginal area soft tissue infection. Daughter unclear of specific tx.  Sxs have seemed to progressively worsen and pt was brought to the ER.  In the ER, UA was indicative of infection. Pt also with noted WBC @ 1.9, Hgb @ 7.7, plts @38 , K @ 3.5. Afebrile on presentation. 1/28 patient received 1 unit PRBC today, patient states greatly improved, has not noticed any blood in her urine or stool, request to be discharged.   Procedures: CT abdomen pelvis without contrast 10/05/2013 No evidence of urolithiasis or hydronephrosis.  Mild diffuse bladder wall thickening, which is nonspecific and may  be due to cystitis or neurogenic bladder. Suggest correlation with  urinalysis.  Tiny less than 1 cm calcified uterine fibroid  Acute abdominal series 10/04/2013 1. No evidence for bowel obstruction.  2. No acute cardiopulmonary abnormalities.    Consultations:   Antibiotics Levofloxacin 1/27>>   Discharge Exam: Filed Vitals:   10/07/13 2045 10/08/13 0507 10/08/13 0821 10/08/13 0840  BP: 121/52 92/50 107/38 110/45  Pulse: 88 116 96 98  Temp: 98.3 F (36.8 C) 99.5 F (37.5 C) 97.8 F (36.6 C) 98.1 F (36.7 C)  TempSrc: Oral Oral Oral Oral  Resp: 20 18 18 18   Height:      Weight:      SpO2: 100% 99%  General: A./O. x4, NAD Cardiovascular: Irregular rhythm and rate, positive systolic murmur 3/5, negative rubs or gallops, DP/PT pulse one plus bilateral Respiratory: Clear to auscultation bilateral  Discharge Instructions   Future Appointments Provider Department Dept Phone   10/09/2013 8:40 AM [facility] [phone]   10/10/2013 8:40 AM  [facility] [phone]   10/13/2013 8:40 AM [facility] [phone]   10/15/2013 12:25 PM [facility] [phone]   10/15/2013 1:30 PM [facility] [phone]   10/15/2013 2:00 PM Curt Bears, MD [facility] [phone]   10/15/2013 2:30 PM [facility] [phone]   10/16/2013 9:30 AM [facility] [phone]   10/16/2013 10:30 AM Karie Mainland, [facility] [phone]   10/17/2013 9:30 AM [facility] [phone]   10/18/2013 9:30 AM [facility] [phone]   10/20/2013 5:15 PM [facility] [phone]   10/21/2013 3:00 PM [facility] [phone]       Medication List    ASK your doctor about these medications       acetaminophen 325 MG tablet  Commonly known as:  TYLENOL  Take 650 mg by mouth every 6 (six) hours as needed (pain).     aspirin 81 MG tablet  Take 81 mg by mouth daily.     emollient cream  Commonly known as:  BIAFINE  Apply 1 application topically 2 (two) times daily. Apply to affected skin area once skin becomes irriatated or red,itching, after rad txs then daily and bedtime     HYDROcodone-acetaminophen 7.5-325 mg/15 ml solution  Commonly known as:  HYCET  Take 10 mLs by mouth 4 (four) times daily as needed for moderate pain.     lidocaine-prilocaine cream  Commonly known as:  EMLA  Apply to Port-A-Cath one to 2 hours prior to chemotherapy as directed     mupirocin ointment 2 %  Commonly known as:   BACTROBAN  Apply 1 application topically as needed. For cuts and scrapes     polyethylene glycol packet  Commonly known as:  MIRALAX / GLYCOLAX  Take 17 g by mouth daily.     prochlorperazine 10 MG tablet  Commonly known as:  COMPAZINE  Take 1 tablet (10 mg total) by mouth every 6 (six) hours as needed for nausea or vomiting.     simvastatin 40 MG tablet  Commonly known as:  ZOCOR  Take 40 mg by mouth at bedtime.     sucralfate 1 G tablet  Commonly known as:  CARAFATE  Take 1 tablet (1 g total) by mouth 4 (four) times daily -  with meals and at bedtime.     traMADol 50 MG tablet  Commonly known as:  ULTRAM  Take 1 tablet by mouth every 6 (six) hours as needed (pain).       Allergies  Allergen Reactions  . Neosporin [Neomycin-Bacitracin Zn-Polymyx] Rash      The results of significant diagnostics from this hospitalization (including imaging, microbiology, ancillary and laboratory) are listed below for reference.    Significant Diagnostic Studies: Ct Abdomen Pelvis  Wo Contrast  10/05/2013   CLINICAL DATA:  Right flank pain and lower abdominal pain currently undergoing chemotherapy and radiation therapy for lung carcinoma.  EXAM: CT ABDOMEN AND PELVIS WITHOUT CONTRAST  TECHNIQUE: Multidetector CT imaging of the abdomen and pelvis was performed following the standard protocol without intravenous contrast.  COMPARISON:  None.  FINDINGS: No evidence of renal calculi or hydronephrosis. No evidence of ureteral calculi or dilatation. No bladder calculi identified. Mild diffuse bladder wall thickening is seen, which is nonspecific and may be seen with cystitis or neurogenic bladder.  A tiny 1 cm subserosal fibroid is seen in the posterior uterus. Adnexal regions are unremarkable. Noncontrast images of the liver, gallbladder, pancreas, spleen, and adrenal glands show no significant abnormalities. No masses or lymphadenopathy identified. No evidence of abscess or free fluid. unopacified bowel  loops are unremarkable in appearance. No evidence of bowel obstruction.  IMPRESSION: No evidence of urolithiasis or hydronephrosis.  Mild diffuse bladder wall thickening, which is nonspecific and may be due to cystitis or neurogenic bladder. Suggest correlation with urinalysis.  Tiny less than 1 cm calcified uterine fibroid.   Electronically Signed   By: Earle Gell M.D.   On: 10/05/2013 09:44   Ct Chest W Contrast  09/19/2013   CLINICAL DATA:  Follow-up lung cancer, diagnosed 07/2013, chemotherapy/XRT ongoing.  EXAM: CT CHEST WITH CONTRAST  TECHNIQUE: Multidetector CT imaging of the chest was performed during intravenous contrast administration.  CONTRAST:  44mL OMNIPAQUE IOHEXOL 300 MG/ML  SOLN  COMPARISON:  PET-CT dated 07/24/2013.  FINDINGS: 1.8 x 1.4 cm right middle lobe nodule (series 5/image 33), corresponding to known primary bronchogenic neoplasm, previously 2.8 x 3.8 cm. Associated surrounding radiation changes.  5 mm medial left upper lobe nodule, hypermetabolic on PET, unchanged.  Biapical pleural parenchymal scarring, right greater than left. Additional mild nodularity/scarring in the posterior right upper lobe (series 5/ images 12, 13, and 16), unchanged. Additional mild nodularity in the lingula and left lower lobe (series 5/image 31, 40, and 42), unchanged. No pleural effusion or pneumothorax.  Visualized thyroid is unremarkable.  The heart is normal in size. No pericardial effusion. Coronary atherosclerosis. Atherosclerotic calcifications of the aortic arch.  Right chest port terminating at the cavoatrial junction.  Improving thoracic lymphadenopathy, including:  --8 mm short axis right hilar node (series 2/ image 24), previously 16 mm  --14 mm short axis subcarinal node (series 2/ image 26), previously 26 mm  --8 mm short axis infrahilar/right lower lobe node (series 2/ image 29), previously 18 mm  Visualized upper abdomen is grossly unremarkable, noting a 10 mm hypoenhancing lesion in the  lateral spleen (series 2/ image 54), likely benign.  Degenerative changes of the visualized thoracolumbar spine.  IMPRESSION: 1.8 cm right middle lobe nodule, corresponding to known primary bronchogenic neoplasm, decreased. Surrounding radiation changes.  5 mm medial left upper lobe nodule, hypermetabolic on PET, unchanged.  Improving thoracic lymphadenopathy, as described above.   Electronically Signed   By: Julian Hy M.D.   On: 09/19/2013 14:06   Dg Abd Acute W/chest  10/04/2013   CLINICAL DATA:  Body aches and weakness.  History of lung cancer  EXAM: ACUTE ABDOMEN SERIES (ABDOMEN 2 VIEW & CHEST 1 VIEW)  COMPARISON:  None.  FINDINGS: No dilated loops of small bowel identified. Gaseous distension of the proximal large bowel loops noted. There is gas and stool noted within the colon up to the rectum. No evidence for free intraperitoneal air.  Right chest wall Port-A-Cath there is  noted with tip in the distal SVC. There is mild cardiac enlargement. Calcified atherosclerotic disease involves the thoracic aorta. Mild diffuse interstitial coarsening is noted bilaterally.  IMPRESSION: 1. No evidence for bowel obstruction. 2. No acute cardiopulmonary abnormalities.   Electronically Signed   By: Kerby Moors M.D.   On: 10/04/2013 18:39    Microbiology: Recent Results (from the past 240 hour(s))  URINE CULTURE     Status: None   Collection Time    10/04/13  6:37 PM      Result Value Range Status   Specimen Description URINE, CLEAN CATCH   Final   Special Requests NONE   Final   Culture  Setup Time     Final   Value: 10/05/2013 06:22     Performed at [facility] Count     Final   Value: >=100,000 COLONIES/ML     Performed at [facility]   Culture     Final   Value: ESCHERICHIA COLI     Performed at [facility]   Report Status 10/07/2013 FINAL   Final   Organism ID, Bacteria ESCHERICHIA COLI   Final  CULTURE, BLOOD (ROUTINE X 2)     Status: None    Collection Time    10/04/13  8:15 PM      Result Value Range Status   Specimen Description BLOOD RIGHT ANTECUBITAL   Final   Special Requests BOTTLES DRAWN AEROBIC AND ANAEROBIC 4CC   Final   Culture  Setup Time     Final   Value: 10/05/2013 05:38     Performed at [facility]   Culture     Final   Value:        BLOOD CULTURE RECEIVED NO GROWTH TO DATE CULTURE WILL BE HELD FOR 5 DAYS BEFORE ISSUING A FINAL NEGATIVE REPORT     Performed at [facility]   Report Status PENDING   Incomplete  CULTURE, BLOOD (ROUTINE X 2)     Status: None   Collection Time    10/04/13  8:20 PM      Result Value Range Status   Specimen Description BLOOD RIGHT HAND   Final   Special Requests BOTTLES DRAWN AEROBIC ONLY 5CC   Final   Culture  Setup Time     Final   Value: 10/05/2013 05:38     Performed at [facility]   Culture     Final   Value:        BLOOD CULTURE RECEIVED NO GROWTH TO DATE CULTURE WILL BE HELD FOR 5 DAYS BEFORE ISSUING A FINAL NEGATIVE REPORT     Performed at [facility]   Report Status PENDING   Incomplete     Labs: Basic Metabolic Panel:  Recent Labs Lab 10/04/13 1805 10/05/13 0500 10/06/13 0625  NA 138 141 146  K 3.5* 3.9 4.0  CL 100 104 108  CO2 23 23 23   GLUCOSE 132* 117* 115*  BUN 44* 40* 42*  CREATININE 0.82 0.92 0.92  CALCIUM 9.5 8.9 9.0   Liver Function Tests:  Recent Labs Lab 10/04/13 1805 10/05/13 0500 10/06/13 0625  AST 11 11 10   ALT 10 9 10   ALKPHOS 60 56 59  BILITOT 0.6 0.4 0.5  PROT 7.0 6.6 6.7  ALBUMIN 3.2* 2.9* 2.9*   No results found for this basename: LIPASE, AMYLASE,  in the last 168 hours No results found for this basename: AMMONIA,  in  the last 168 hours CBC:  Recent Labs Lab 10/04/13 1805 10/05/13 0500 10/06/13 0625 10/07/13 0645 10/08/13 0549  WBC 1.9* 2.2* 2.8* 4.8 5.7  NEUTROABS 1.0* 1.3* 1.7 3.3 4.2  HGB 7.7* 7.1* 7.2* 7.0* 6.9*  HCT 21.7* 20.8* 21.1* 20.2* 20.1*  MCV 90.8 92.0 92.1 93.1  93.5  PLT 38* 33* 19* 18* 22*   Cardiac Enzymes: No results found for this basename: CKTOTAL, CKMB, CKMBINDEX, TROPONINI,  in the last 168 hours BNP: BNP (last 3 results) No results found for this basename: PROBNP,  in the last 8760 hours CBG: No results found for this basename: GLUCAP,  in the last 168 hours     Signed:  Dia Crawford, MD [facility] [phone] pager

## 2013-10-09 ENCOUNTER — Ambulatory Visit: Payer: Medicare Other

## 2013-10-09 ENCOUNTER — Telehealth: Payer: Self-pay | Admitting: Oncology

## 2013-10-09 LAB — TYPE AND SCREEN
ABO/RH(D): O NEG
Antibody Screen: NEGATIVE
Unit division: 0

## 2013-10-09 NOTE — Telephone Encounter (Signed)
Called Elizabeth Humphrey to let her know that Dr. Sondra Come has canceled her radiation treatments for today and tomorrow.  Ms. Delfino verbalized agreement.  Notified Faith on linac 4.

## 2013-10-10 ENCOUNTER — Ambulatory Visit: Admission: RE | Admit: 2013-10-10 | Payer: Medicare Other | Source: Ambulatory Visit

## 2013-10-11 LAB — CULTURE, BLOOD (ROUTINE X 2)
CULTURE: NO GROWTH
Culture: NO GROWTH

## 2013-10-13 ENCOUNTER — Ambulatory Visit
Admission: RE | Admit: 2013-10-13 | Discharge: 2013-10-13 | Disposition: A | Discharge status: Home / Self Care | Payer: Medicare Other | Source: Ambulatory Visit | Attending: Radiation Oncology | Admitting: Radiation Oncology

## 2013-10-13 DIAGNOSIS — R5381 Other malaise: Secondary | ICD-10-CM | POA: Diagnosis not present

## 2013-10-13 DIAGNOSIS — Z79899 Other long term (current) drug therapy: Secondary | ICD-10-CM | POA: Diagnosis not present

## 2013-10-13 DIAGNOSIS — Z510 Encounter for antineoplastic radiation therapy: Secondary | ICD-10-CM | POA: Diagnosis not present

## 2013-10-13 DIAGNOSIS — L589 Radiodermatitis, unspecified: Secondary | ICD-10-CM | POA: Diagnosis not present

## 2013-10-13 DIAGNOSIS — K209 Esophagitis, unspecified without bleeding: Secondary | ICD-10-CM | POA: Diagnosis not present

## 2013-10-13 DIAGNOSIS — J040 Acute laryngitis: Secondary | ICD-10-CM | POA: Diagnosis not present

## 2013-10-13 DIAGNOSIS — Z7982 Long term (current) use of aspirin: Secondary | ICD-10-CM | POA: Diagnosis not present

## 2013-10-13 DIAGNOSIS — C342 Malignant neoplasm of middle lobe, bronchus or lung: Secondary | ICD-10-CM | POA: Diagnosis not present

## 2013-10-13 DIAGNOSIS — C3490 Malignant neoplasm of unspecified part of unspecified bronchus or lung: Secondary | ICD-10-CM | POA: Diagnosis not present

## 2013-10-13 DIAGNOSIS — R059 Cough, unspecified: Secondary | ICD-10-CM | POA: Diagnosis not present

## 2013-10-14 ENCOUNTER — Ambulatory Visit
Admission: RE | Admit: 2013-10-14 | Discharge: 2013-10-14 | Disposition: A | Discharge status: Home / Self Care | Payer: Medicare Other | Source: Ambulatory Visit | Attending: Radiation Oncology | Admitting: Radiation Oncology

## 2013-10-14 VITALS — BP 97/47 | HR 98 | Temp 97.4°F | Ht 67.0 in | Wt 165.7 lb

## 2013-10-14 DIAGNOSIS — C3490 Malignant neoplasm of unspecified part of unspecified bronchus or lung: Secondary | ICD-10-CM

## 2013-10-14 DIAGNOSIS — C342 Malignant neoplasm of middle lobe, bronchus or lung: Secondary | ICD-10-CM | POA: Diagnosis not present

## 2013-10-14 MED ORDER — MAGIC MOUTHWASH: 10.0000 mL | Freq: Four times a day (QID) | ORAL | Status: DC

## 2013-10-14 MED ORDER — HYDROCODONE-ACETAMINOPHEN 7.5-325 MG/15ML PO SOLN: 10.0000 mL | Freq: Four times a day (QID) | ORAL | Status: DC | PRN

## 2013-10-14 NOTE — Progress Notes (Addendum)
Elizabeth Humphrey has had 29 fractions to her right lung.  She reports a sore throat especially when she eats.  She is only able to drink liquids and eat soft foods.  She is currently taking carafate but is wondering if she can get a prescription for magic mouthwash.  She reports a productive cough which made her vomit today.  She has lost 5 lbs since 10/05/12.  She is seeing the nutritionist.  The skin on her upper back and her chest is peeling.  She is using biafine cream.  She reports fatigue.  She reports that she is still having incontinence but it is better.

## 2013-10-14 NOTE — Progress Notes (Signed)
[facility]     Rexene Edison, M.D. [place], [place] [address]               Blair Promise, M.D., Ph.D. Phone: [phone]      [place] A. Tammi Klippel, M.D. Fax: [phone]      Jodelle Gross, M.D., Ph.D.         Thea Silversmith, M.D.         Wyvonnia Lora, M.D Weekly Treatment Management Note  Name: Elizabeth Humphrey     MRN: [id number]        CSN: [id number] Date: 10/14/2013      DOB: December 25, 1933  CC: Geoffery Lyons, MD         Reynaldo Minium    Status: Outpatient  Diagnosis: The encounter diagnosis was Small cell lung carcinoma.  Current Dose: 52.2 Gy  Current Fraction: 29  Planned Dose: 59.4 Gy  Narrative: Sheryll D Klippel was seen today for weekly treatment management. The chart was checked and MVCT  were reviewed. She is happy to beout of hospital but continues to have a lot of fatigue. She also has esophageal symptoms. I refilled her hydrocodone and have placed on Magic mouthwash.  She denies any dizziness with standing  Review of patient's allergies indicates no active allergies. Current Outpatient Prescriptions  Medication Sig Dispense Refill  . acetaminophen (TYLENOL) 325 MG tablet Take 650 mg by mouth every 6 (six) hours as needed (pain).      [name] aspirin 81 MG tablet Take 81 mg by mouth daily.      [name] emollient (BIAFINE) cream Apply 1 application topically 2 (two) times daily. Apply to affected skin area once skin becomes irriatated or red,itching, after rad txs then daily and bedtime      . lactose free nutrition (BOOST PLUS) LIQD Take 237 mLs by mouth 2 (two) times daily between meals.  120 Can  3  . lidocaine-prilocaine (EMLA) cream Apply to Port-A-Cath one to 2 hours prior to chemotherapy as directed  30 g  1  . mupirocin ointment (BACTROBAN) 2 % Apply 1 application topically as needed. For cuts and scrapes      . polyethylene glycol (MIRALAX / GLYCOLAX) packet Take 17 g by mouth daily.      . prochlorperazine  (COMPAZINE) 10 MG tablet Take 1 tablet (10 mg total) by mouth every 6 (six) hours as needed for nausea or vomiting.  60 tablet  0  . simvastatin (ZOCOR) 40 MG tablet Take 40 mg by mouth at bedtime.      . sucralfate (CARAFATE) 1 G tablet Take 1 tablet (1 g total) by mouth 4 (four) times daily -  with meals and at bedtime.  120 tablet  1  . traMADol (ULTRAM) 50 MG tablet Take 1 tablet by mouth every 6 (six) hours as needed (pain).       . Alum & Mag Hydroxide-Simeth (MAGIC MOUTHWASH) SOLN Take 10 mLs by mouth 4 (four) times daily.  240 mL  2  . HYDROcodone-acetaminophen (HYCET) 7.5-325 mg/15 ml solution Take 10 mLs by mouth 4 (four) times daily as needed for moderate pain.  120 mL  0  . levofloxacin (LEVAQUIN) 500 MG tablet Take 1 tablet (500 mg total) by mouth daily.  4 tablet  0   No current facility-administered medications for this encounter.   Labs:  Lab Results  Component Value Date   WBC 5.7 10/08/2013  HGB 8.1* 10/08/2013   HCT 23.2* 10/08/2013   MCV 93.5 10/08/2013   PLT 22* 10/08/2013   Lab Results  Component Value Date   CREATININE 0.92 10/06/2013   BUN 42* 10/06/2013   NA 146 10/06/2013   K 4.0 10/06/2013   CL 108 10/06/2013   CO2 23 10/06/2013   Lab Results  Component Value Date   ALT 10 10/06/2013   AST 10 10/06/2013   BILITOT 0.5 10/06/2013    Physical Examination:  Filed Vitals:   10/14/13 1512  BP: 97/47  Pulse: 98  Temp: 97.4 F (36.3 C)    Wt Readings from Last 3 Encounters:  10/14/13 165 lb 11.2 oz (75.161 kg)  10/05/13 170 lb (77.111 kg)  09/30/13 174 lb 8 oz (79.153 kg)    The oral cavity is moist without secondary infection. Lungs - Normal respiratory effort, chest expands symmetrically. Lungs are clear to auscultation, no crackles or wheezes.  Heart has regular rhythm and rate  Abdomen is soft and non tender with normal bowel sounds  Assessment:  Patient tolerating treatments well except for above issues  Plan: Continue treatment per original  radiation prescription.  4 more treatments left.

## 2013-10-15 ENCOUNTER — Telehealth: Payer: Self-pay | Admitting: Internal Medicine

## 2013-10-15 ENCOUNTER — Ambulatory Visit (HOSPITAL_BASED_OUTPATIENT_CLINIC_OR_DEPARTMENT_OTHER): Payer: Medicare Other

## 2013-10-15 ENCOUNTER — Ambulatory Visit
Admission: RE | Admit: 2013-10-15 | Discharge: 2013-10-15 | Disposition: A | Discharge status: Home / Self Care | Payer: Medicare Other | Source: Ambulatory Visit | Attending: Radiation Oncology | Admitting: Radiation Oncology

## 2013-10-15 ENCOUNTER — Other Ambulatory Visit (HOSPITAL_BASED_OUTPATIENT_CLINIC_OR_DEPARTMENT_OTHER): Payer: Medicare Other

## 2013-10-15 ENCOUNTER — Encounter: Payer: Self-pay | Admitting: Internal Medicine

## 2013-10-15 ENCOUNTER — Ambulatory Visit (HOSPITAL_BASED_OUTPATIENT_CLINIC_OR_DEPARTMENT_OTHER): Payer: Medicare Other | Admitting: Internal Medicine

## 2013-10-15 VITALS — BP 93/60 | HR 97 | Temp 97.7°F | Resp 18 | Ht 67.0 in | Wt 165.3 lb

## 2013-10-15 DIAGNOSIS — D649 Anemia, unspecified: Secondary | ICD-10-CM

## 2013-10-15 DIAGNOSIS — L658 Other specified nonscarring hair loss: Secondary | ICD-10-CM | POA: Diagnosis not present

## 2013-10-15 DIAGNOSIS — R5381 Other malaise: Secondary | ICD-10-CM | POA: Diagnosis not present

## 2013-10-15 DIAGNOSIS — R070 Pain in throat: Secondary | ICD-10-CM | POA: Diagnosis not present

## 2013-10-15 DIAGNOSIS — C342 Malignant neoplasm of middle lobe, bronchus or lung: Secondary | ICD-10-CM

## 2013-10-15 DIAGNOSIS — C3491 Malignant neoplasm of unspecified part of right bronchus or lung: Secondary | ICD-10-CM

## 2013-10-15 DIAGNOSIS — C3490 Malignant neoplasm of unspecified part of unspecified bronchus or lung: Secondary | ICD-10-CM

## 2013-10-15 DIAGNOSIS — Z5111 Encounter for antineoplastic chemotherapy: Secondary | ICD-10-CM

## 2013-10-15 DIAGNOSIS — R1310 Dysphagia, unspecified: Secondary | ICD-10-CM

## 2013-10-15 DIAGNOSIS — R5383 Other fatigue: Secondary | ICD-10-CM

## 2013-10-15 LAB — CBC WITH DIFFERENTIAL/PLATELET
BASO%: 0.4 % (ref 0.0–2.0)
BASOS ABS: 0 10*3/uL (ref 0.0–0.1)
EOS ABS: 0 10*3/uL (ref 0.0–0.5)
EOS%: 0.1 % (ref 0.0–7.0)
HCT: 26.6 % — ABNORMAL LOW (ref 34.8–46.6)
HGB: 8.9 g/dL — ABNORMAL LOW (ref 11.6–15.9)
LYMPH%: 13.5 % — AB (ref 14.0–49.7)
MCH: 31.8 pg (ref 25.1–34.0)
MCHC: 33.4 g/dL (ref 31.5–36.0)
MCV: 95.2 fL (ref 79.5–101.0)
MONO#: 0.8 10*3/uL (ref 0.1–0.9)
MONO%: 14.2 % — AB (ref 0.0–14.0)
NEUT%: 71.8 % (ref 38.4–76.8)
NEUTROS ABS: 4.1 10*3/uL (ref 1.5–6.5)
PLATELETS: 100 10*3/uL — AB (ref 145–400)
RBC: 2.79 10*6/uL — ABNORMAL LOW (ref 3.70–5.45)
RDW: 18.8 % — ABNORMAL HIGH (ref 11.2–14.5)
WBC: 5.8 10*3/uL (ref 3.9–10.3)
lymph#: 0.8 10*3/uL — ABNORMAL LOW (ref 0.9–3.3)

## 2013-10-15 LAB — COMPREHENSIVE METABOLIC PANEL (CC13)
ALBUMIN: 3.3 g/dL — AB (ref 3.5–5.0)
ALK PHOS: 59 U/L (ref 40–150)
ALT: 17 U/L (ref 0–55)
AST: 20 U/L (ref 5–34)
Anion Gap: 11 mEq/L (ref 3–11)
BILIRUBIN TOTAL: 0.56 mg/dL (ref 0.20–1.20)
BUN: 26.5 mg/dL — AB (ref 7.0–26.0)
CALCIUM: 9.4 mg/dL (ref 8.4–10.4)
CO2: 26 mEq/L (ref 22–29)
CREATININE: 0.8 mg/dL (ref 0.6–1.1)
Chloride: 103 mEq/L (ref 98–109)
GLUCOSE: 124 mg/dL (ref 70–140)
Potassium: 3.2 mEq/L — ABNORMAL LOW (ref 3.5–5.1)
Sodium: 140 mEq/L (ref 136–145)
Total Protein: 6.5 g/dL (ref 6.4–8.3)

## 2013-10-15 MED ORDER — SODIUM CHLORIDE 0.9 % IV SOLN
Freq: Once | INTRAVENOUS | Status: AC
  Administered 2013-10-15: 16:00:00 via INTRAVENOUS

## 2013-10-15 MED ORDER — DEXAMETHASONE SODIUM PHOSPHATE 20 MG/5ML IJ SOLN
20.0000 mg | Freq: Once | INTRAMUSCULAR | Status: AC
  Administered 2013-10-15: 20 mg via INTRAVENOUS

## 2013-10-15 MED ORDER — SODIUM CHLORIDE 0.9 % IJ SOLN
10.0000 mL | INTRAMUSCULAR | Status: DC | PRN
  Administered 2013-10-15: 10 mL
  Filled 2013-10-15: qty 10

## 2013-10-15 MED ORDER — DEXAMETHASONE SODIUM PHOSPHATE 20 MG/5ML IJ SOLN
INTRAMUSCULAR | Status: AC
Start: 2013-10-15 — End: 2013-10-15
  Filled 2013-10-15: qty 5

## 2013-10-15 MED ORDER — LIDOCAINE-PRILOCAINE 2.5-2.5 % EX CREA
TOPICAL_CREAM | CUTANEOUS | Status: AC
  Filled 2013-10-15: qty 5

## 2013-10-15 MED ORDER — ONDANSETRON 16 MG/50ML IVPB (CHCC)
16.0000 mg | Freq: Once | INTRAVENOUS | Status: AC
  Administered 2013-10-15: 16 mg via INTRAVENOUS

## 2013-10-15 MED ORDER — HEPARIN SOD (PORK) LOCK FLUSH 100 UNIT/ML IV SOLN
500.0000 [IU] | Freq: Once | INTRAVENOUS | Status: AC | PRN
  Administered 2013-10-15: 500 [IU]
  Filled 2013-10-15: qty 5

## 2013-10-15 MED ORDER — ONDANSETRON 16 MG/50ML IVPB (CHCC)
INTRAVENOUS | Status: AC
  Filled 2013-10-15: qty 16

## 2013-10-15 MED ORDER — SODIUM CHLORIDE 0.9 % IV SOLN
100.0000 mg/m2 | Freq: Once | INTRAVENOUS | Status: AC
  Administered 2013-10-15: 200 mg via INTRAVENOUS
  Filled 2013-10-15: qty 10

## 2013-10-15 MED ORDER — SODIUM CHLORIDE 0.9 % IV SOLN
395.1000 mg | Freq: Once | INTRAVENOUS | Status: AC
  Administered 2013-10-15: 400 mg via INTRAVENOUS
  Filled 2013-10-15: qty 40

## 2013-10-15 NOTE — Patient Instructions (Signed)
[facility] Discharge Instructions for Patients Receiving Chemotherapy  Today you received the following chemotherapy agents: Carboplatin, Etoposide.  To help prevent nausea and vomiting after your treatment, we encourage you to take your nausea medication as prescribed by your physician.   If you develop nausea and vomiting that is not controlled by your nausea medication, call the clinic.   BELOW ARE SYMPTOMS THAT SHOULD BE REPORTED IMMEDIATELY:  *FEVER GREATER THAN 100.5 F  *CHILLS WITH OR WITHOUT FEVER  NAUSEA AND VOMITING THAT IS NOT CONTROLLED WITH YOUR NAUSEA MEDICATION  *UNUSUAL SHORTNESS OF BREATH  *UNUSUAL BRUISING OR BLEEDING  TENDERNESS IN MOUTH AND THROAT WITH OR WITHOUT PRESENCE OF ULCERS  *URINARY PROBLEMS  *BOWEL PROBLEMS  UNUSUAL RASH Items with * indicate a potential emergency and should be followed up as soon as possible.  Feel free to call the clinic you have any questions or concerns. The clinic phone number is [phone].

## 2013-10-15 NOTE — Progress Notes (Signed)
Okay to tx with platelets-100 per Dr. Julien Nordmann.

## 2013-10-15 NOTE — Telephone Encounter (Signed)
gv pt appt schedule for feb/march. [facility] will call w/ct appt - pt aware.

## 2013-10-15 NOTE — Progress Notes (Signed)
[facility]  [phone]) [phone] [phone]  OFFICE PROGRESS NOTE   Geoffery Lyons, MD [place], [place]. [place] [address]  DIAGNOSIS: Small cell lung carcinoma, limited stage   Primary site: Lung (Right)   Staging method: AJCC 7th Edition   Clinical: Stage IIIA (T2a, N2, M0) signed by Curt Bears, MD on 08/06/2013  3:32 PM   Summary: Stage IIIA (T2a, N2, M0)  PRIOR THERAPY: None  CURRENT THERAPY: Systemic chemotherapy with carboplatin for an AUC of 5 given on day 1 and etoposide 120 mg per meter squared on days 1, 2 and 3 with Neulasta support given on day 4. This given concurrent with radiation. Status post 3 cycles  DISEASE STAGE: Small cell lung carcinoma, limited stage   Primary site: Lung (Right)   Staging method: AJCC 7th Edition   Clinical: Stage IIIA (T2a, N2, M0) signed by Curt Bears, MD on 08/06/2013  3:32 PM   Summary: Stage IIIA (T2a, N2, M0)  CHEMOTHERAPY INTENT: Palliative  CURRENT # OF CHEMOTHERAPY CYCLES: 4  CURRENT ANTIEMETICS: Zofran, dexamethasone and Compazine  CURRENT SMOKING STATUS: Former smoker, quit 09/12/1991  ORAL CHEMOTHERAPY AND CONSENT: N/A  CURRENT BISPHOSPHONATES USE: None  PAIN MANAGEMENT: Hydrocodone and acetaminophen, tramadol  NARCOTICS INDUCED CONSTIPATION: Mild  LIVING WILL AND CODE STATUS: ?  INTERVAL HISTORY: Elizabeth Humphrey 80 y.o. female returns for followup visit accompanied by her husband.  The patient was recently admitted to [facility] complaining of low back and abdominal pain. She was diagnosed with UTI/pyelonephritis with Escherichia coli. She was treated with Rocephin IV switched to Levaquin before discharge. She is feeling much better today and she is here to resume her systemic chemotherapy. She denied having any significant chest pain, shortness of breath, cough or hemoptysis. The patient lost around 10 pounds recently. She denied  having any fever or chills. She denied having any nausea or vomiting. She is expected to complete the course of radiotherapy early next week.  MEDICAL HISTORY: Past Medical History  Diagnosis Date  . Allergy     bee stings/anaphylaxis  . Hyperlipidemia     hx of  . Lung cancer   . COPD (chronic obstructive pulmonary disease) 07/30/13  . Heart murmur   . History of pneumonia   . Arthritis   . Pyelonephritis   . Cough     ALLERGIES:  has no active allergies.  MEDICATIONS:  Current Outpatient Prescriptions  Medication Sig Dispense Refill  . acetaminophen (TYLENOL) 325 MG tablet Take 650 mg by mouth every 6 (six) hours as needed (pain).      . Alum & Mag Hydroxide-Simeth (MAGIC MOUTHWASH) SOLN Take 10 mLs by mouth 4 (four) times daily.  240 mL  2  . aspirin 81 MG tablet Take 81 mg by mouth daily.      [name] emollient (BIAFINE) cream Apply 1 application topically 2 (two) times daily. Apply to affected skin area once skin becomes irriatated or red,itching, after rad txs then daily and bedtime      . lactose free nutrition (BOOST PLUS) LIQD Take 237 mLs by mouth 2 (two) times daily between meals.  120 Can  3  . lidocaine-prilocaine (EMLA) cream Apply to Port-A-Cath one to 2 hours prior to chemotherapy as directed  30 g  1  . polyethylene glycol (MIRALAX / GLYCOLAX) packet Take 17 g by mouth daily.      . prochlorperazine (COMPAZINE) 10 MG tablet Take 1 tablet (10 mg total)  by mouth every 6 (six) hours as needed for nausea or vomiting.  60 tablet  0  . simvastatin (ZOCOR) 40 MG tablet Take 40 mg by mouth at bedtime.      . sucralfate (CARAFATE) 1 G tablet Take 1 tablet (1 g total) by mouth 4 (four) times daily -  with meals and at bedtime.  120 tablet  1  . traMADol (ULTRAM) 50 MG tablet Take 1 tablet by mouth every 6 (six) hours as needed (pain).       [name] HYDROcodone-acetaminophen (HYCET) 7.5-325 mg/15 ml solution Take 10 mLs by mouth 4 (four) times daily as needed for moderate pain.  120 mL  0   . mupirocin ointment (BACTROBAN) 2 % Apply 1 application topically as needed. For cuts and scrapes       No current facility-administered medications for this visit.    SURGICAL HISTORY:  Past Surgical History  Procedure Laterality Date  . Dilation and curettage of uterus  yrs ago  . Tonsillectomy    . Cataract extraction      both eyes  . Video bronchoscopy with endobronchial ultrasound N/A 08/04/2013    Procedure: VIDEO BRONCHOSCOPY WITH ENDOBRONCHIAL ULTRASOUND;  Surgeon: Melrose Nakayama, MD;  Location: [facility];  Service: Thoracic;  Laterality: N/A;    REVIEW OF SYSTEMS:  Constitutional: positive for fatigue Eyes: negative Ears, nose, mouth, throat, and face: negative Respiratory: negative Cardiovascular: negative Gastrointestinal: positive for constipation Genitourinary:positive for urinary incontinence Integument/breast: negative Hematologic/lymphatic: negative Musculoskeletal:negative Neurological: negative Behavioral/Psych: negative Endocrine: negative Allergic/Immunologic: negative   PHYSICAL EXAMINATION: General appearance: alert, cooperative, appears stated age and no distress Head: Normocephalic, without obvious abnormality, atraumatic Neck: no adenopathy, no carotid bruit, no JVD, supple, symmetrical, trachea midline and thyroid not enlarged, symmetric, no tenderness/mass/nodules Lymph nodes: Cervical, supraclavicular, and axillary nodes normal. Resp: clear to auscultation bilaterally Back: symmetric, no curvature. ROM normal. No CVA tenderness. Cardio: regular rate and rhythm, S1, S2 normal, no murmur, click, rub or gallop GI: soft, non-tender; bowel sounds normal; no masses,  no organomegaly Extremities: extremities normal, atraumatic, no cyanosis or edema Neurologic: Alert and oriented X 3, normal strength and tone. Normal symmetric reflexes. Normal coordination and gait  ECOG PERFORMANCE STATUS: 1 - Symptomatic but completely ambulatory  Blood pressure  93/60, pulse 97, temperature 97.7 F (36.5 C), temperature source Oral, resp. rate 18, height 5\' 7"  (1.702 m), weight 165 lb 4.8 oz (74.98 kg), SpO2 98.00%.  LABORATORY DATA: Lab Results  Component Value Date   WBC 5.8 10/15/2013   HGB 8.9* 10/15/2013   HCT 26.6* 10/15/2013   MCV 95.2 10/15/2013   PLT 100* 10/15/2013      Chemistry      Component Value Date/Time   NA 140 10/15/2013 1327   NA 146 10/06/2013 0625   K 3.2* 10/15/2013 1327   K 4.0 10/06/2013 0625   CL 108 10/06/2013 0625   CO2 26 10/15/2013 1327   CO2 23 10/06/2013 0625   BUN 26.5* 10/15/2013 1327   BUN 42* 10/06/2013 0625   CREATININE 0.8 10/15/2013 1327   CREATININE 0.92 10/06/2013 0625      Component Value Date/Time   CALCIUM 9.4 10/15/2013 1327   CALCIUM 9.0 10/06/2013 0625   ALKPHOS 59 10/15/2013 1327   ALKPHOS 59 10/06/2013 0625   AST 20 10/15/2013 1327   AST 10 10/06/2013 0625   ALT 17 10/15/2013 1327   ALT 10 10/06/2013 0625   BILITOT 0.56 10/15/2013 1327   BILITOT 0.5 10/06/2013  0625       RADIOGRAPHIC STUDIES:  ASSESSMENT/PLAN:  This is a very pleasant 80 years old white female was limited stage small cell lung cancer currently undergoing systemic chemotherapy with carboplatin and etoposide status post 3 cycles concurrent with radiation. The patient is tolerating her treatment fairly well with no significant complaints except for the alopecia and fatigue in addition to sore throat and odynophagia from recent radiation.  I would reduce the dose of carboplatin to AUC of 4.5 and etoposide to 100 mg/M2 starting from cycle #4 because of her persistent anemia and fatigue. I recommended for her to continue her current systemic chemotherapy as scheduled. The patient will start cycle #4 today.  She would come back for followup visit in 3 weeks before starting cycle #5 with repeat CT scan of the chest for restaging of her disease.  She was advised to call immediately if she has any concerning symptoms in the interval.  Disclaimer: This note  was dictated with voice recognition software. Similar sounding words can inadvertently be transcribed and may not be corrected upon review. Eilleen Kempf., MD 10/15/2013

## 2013-10-15 NOTE — Patient Instructions (Signed)
Followup visit in 3 weeks with repeat CT scan of the chest.

## 2013-10-15 NOTE — Progress Notes (Signed)
Quick Note:  Call patient with the result and order K dur 20 meq po qd X 7 ______

## 2013-10-16 ENCOUNTER — Ambulatory Visit (HOSPITAL_BASED_OUTPATIENT_CLINIC_OR_DEPARTMENT_OTHER): Payer: Medicare Other

## 2013-10-16 ENCOUNTER — Ambulatory Visit: Payer: Medicare Other | Admitting: Nutrition

## 2013-10-16 ENCOUNTER — Ambulatory Visit
Admission: RE | Admit: 2013-10-16 | Discharge: 2013-10-16 | Disposition: A | Discharge status: Home / Self Care | Payer: Medicare Other | Source: Ambulatory Visit | Attending: Radiation Oncology | Admitting: Radiation Oncology

## 2013-10-16 VITALS — BP 109/51 | HR 92 | Temp 97.7°F | Resp 18

## 2013-10-16 DIAGNOSIS — C342 Malignant neoplasm of middle lobe, bronchus or lung: Secondary | ICD-10-CM

## 2013-10-16 DIAGNOSIS — C3490 Malignant neoplasm of unspecified part of unspecified bronchus or lung: Secondary | ICD-10-CM

## 2013-10-16 DIAGNOSIS — Z5111 Encounter for antineoplastic chemotherapy: Secondary | ICD-10-CM

## 2013-10-16 MED ORDER — HEPARIN SOD (PORK) LOCK FLUSH 100 UNIT/ML IV SOLN
500.0000 [IU] | Freq: Once | INTRAVENOUS | Status: AC | PRN
  Administered 2013-10-16: 500 [IU]
  Filled 2013-10-16: qty 5

## 2013-10-16 MED ORDER — SODIUM CHLORIDE 0.9 % IJ SOLN
10.0000 mL | INTRAMUSCULAR | Status: DC | PRN
  Administered 2013-10-16: 10 mL
  Filled 2013-10-16: qty 10

## 2013-10-16 MED ORDER — SODIUM CHLORIDE 0.9 % IV SOLN
Freq: Once | INTRAVENOUS | Status: AC
  Administered 2013-10-16: 10:00:00 via INTRAVENOUS

## 2013-10-16 MED ORDER — ETOPOSIDE CHEMO INJECTION 1 GM/50ML
100.0000 mg/m2 | Freq: Once | INTRAVENOUS | Status: AC
  Administered 2013-10-16: 200 mg via INTRAVENOUS
  Filled 2013-10-16: qty 10

## 2013-10-16 MED ORDER — DEXAMETHASONE SODIUM PHOSPHATE 10 MG/ML IJ SOLN
10.0000 mg | Freq: Once | INTRAMUSCULAR | Status: AC
  Administered 2013-10-16: 10 mg via INTRAVENOUS

## 2013-10-16 MED ORDER — DEXAMETHASONE SODIUM PHOSPHATE 10 MG/ML IJ SOLN
INTRAMUSCULAR | Status: AC
  Filled 2013-10-16: qty 1

## 2013-10-16 MED ORDER — ONDANSETRON 8 MG/50ML IVPB (CHCC)
8.0000 mg | Freq: Once | INTRAVENOUS | Status: AC
  Administered 2013-10-16: 8 mg via INTRAVENOUS

## 2013-10-16 MED ORDER — ONDANSETRON 8 MG/NS 50 ML IVPB
INTRAVENOUS | Status: AC
  Filled 2013-10-16: qty 8

## 2013-10-16 NOTE — Patient Instructions (Signed)
[facility] Discharge Instructions for Patients Receiving Chemotherapy  Today you received the following chemotherapy agent Etoposide (Vepesid)  To help prevent nausea and vomiting after your treatment, we encourage you to take your nausea medication.   If you develop nausea and vomiting that is not controlled by your nausea medication, call the clinic.   BELOW ARE SYMPTOMS THAT SHOULD BE REPORTED IMMEDIATELY:  *FEVER GREATER THAN 100.5 F  *CHILLS WITH OR WITHOUT FEVER  NAUSEA AND VOMITING THAT IS NOT CONTROLLED WITH YOUR NAUSEA MEDICATION  *UNUSUAL SHORTNESS OF BREATH  *UNUSUAL BRUISING OR BLEEDING  TENDERNESS IN MOUTH AND THROAT WITH OR WITHOUT PRESENCE OF ULCERS  *URINARY PROBLEMS  *BOWEL PROBLEMS  UNUSUAL RASH Items with * indicate a potential emergency and should be followed up as soon as possible.  Feel free to call the clinic you have any questions or concerns. The clinic phone number is [phone].

## 2013-10-16 NOTE — Progress Notes (Signed)
Followup completed with patient in chemotherapy.  Patient reports she is unable to eat secondary to sore throat.  Weight has decreased and was documented as 165.3 pounds February 4, from 181.1 pounds January 6.  Patient reports that she only has 2 more radiation therapy treatments.  She is thrilled and hopes that she will be able to increase her intake soon.  She is working on increasing fluid intake.  She tolerates cold foods better than warm foods.  She does drink ensure.  Nutrition diagnosis: Unintended weight loss continues.  Intervention: Patient educated to consume high-calorie, high-protein foods in 6 mini meals and snacks.  I reviewed foods which patient may tolerate better.  I've encouraged her to increase ensure to Ensure Plus 3 times a day between meals.  Teach back method used.  Monitoring, evaluation, goals: Patient will tolerate increased calories and protein to minimize further weight loss.  Next visit: Thursday, February 26, during chemotherapy.

## 2013-10-17 ENCOUNTER — Telehealth: Payer: Self-pay | Admitting: Medical Oncology

## 2013-10-17 ENCOUNTER — Ambulatory Visit
Admission: RE | Admit: 2013-10-17 | Discharge: 2013-10-17 | Disposition: A | Discharge status: Home / Self Care | Payer: Medicare Other | Source: Ambulatory Visit | Attending: Radiation Oncology | Admitting: Radiation Oncology

## 2013-10-17 ENCOUNTER — Ambulatory Visit (HOSPITAL_BASED_OUTPATIENT_CLINIC_OR_DEPARTMENT_OTHER): Payer: Medicare Other

## 2013-10-17 VITALS — BP 109/47 | HR 77 | Temp 97.4°F | Resp 20

## 2013-10-17 DIAGNOSIS — C3490 Malignant neoplasm of unspecified part of unspecified bronchus or lung: Secondary | ICD-10-CM

## 2013-10-17 DIAGNOSIS — C342 Malignant neoplasm of middle lobe, bronchus or lung: Secondary | ICD-10-CM | POA: Diagnosis not present

## 2013-10-17 DIAGNOSIS — E876 Hypokalemia: Secondary | ICD-10-CM

## 2013-10-17 DIAGNOSIS — Z5111 Encounter for antineoplastic chemotherapy: Secondary | ICD-10-CM

## 2013-10-17 MED ORDER — SODIUM CHLORIDE 0.9 % IV SOLN
Freq: Once | INTRAVENOUS | Status: AC
Start: 2013-10-17 — End: 2013-10-17
  Administered 2013-10-17: 10:00:00 via INTRAVENOUS

## 2013-10-17 MED ORDER — SODIUM CHLORIDE 0.9 % IV SOLN
100.0000 mg/m2 | Freq: Once | INTRAVENOUS | Status: AC
  Administered 2013-10-17: 200 mg via INTRAVENOUS
  Filled 2013-10-17: qty 10

## 2013-10-17 MED ORDER — POTASSIUM CHLORIDE CRYS ER 20 MEQ PO TBCR: 20.0000 meq | EXTENDED_RELEASE_TABLET | Freq: Once | ORAL | Status: DC

## 2013-10-17 MED ORDER — SODIUM CHLORIDE 0.9 % IJ SOLN
10.0000 mL | INTRAMUSCULAR | Status: DC | PRN
  Administered 2013-10-17: 10 mL
  Filled 2013-10-17: qty 10

## 2013-10-17 MED ORDER — DEXAMETHASONE SODIUM PHOSPHATE 10 MG/ML IJ SOLN
INTRAMUSCULAR | Status: AC
  Filled 2013-10-17: qty 1

## 2013-10-17 MED ORDER — DEXAMETHASONE SODIUM PHOSPHATE 10 MG/ML IJ SOLN
10.0000 mg | Freq: Once | INTRAMUSCULAR | Status: AC
  Administered 2013-10-17: 10 mg via INTRAVENOUS

## 2013-10-17 MED ORDER — ONDANSETRON 8 MG/50ML IVPB (CHCC)
8.0000 mg | Freq: Once | INTRAVENOUS | Status: AC
  Administered 2013-10-17: 8 mg via INTRAVENOUS

## 2013-10-17 MED ORDER — HEPARIN SOD (PORK) LOCK FLUSH 100 UNIT/ML IV SOLN
500.0000 [IU] | Freq: Once | INTRAVENOUS | Status: AC | PRN
  Administered 2013-10-17: 500 [IU]
  Filled 2013-10-17: qty 5

## 2013-10-17 MED ORDER — ONDANSETRON 8 MG/NS 50 ML IVPB
INTRAVENOUS | Status: AC
  Filled 2013-10-17: qty 8

## 2013-10-17 NOTE — Patient Instructions (Signed)
[facility] Discharge Instructions for Patients Receiving Chemotherapy  Today you received the following chemotherapy agent Etoposide (Vepesid)  To help prevent nausea and vomiting after your treatment, we encourage you to take your nausea medication.   If you develop nausea and vomiting that is not controlled by your nausea medication, call the clinic.   BELOW ARE SYMPTOMS THAT SHOULD BE REPORTED IMMEDIATELY:  *FEVER GREATER THAN 100.5 F  *CHILLS WITH OR WITHOUT FEVER  NAUSEA AND VOMITING THAT IS NOT CONTROLLED WITH YOUR NAUSEA MEDICATION  *UNUSUAL SHORTNESS OF BREATH  *UNUSUAL BRUISING OR BLEEDING  TENDERNESS IN MOUTH AND THROAT WITH OR WITHOUT PRESENCE OF ULCERS  *URINARY PROBLEMS  *BOWEL PROBLEMS  UNUSUAL RASH Items with * indicate a potential emergency and should be followed up as soon as possible.  Feel free to call the clinic you have any questions or concerns. The clinic phone number is [phone].

## 2013-10-17 NOTE — Telephone Encounter (Signed)
Message copied by Ardeen Garland on Fri Oct 17, 2013 12:04 PM ------      Message from: Curt Bears      Created: Wed Oct 15, 2013  5:27 PM       Call patient with the result and order K dur 20 meq po qd X 7 ------

## 2013-10-18 ENCOUNTER — Ambulatory Visit (HOSPITAL_BASED_OUTPATIENT_CLINIC_OR_DEPARTMENT_OTHER): Payer: Medicare Other

## 2013-10-18 VITALS — BP 122/43 | HR 79 | Temp 96.7°F

## 2013-10-18 DIAGNOSIS — C342 Malignant neoplasm of middle lobe, bronchus or lung: Secondary | ICD-10-CM | POA: Diagnosis not present

## 2013-10-18 MED ORDER — PEGFILGRASTIM INJECTION 6 MG/0.6ML
6.0000 mg | Freq: Once | SUBCUTANEOUS | Status: AC
  Administered 2013-10-18: 6 mg via SUBCUTANEOUS

## 2013-10-20 ENCOUNTER — Encounter: Payer: Self-pay | Admitting: Radiation Oncology

## 2013-10-20 ENCOUNTER — Ambulatory Visit: Payer: Medicare Other

## 2013-10-20 ENCOUNTER — Ambulatory Visit
Admission: RE | Admit: 2013-10-20 | Discharge: 2013-10-20 | Disposition: A | Discharge status: Home / Self Care | Payer: Medicare Other | Source: Ambulatory Visit | Attending: Radiation Oncology | Admitting: Radiation Oncology

## 2013-10-20 VITALS — BP 82/64 | HR 112 | Temp 97.0°F | Resp 20

## 2013-10-20 DIAGNOSIS — C3490 Malignant neoplasm of unspecified part of unspecified bronchus or lung: Secondary | ICD-10-CM

## 2013-10-20 DIAGNOSIS — C342 Malignant neoplasm of middle lobe, bronchus or lung: Secondary | ICD-10-CM | POA: Diagnosis not present

## 2013-10-20 NOTE — Progress Notes (Signed)
[facility]     Rexene Edison, M.D. [place], [place] [address]               Blair Promise, M.D., Ph.D. Phone: [phone]      [place] A. Tammi Klippel, M.D. Fax: [phone]      Jodelle Gross, M.D., Ph.D.         Thea Silversmith, M.D.         Wyvonnia Lora, M.D Weekly Treatment Management Note  Name: Elizabeth Humphrey     MRN: [id number]        CSN: [id number] Date: 10/20/2013      DOB: October 21, 1933  CC: Geoffery Lyons, MD         Reynaldo Minium    Status: Outpatient  Diagnosis: The encounter diagnosis was Small cell lung carcinoma.  Current Dose: 59.4 Gy  Current Fraction: 33  Planned Dose: 59.4 Gy  Narrative: Shatonya D Molina was seen today for weekly treatment management. The chart was checked and MVCT  were reviewed. She is quite fatigued at this time but happy to complete her radiation therapy. She does have esophageal symptoms but is able to drink liquids. She is mildly orthostatic today and I have recommended she force fluids. Also offered IV fluid supplementation today but the patient preferred the oral route at home.  Review of patient's allergies indicates no active allergies. Current Outpatient Prescriptions  Medication Sig Dispense Refill  . acetaminophen (TYLENOL) 325 MG tablet Take 650 mg by mouth every 6 (six) hours as needed (pain).      . Alum & Mag Hydroxide-Simeth (MAGIC MOUTHWASH) SOLN Take 10 mLs by mouth 4 (four) times daily.  240 mL  2  . aspirin 81 MG tablet Take 81 mg by mouth daily.      [name] emollient (BIAFINE) cream Apply 1 application topically 2 (two) times daily. Apply to affected skin area once skin becomes irriatated or red,itching, after rad txs then daily and bedtime      . HYDROcodone-acetaminophen (HYCET) 7.5-325 mg/15 ml solution Take 10 mLs by mouth 4 (four) times daily as needed for moderate pain.  120 mL  0  . lactose free nutrition (BOOST PLUS) LIQD Take 237 mLs by mouth 2 (two) times daily  between meals.  120 Can  3  . lidocaine-prilocaine (EMLA) cream Apply to Port-A-Cath one to 2 hours prior to chemotherapy as directed  30 g  1  . mupirocin ointment (BACTROBAN) 2 % Apply 1 application topically as needed. For cuts and scrapes      . polyethylene glycol (MIRALAX / GLYCOLAX) packet Take 17 g by mouth daily.      . potassium chloride SA (K-DUR,KLOR-CON) 20 MEQ tablet Take 1 tablet (20 mEq total) by mouth once.  7 tablet  0  . prochlorperazine (COMPAZINE) 10 MG tablet Take 1 tablet (10 mg total) by mouth every 6 (six) hours as needed for nausea or vomiting.  60 tablet  0  . simvastatin (ZOCOR) 40 MG tablet Take 40 mg by mouth at bedtime.      . sucralfate (CARAFATE) 1 G tablet Take 1 tablet (1 g total) by mouth 4 (four) times daily -  with meals and at bedtime.  120 tablet  1  . traMADol (ULTRAM) 50 MG tablet Take 1 tablet by mouth every 6 (six) hours as needed (pain).        No current facility-administered medications for this encounter.  Labs:  Lab Results  Component Value Date   WBC 5.8 10/15/2013   HGB 8.9* 10/15/2013   HCT 26.6* 10/15/2013   MCV 95.2 10/15/2013   PLT 100* 10/15/2013   Lab Results  Component Value Date   CREATININE 0.8 10/15/2013   BUN 26.5* 10/15/2013   NA 140 10/15/2013   K 3.2* 10/15/2013   CL 108 10/06/2013   CO2 26 10/15/2013   Lab Results  Component Value Date   ALT 17 10/15/2013   AST 20 10/15/2013   BILITOT 0.56 10/15/2013    Physical Examination:  Filed Vitals:   10/20/13 1013  BP: 82/64  Pulse: 112  Temp: 97 F (36.1 C)  Resp: 20    Wt Readings from Last 3 Encounters:  10/15/13 165 lb 4.8 oz (74.98 kg)  10/14/13 165 lb 11.2 oz (75.161 kg)  10/05/13 170 lb (77.111 kg)    The oral cavity is moist without secondary infection. Patient's skin is well healed at this time.  Lungs - Normal respiratory effort, chest expands symmetrically. Lungs are clear to auscultation, no crackles or wheezes.  Heart has regular rhythm and rate  Abdomen is soft and  non tender with normal bowel sounds  Assessment:  Patient tolerating treatments well  Plan: Routine followup in one month or sooner if IV fluid supplementation is needed.

## 2013-10-20 NOTE — Progress Notes (Signed)
Rad tx 33/33 rt lung, room air sats=100%, poor appetite, drinking milkshakes(1/2 whey, 1/2 milk and ice cream mix), for breakfast,  had beef broth for lunch and another milkshake last night for dinner, hard to swallow foods, too weak to get weighed today stated, last Chemotherapy this past Friday , , has puddings, applesauce, suggested p nut butter and banana in her milkshakes to increase protein, 1 month f/u appt made, b/p low today not drinking enough fluids 10:20 AM

## 2013-10-21 ENCOUNTER — Ambulatory Visit (HOSPITAL_BASED_OUTPATIENT_CLINIC_OR_DEPARTMENT_OTHER): Payer: Medicare Other

## 2013-10-21 ENCOUNTER — Other Ambulatory Visit: Payer: Self-pay | Admitting: *Deleted

## 2013-10-21 ENCOUNTER — Ambulatory Visit
Admission: RE | Admit: 2013-10-21 | Discharge: 2013-10-21 | Disposition: A | Discharge status: Home / Self Care | Payer: Medicare Other | Source: Ambulatory Visit | Attending: Radiation Oncology | Admitting: Radiation Oncology

## 2013-10-21 ENCOUNTER — Telehealth: Payer: Self-pay

## 2013-10-21 ENCOUNTER — Ambulatory Visit: Payer: Medicare Other

## 2013-10-21 ENCOUNTER — Ambulatory Visit (HOSPITAL_COMMUNITY)
Admission: RE | Admit: 2013-10-21 | Discharge: 2013-10-21 | Disposition: A | Discharge status: Home / Self Care | Payer: Medicare Other | Source: Ambulatory Visit | Attending: Internal Medicine | Admitting: Internal Medicine

## 2013-10-21 ENCOUNTER — Other Ambulatory Visit: Payer: Self-pay | Admitting: Radiation Oncology

## 2013-10-21 VITALS — BP 100/61 | HR 110 | Temp 96.9°F | Resp 18

## 2013-10-21 DIAGNOSIS — D649 Anemia, unspecified: Secondary | ICD-10-CM | POA: Diagnosis not present

## 2013-10-21 DIAGNOSIS — C342 Malignant neoplasm of middle lobe, bronchus or lung: Secondary | ICD-10-CM

## 2013-10-21 DIAGNOSIS — R1310 Dysphagia, unspecified: Secondary | ICD-10-CM | POA: Diagnosis not present

## 2013-10-21 DIAGNOSIS — C3490 Malignant neoplasm of unspecified part of unspecified bronchus or lung: Secondary | ICD-10-CM

## 2013-10-21 DIAGNOSIS — C3491 Malignant neoplasm of unspecified part of right bronchus or lung: Secondary | ICD-10-CM

## 2013-10-21 LAB — CBC WITH DIFFERENTIAL/PLATELET
BASO%: 0.3 % (ref 0.0–2.0)
BASOS ABS: 0 10*3/uL (ref 0.0–0.1)
EOS ABS: 0 10*3/uL (ref 0.0–0.5)
EOS%: 0.3 % (ref 0.0–7.0)
HCT: 22.6 % — ABNORMAL LOW (ref 34.8–46.6)
HGB: 7.7 g/dL — ABNORMAL LOW (ref 11.6–15.9)
LYMPH#: 0.5 10*3/uL — AB (ref 0.9–3.3)
LYMPH%: 12.6 % — ABNORMAL LOW (ref 14.0–49.7)
MCH: 31.8 pg (ref 25.1–34.0)
MCHC: 34.1 g/dL (ref 31.5–36.0)
MCV: 93.4 fL (ref 79.5–101.0)
MONO#: 0.1 10*3/uL (ref 0.1–0.9)
MONO%: 1.9 % (ref 0.0–14.0)
NEUT%: 84.9 % — ABNORMAL HIGH (ref 38.4–76.8)
NEUTROS ABS: 3.1 10*3/uL (ref 1.5–6.5)
Platelets: 59 10*3/uL — ABNORMAL LOW (ref 145–400)
RBC: 2.42 10*6/uL — AB (ref 3.70–5.45)
RDW: 20.1 % — AB (ref 11.2–14.5)
WBC: 3.7 10*3/uL — ABNORMAL LOW (ref 3.9–10.3)

## 2013-10-21 LAB — COMPREHENSIVE METABOLIC PANEL (CC13)
ALBUMIN: 3.2 g/dL — AB (ref 3.5–5.0)
ALK PHOS: 63 U/L (ref 40–150)
ALT: 16 U/L (ref 0–55)
AST: 16 U/L (ref 5–34)
Anion Gap: 10 mEq/L (ref 3–11)
BUN: 23.5 mg/dL (ref 7.0–26.0)
CALCIUM: 9.2 mg/dL (ref 8.4–10.4)
CHLORIDE: 104 meq/L (ref 98–109)
CO2: 24 mEq/L (ref 22–29)
Creatinine: 0.7 mg/dL (ref 0.6–1.1)
Glucose: 159 mg/dl — ABNORMAL HIGH (ref 70–140)
POTASSIUM: 3.5 meq/L (ref 3.5–5.1)
SODIUM: 137 meq/L (ref 136–145)
TOTAL PROTEIN: 6 g/dL — AB (ref 6.4–8.3)
Total Bilirubin: 1 mg/dL (ref 0.20–1.20)

## 2013-10-21 LAB — PREPARE RBC (CROSSMATCH)

## 2013-10-21 MED ORDER — KCL IN DEXTROSE-NACL 20-5-0.45 MEQ/L-%-% IV SOLN
1000.0000 mL | Freq: Once | INTRAVENOUS | Status: AC
  Administered 2013-10-21: 1000 mL via INTRAVENOUS
  Filled 2013-10-21: qty 1000

## 2013-10-21 NOTE — Telephone Encounter (Signed)
Mr.Gasaway called stating wife is having difficulty swallowing and would like to go ahead and get iv fluids today.Patient was somewhat orthostatic yesterday but put getting iv fluids thinking she would try pushing  po's at home.Labs and iv fluids have been scheduled for 9:30 am today.Mr.Casanas states he can get her here in 5 minutes.Mr.Gilmore very thankful.

## 2013-10-21 NOTE — Progress Notes (Signed)
Hbg 7.7, pt is not asymptomatic, will be transfused 10/24/13.  Pt is aware.  SLJ

## 2013-10-21 NOTE — Patient Instructions (Signed)
COME BACK FRIDAY FOR BLOOD TRANSFUSION   Dehydration, Adult Dehydration means your body does not have as much fluid as it needs. Your kidneys, brain, and heart will not work properly without the right amount of fluids and salt.  HOME CARE  Ask your doctor how to replace body fluid losses (rehydrate).  Drink enough fluids to keep your pee (urine) clear or pale yellow.  Drink small amounts of fluids often if you feel sick to your stomach (nauseous) or throw up (vomit).  Eat like you normally do.  Avoid:  Foods or drinks high in sugar.  Bubbly (carbonated) drinks.  Juice.  Very hot or cold fluids.  Drinks with caffeine.  Fatty, greasy foods.  Alcohol.  Tobacco.  Eating too much.  Gelatin desserts.  Wash your hands to avoid spreading germs (bacteria, viruses).  Only take medicine as told by your doctor.  Keep all doctor visits as told. GET HELP RIGHT AWAY IF:   You cannot drink something without throwing up.  You get worse even with treatment.  Your vomit has blood in it or looks greenish.  Your poop (stool) has blood in it or looks black and tarry.  You have not peed in 6 to 8 hours.  You pee a small amount of very dark pee.  You have a fever.  You pass out (faint).  You have belly (abdominal) pain that gets worse or stays in one spot (localizes).  You have a rash, stiff neck, or bad headache.  You get easily annoyed, sleepy, or are hard to wake up.  You feel weak, dizzy, or very thirsty. MAKE SURE YOU:   Understand these instructions.  Will watch your condition.  Will get help right away if you are not doing well or get worse. Document Released: 06/24/2009 Document Revised: 11/20/2011 Document Reviewed: 04/17/2011 [facility] Patient Information 2014 [place], [place].

## 2013-10-22 ENCOUNTER — Ambulatory Visit: Payer: Medicare Other

## 2013-10-22 ENCOUNTER — Other Ambulatory Visit: Payer: Medicare Other

## 2013-10-23 ENCOUNTER — Other Ambulatory Visit: Payer: Self-pay | Admitting: Hematology and Oncology

## 2013-10-23 ENCOUNTER — Ambulatory Visit: Payer: Medicare Other

## 2013-10-24 ENCOUNTER — Ambulatory Visit (HOSPITAL_BASED_OUTPATIENT_CLINIC_OR_DEPARTMENT_OTHER): Payer: Medicare Other

## 2013-10-24 VITALS — BP 113/60 | HR 92 | Temp 97.3°F | Resp 18

## 2013-10-24 DIAGNOSIS — D649 Anemia, unspecified: Secondary | ICD-10-CM

## 2013-10-24 MED ORDER — DIPHENHYDRAMINE HCL 25 MG PO CAPS
ORAL_CAPSULE | ORAL | Status: AC
  Filled 2013-10-24: qty 1

## 2013-10-24 MED ORDER — SODIUM CHLORIDE 0.9 % IJ SOLN
10.0000 mL | INTRAMUSCULAR | Status: AC | PRN
  Administered 2013-10-24: 10 mL
  Filled 2013-10-24: qty 10

## 2013-10-24 MED ORDER — DIPHENHYDRAMINE HCL 25 MG PO CAPS
25.0000 mg | ORAL_CAPSULE | Freq: Once | ORAL | Status: AC
  Administered 2013-10-24: 25 mg via ORAL

## 2013-10-24 MED ORDER — ACETAMINOPHEN 325 MG PO TABS
650.0000 mg | ORAL_TABLET | Freq: Once | ORAL | Status: AC
  Administered 2013-10-24: 650 mg via ORAL

## 2013-10-24 MED ORDER — SODIUM CHLORIDE 0.9 % IV SOLN
250.0000 mL | Freq: Once | INTRAVENOUS | Status: AC
  Administered 2013-10-24: 250 mL via INTRAVENOUS

## 2013-10-24 MED ORDER — HEPARIN SOD (PORK) LOCK FLUSH 100 UNIT/ML IV SOLN
500.0000 [IU] | Freq: Every day | INTRAVENOUS | Status: AC | PRN
  Administered 2013-10-24: 500 [IU]
  Filled 2013-10-24: qty 5

## 2013-10-24 MED ORDER — ACETAMINOPHEN 325 MG PO TABS
ORAL_TABLET | ORAL | Status: AC
  Filled 2013-10-24: qty 2

## 2013-10-24 NOTE — Patient Instructions (Signed)
Blood Transfusion Information WHAT IS A BLOOD TRANSFUSION? A transfusion is the replacement of blood or some of its parts. Blood is made up of multiple cells which provide different functions.  Red blood cells carry oxygen and are used for blood loss replacement.  White blood cells fight against infection.  Platelets control bleeding.  Plasma helps clot blood.  Other blood products are available for specialized needs, such as hemophilia or other clotting disorders. BEFORE THE TRANSFUSION  Who gives blood for transfusions?   You may be able to donate blood to be used at a later date on yourself (autologous donation).  Relatives can be asked to donate blood. This is generally not any safer than if you have received blood from a stranger. The same precautions are taken to ensure safety when a relative's blood is donated.  Healthy volunteers who are fully evaluated to make sure their blood is safe. This is blood bank blood. Transfusion therapy is the safest it has ever been in the practice of medicine. Before blood is taken from a donor, a complete history is taken to make sure that person has no history of diseases nor engages in risky social behavior (examples are intravenous drug use or sexual activity with multiple partners). The donor's travel history is screened to minimize risk of transmitting infections, such as malaria. The donated blood is tested for signs of infectious diseases, such as HIV and hepatitis. The blood is then tested to be sure it is compatible with you in order to minimize the chance of a transfusion reaction. If you or a relative donates blood, this is often done in anticipation of surgery and is not appropriate for emergency situations. It takes many days to process the donated blood. RISKS AND COMPLICATIONS Although transfusion therapy is very safe and saves many lives, the main dangers of transfusion include:   Getting an infectious disease.  Developing a  transfusion reaction. This is an allergic reaction to something in the blood you were given. Every precaution is taken to prevent this. The decision to have a blood transfusion has been considered carefully by your caregiver before blood is given. Blood is not given unless the benefits outweigh the risks. AFTER THE TRANSFUSION  Right after receiving a blood transfusion, you will usually feel much better and more energetic. This is especially true if your red blood cells have gotten low (anemic). The transfusion raises the level of the red blood cells which carry oxygen, and this usually causes an energy increase.  The nurse administering the transfusion will monitor you carefully for complications. HOME CARE INSTRUCTIONS  No special instructions are needed after a transfusion. You may find your energy is better. Speak with your caregiver about any limitations on activity for underlying diseases you may have. SEEK MEDICAL CARE IF:   Your condition is not improving after your transfusion.  You develop redness or irritation at the intravenous (IV) site. SEEK IMMEDIATE MEDICAL CARE IF:  Any of the following symptoms occur over the next 12 hours:  Shaking chills.  You have a temperature by mouth above 102 F (38.9 C), not controlled by medicine.  Chest, back, or muscle pain.  People around you feel you are not acting correctly or are confused.  Shortness of breath or difficulty breathing.  Dizziness and fainting.  You get a rash or develop hives.  You have a decrease in urine output.  Your urine turns a dark color or changes to pink, red, or brown. Any of the following   symptoms occur over the next 10 days:  You have a temperature by mouth above 102 F (38.9 C), not controlled by medicine.  Shortness of breath.  Weakness after normal activity.  The white part of the eye turns yellow (jaundice).  You have a decrease in the amount of urine or are urinating less often.  Your  urine turns a dark color or changes to pink, red, or brown. Document Released: 08/25/2000 Document Revised: 11/20/2011 Document Reviewed: 04/13/2008 [employer] Patient Information 2014 [employer].  

## 2013-10-25 LAB — TYPE AND SCREEN
ABO/RH(D): O NEG
Antibody Screen: NEGATIVE
UNIT DIVISION: 0
Unit division: 0

## 2013-10-29 ENCOUNTER — Ambulatory Visit (HOSPITAL_BASED_OUTPATIENT_CLINIC_OR_DEPARTMENT_OTHER): Payer: Medicare Other

## 2013-10-29 ENCOUNTER — Other Ambulatory Visit: Payer: Medicare Other

## 2013-10-29 ENCOUNTER — Other Ambulatory Visit: Payer: Self-pay | Admitting: *Deleted

## 2013-10-29 ENCOUNTER — Encounter: Payer: Self-pay | Admitting: Specialist

## 2013-10-29 VITALS — BP 105/54 | HR 100 | Temp 96.5°F | Resp 18

## 2013-10-29 DIAGNOSIS — C3490 Malignant neoplasm of unspecified part of unspecified bronchus or lung: Secondary | ICD-10-CM

## 2013-10-29 DIAGNOSIS — Z5189 Encounter for other specified aftercare: Secondary | ICD-10-CM

## 2013-10-29 DIAGNOSIS — C342 Malignant neoplasm of middle lobe, bronchus or lung: Secondary | ICD-10-CM

## 2013-10-29 DIAGNOSIS — E876 Hypokalemia: Secondary | ICD-10-CM

## 2013-10-29 DIAGNOSIS — D649 Anemia, unspecified: Secondary | ICD-10-CM | POA: Diagnosis not present

## 2013-10-29 LAB — CBC WITH DIFFERENTIAL/PLATELET
BASO%: 0.2 % (ref 0.0–2.0)
Basophils Absolute: 0 10*3/uL (ref 0.0–0.1)
EOS ABS: 0 10*3/uL (ref 0.0–0.5)
EOS%: 0.2 % (ref 0.0–7.0)
HEMATOCRIT: 27 % — AB (ref 34.8–46.6)
HGB: 9.3 g/dL — ABNORMAL LOW (ref 11.6–15.9)
LYMPH#: 1.5 10*3/uL (ref 0.9–3.3)
LYMPH%: 23.9 % (ref 14.0–49.7)
MCH: 31.4 pg (ref 25.1–34.0)
MCHC: 34.4 g/dL (ref 31.5–36.0)
MCV: 91.2 fL (ref 79.5–101.0)
MONO#: 0.8 10*3/uL (ref 0.1–0.9)
MONO%: 12.6 % (ref 0.0–14.0)
NEUT%: 63.1 % (ref 38.4–76.8)
NEUTROS ABS: 3.9 10*3/uL (ref 1.5–6.5)
Platelets: 12 10*3/uL — ABNORMAL LOW (ref 145–400)
RBC: 2.96 10*6/uL — ABNORMAL LOW (ref 3.70–5.45)
RDW: 17.4 % — AB (ref 11.2–14.5)
WBC: 6.2 10*3/uL (ref 3.9–10.3)
nRBC: 0 % (ref 0–0)

## 2013-10-29 LAB — BASIC METABOLIC PANEL (CC13)
Anion Gap: 11 mEq/L (ref 3–11)
BUN: 29.4 mg/dL — AB (ref 7.0–26.0)
CO2: 26 meq/L (ref 22–29)
Calcium: 9.5 mg/dL (ref 8.4–10.4)
Chloride: 104 mEq/L (ref 98–109)
Creatinine: 0.7 mg/dL (ref 0.6–1.1)
Glucose: 119 mg/dl (ref 70–140)
POTASSIUM: 3 meq/L — AB (ref 3.5–5.1)
SODIUM: 141 meq/L (ref 136–145)

## 2013-10-29 MED ORDER — ACETAMINOPHEN 325 MG PO TABS
650.0000 mg | ORAL_TABLET | Freq: Once | ORAL | Status: AC
  Administered 2013-10-29: 650 mg via ORAL

## 2013-10-29 MED ORDER — POTASSIUM CHLORIDE CRYS ER 20 MEQ PO TBCR: 20.0000 meq | EXTENDED_RELEASE_TABLET | Freq: Once | ORAL | Status: DC

## 2013-10-29 MED ORDER — HEPARIN SOD (PORK) LOCK FLUSH 100 UNIT/ML IV SOLN
500.0000 [IU] | Freq: Once | INTRAVENOUS | Status: AC
  Administered 2013-10-29: 500 [IU] via INTRAVENOUS
  Filled 2013-10-29: qty 5

## 2013-10-29 MED ORDER — SODIUM CHLORIDE 0.9 % IJ SOLN
10.0000 mL | INTRAMUSCULAR | Status: DC | PRN
  Administered 2013-10-29: 10 mL via INTRAVENOUS
  Filled 2013-10-29: qty 10

## 2013-10-29 MED ORDER — ACETAMINOPHEN 325 MG PO TABS
ORAL_TABLET | ORAL | Status: AC
  Filled 2013-10-29: qty 2

## 2013-10-29 MED ORDER — ONDANSETRON 8 MG/NS 50 ML IVPB
INTRAVENOUS | Status: AC
  Filled 2013-10-29: qty 8

## 2013-10-29 MED ORDER — ONDANSETRON 8 MG/50ML IVPB (CHCC)
8.0000 mg | Freq: Once | INTRAVENOUS | Status: AC
  Administered 2013-10-29: 8 mg via INTRAVENOUS

## 2013-10-29 MED ORDER — SODIUM CHLORIDE 0.9 % IV SOLN
Freq: Once | INTRAVENOUS | Status: AC
  Administered 2013-10-29: 15:00:00 via INTRAVENOUS
  Filled 2013-10-29: qty 1000

## 2013-10-29 NOTE — Progress Notes (Signed)
Visited in infusion room. Patient is [employer]. She requested Ash Wednesday ashes. Brought these and conducted the ritual. Then she spent the rest of her time telling the story of her faith and how it has supported her. Explored hope with her; identified and reinforced appropriate coping strategies.  Epifania Gore, [facility], PhD

## 2013-10-29 NOTE — Telephone Encounter (Signed)
Review of results w/patient: Hgb improved, but platelet count down to 12,000. She denies any bleeding or bruising, but does report feeling weak and lightheaded. Asking if she could have IV fluids today? Per Dr. Julien Nordmann : OK for 1 liter NS today and recheck labs in 1 week as scheduled. Noted K+ level to be 3.0. Spoke with Awilda Metro, PA: Ordered 20 meq KCL in the liter of fluid today and order 1 week supply of K-dur 20 meq to take daily as done earlier in month. Patient will return to clinic at 2pm for her fluids today.

## 2013-10-29 NOTE — Patient Instructions (Signed)
Dehydration Dehydration is when you lose more fluids from the body than you take in. Vital organs such as the kidneys, brain, and heart cannot function without a proper amount of fluids and salt. Any loss of fluids from the body can cause dehydration.  Older adults are at a higher risk of dehydration than younger adults. As we age, our bodies are less able to conserve water and do not respond to temperature changes as well. Also, older adults do not become thirsty as easily or quickly. Because of this, older adults often do not realize they need to increase fluids to avoid dehydration.  CAUSES   Vomiting.  Diarrhea.  Excessive sweating.  Excessive urination.  Fever.  Certain medicines, such as blood pressure medicines called diuretics.  Poorly controlled blood sugars. SIGNS AND SYMPTOMS  Mild dehydration:  Thirst.  Dry lips.  Slightly dry mouth. Moderate dehydration:  Very dry mouth.  Sunken eyes.  Skin does not bounce back quickly when lightly pinched and released.  Dark urine and decreased urine production.  Decreased tear production.  Headache. Severe dehydration:  Very dry mouth.  Extreme thirst.  Rapid, weak pulse (more than 100 beats per minute at rest).  Cold hands and feet.  Not able to sweat in spite of heat.  Rapid breathing.  Blue lips.  Confusion and lethargy.  Difficulty being awakened.  Minimal urine production.  No tears. DIAGNOSIS  Your health care provider will diagnose dehydration based on your symptoms and your exam. Blood and urine tests will help confirm the diagnosis. The diagnostic evaluation should also identify the cause of dehydration. TREATMENT  Treatment of mild or moderate dehydration can often be done at home by increasing the amount of fluids that you drink. It is best to drink small amounts of fluid more often. Drinking too much at one time can make vomiting worse. Severe dehydration needs to be treated at the hospital.  You may be given IV fluids that contain water and electrolytes. HOME CARE INSTRUCTIONS   Ask your health care provider about specific rehydration instructions.  Drink enough fluids to keep your urine clear or pale yellow.  Drink small amounts frequently if you have nausea and vomiting.  Eat as you normally do.  Avoid:  Foods or drinks high in sugar.  Carbonated drinks.  Juice.  Extremely hot or cold fluids.  Drinks with caffeine.  Fatty, greasy foods.  Alcohol.  Tobacco.  Overeating.  Gelatin desserts.  Wash your hands well to avoid spreading bacteria and viruses.  Only take over-the-counter or prescription medicines for pain, discomfort, or fever as directed by your health care provider.  Ask your health care provider if you should continue all prescribed and over-the-counter medicines.  Keep all follow-up appointments with your health care provider. SEEK MEDICAL CARE IF:  You have abdominal pain, and it increases or stays in one area (localizes).  You have a rash, stiff neck, or severe headache.  You are irritable, sleepy, or difficult to awaken.  You are weak, dizzy, or extremely thirsty. SEEK IMMEDIATE MEDICAL CARE IF:   You are unable to keep fluids down, or you get worse despite treatment.  You have frequent episodes of vomiting or diarrhea.  You have blood or green matter (bile) in your vomit.  You have blood in your stool, or your stool looks black and tarry.  You have not urinated in 6 8 hours, or you have only urinated a small amount of very dark urine.  You have a fever.  You faint. MAKE SURE YOU:   Understand these instructions.  Will watch your condition.  Will get help right away if you are not doing well or get worse. Document Released: 11/18/2003 Document Revised: 06/18/2013 Document Reviewed: 05/05/2013 [facility] Patient Information 2014 [place].   Hypokalemia Hypokalemia means that the amount of potassium in the  blood is lower than normal.Potassium is a chemical, called an electrolyte, that helps regulate the amount of fluid in the body. It also stimulates muscle contraction and helps nerves function properly.Most of the body's potassium is inside of cells, and only a very small amount is in the blood. Because the amount in the blood is so small, minor changes can be life-threatening. CAUSES  Antibiotics.  Diarrhea or vomiting.  Using laxatives too much, which can cause diarrhea.  Chronic kidney disease.  Water pills (diuretics).  Eating disorders (bulimia).  Low magnesium level.  Sweating a lot. SIGNS AND SYMPTOMS  Weakness.  Constipation.  Fatigue.  Muscle cramps.  Mental confusion.  Skipped heartbeats or irregular heartbeat (palpitations).  Tingling or numbness. DIAGNOSIS  Your health care provider can diagnose hypokalemia with blood tests. In addition to checking your potassium level, your health care provider may also check other lab tests. TREATMENT Hypokalemia can be treated with potassium supplements taken by mouth or adjustments in your current medicines. If your potassium level is very low, you may need to get potassium through a vein (IV) and be monitored in the hospital. A diet high in potassium is also helpful. Foods high in potassium are:  Nuts, such as peanuts and pistachios.  Seeds, such as sunflower seeds and pumpkin seeds.  Peas, lentils, and lima beans.  Whole grain and bran cereals and breads.  Fresh fruit and vegetables, such as apricots, avocado, bananas, cantaloupe, kiwi, oranges, tomatoes, asparagus, and potatoes.  Orange and tomato juices.  Red meats.  Fruit yogurt. HOME CARE INSTRUCTIONS  Take all medicines as prescribed by your health care provider.  Maintain a healthy diet by including nutritious food, such as fruits, vegetables, nuts, whole grains, and lean meats.  If you are taking a laxative, be sure to follow the directions on the  label. SEEK MEDICAL CARE IF:  Your weakness gets worse.  You feel your heart pounding or racing.  You are vomiting or having diarrhea.  You are diabetic and having trouble keeping your blood glucose in the normal range. SEEK IMMEDIATE MEDICAL CARE IF:  You have chest pain, shortness of breath, or dizziness.  You are vomiting or having diarrhea for more than 2 days.  You faint. MAKE SURE YOU:   Understand these instructions.  Will watch your condition.  Will get help right away if you are not doing well or get worse. Document Released: 08/28/2005 Document Revised: 06/18/2013 Document Reviewed: 02/28/2013 [facility] Patient Information 2014 [place].

## 2013-11-02 ENCOUNTER — Encounter: Payer: Self-pay | Admitting: Radiation Oncology

## 2013-11-02 NOTE — Progress Notes (Signed)
  Radiation Oncology         [phone] ________________________________  Name: Elizabeth Humphrey MRN: [id number]  Date: 11/02/2013  DOB: Feb 20, 1934  SIMULATION AND TREATMENT PLANNING NOTE - performed on December 9  DIAGNOSIS:  Limited stage small cell lung cancer  NARRATIVE:  The patient was brought to the [facility].  Identity was confirmed.  All relevant records and images related to the planned course of therapy were reviewed.  The patient freely provided informed written consent to proceed with treatment after reviewing the details related to the planned course of therapy. The consent form was witnessed and verified by the simulation staff.  Then, the patient was set-up in a stable reproducible  supine position for radiation therapy.  CT images were obtained.  Surface markings were placed.  The CT images were loaded into the planning software.  Then the target and avoidance structures were contoured.  Treatment planning then occurred.  The radiation prescription was entered and confirmed.  Then, I designed and supervised the construction of a total of 6 medically necessary complex treatment devices.  I have requested : 3D Simulation  I have requested a DVH of the following structures: lungs, heart, esophagus.  I have ordered:dose calc.  PLAN:  The patient will receive 59.4 Gy in 33 fractions along with radiosensitizing chemotherapy.   _______________________________________   Special treatment procedure note   The patient will be receiving radiosensitizing chemotherapy throughout her course of chest radiation treatments. Given the increased potential for toxicities as well as the necessity for close monitoring of the patient and bloodwork, this constitutes a special treatment procedure. ________________________________  Rulon Sera.D.

## 2013-11-02 NOTE — Progress Notes (Signed)
  Radiation Oncology         [phone] ________________________________  Name: Elizabeth Humphrey MRN: [id number]  Date: 11/02/2013  DOB: 1933-12-11  End of Treatment Note  Diagnosis:   Limited stage small cell lung cancer     Indication for treatment:  Definitive treatment along with radiosensitizing chemotherapy       Radiation treatment dates:   08/26/2013 through 10/20/2013  Site/dose:   Central chest area of 59.4 gray in 33 fractions  Beams/energy:   3-D conformal using multiple beams  Narrative: The patient tolerated radiation treatment relatively well.   She did experience fatigue as well as esophagitis limiting her oral intake. Patient was admitted during the latter portion of her radiation therapy for pancytopenia. The patient's radiation fields were held during her admission.  Plan: The patient has completed radiation treatment. The patient will return to radiation oncology clinic for routine followup in one month. I advised them to call or return sooner if they have any questions or concerns related to their recovery or treatment.  -----------------------------------  Blair Promise, PhD, MD

## 2013-11-04 ENCOUNTER — Ambulatory Visit (HOSPITAL_COMMUNITY)
Admission: RE | Admit: 2013-11-04 | Discharge: 2013-11-04 | Disposition: A | Discharge status: Home / Self Care | Payer: Medicare Other | Source: Ambulatory Visit | Attending: Internal Medicine | Admitting: Internal Medicine

## 2013-11-04 ENCOUNTER — Encounter (HOSPITAL_COMMUNITY): Payer: Self-pay

## 2013-11-04 DIAGNOSIS — C3490 Malignant neoplasm of unspecified part of unspecified bronchus or lung: Secondary | ICD-10-CM | POA: Diagnosis not present

## 2013-11-04 DIAGNOSIS — Z9221 Personal history of antineoplastic chemotherapy: Secondary | ICD-10-CM | POA: Diagnosis not present

## 2013-11-04 DIAGNOSIS — Z923 Personal history of irradiation: Secondary | ICD-10-CM | POA: Insufficient documentation

## 2013-11-04 MED ORDER — IOHEXOL 300 MG/ML  SOLN
80.0000 mL | Freq: Once | INTRAMUSCULAR | Status: AC | PRN
  Administered 2013-11-04: 80 mL via INTRAVENOUS

## 2013-11-05 ENCOUNTER — Telehealth: Payer: Self-pay | Admitting: Internal Medicine

## 2013-11-05 ENCOUNTER — Other Ambulatory Visit (HOSPITAL_BASED_OUTPATIENT_CLINIC_OR_DEPARTMENT_OTHER): Payer: Medicare Other

## 2013-11-05 ENCOUNTER — Ambulatory Visit: Payer: Medicare Other

## 2013-11-05 ENCOUNTER — Ambulatory Visit (HOSPITAL_BASED_OUTPATIENT_CLINIC_OR_DEPARTMENT_OTHER): Payer: Medicare Other | Admitting: Internal Medicine

## 2013-11-05 ENCOUNTER — Encounter: Payer: Self-pay | Admitting: Internal Medicine

## 2013-11-05 VITALS — BP 101/46 | HR 118 | Temp 98.5°F | Resp 18 | Ht 67.0 in | Wt 156.4 lb

## 2013-11-05 DIAGNOSIS — C342 Malignant neoplasm of middle lobe, bronchus or lung: Secondary | ICD-10-CM | POA: Diagnosis not present

## 2013-11-05 DIAGNOSIS — C3490 Malignant neoplasm of unspecified part of unspecified bronchus or lung: Secondary | ICD-10-CM

## 2013-11-05 DIAGNOSIS — R1310 Dysphagia, unspecified: Secondary | ICD-10-CM

## 2013-11-05 LAB — COMPREHENSIVE METABOLIC PANEL (CC13)
ALT: 18 U/L (ref 0–55)
AST: 19 U/L (ref 5–34)
Albumin: 3.3 g/dL — ABNORMAL LOW (ref 3.5–5.0)
Alkaline Phosphatase: 74 U/L (ref 40–150)
Anion Gap: 12 mEq/L — ABNORMAL HIGH (ref 3–11)
BILIRUBIN TOTAL: 0.58 mg/dL (ref 0.20–1.20)
BUN: 17.2 mg/dL (ref 7.0–26.0)
CO2: 22 mEq/L (ref 22–29)
CREATININE: 0.7 mg/dL (ref 0.6–1.1)
Calcium: 9 mg/dL (ref 8.4–10.4)
Chloride: 105 mEq/L (ref 98–109)
Glucose: 120 mg/dl (ref 70–140)
Potassium: 3.5 mEq/L (ref 3.5–5.1)
Sodium: 139 mEq/L (ref 136–145)
Total Protein: 6.5 g/dL (ref 6.4–8.3)

## 2013-11-05 LAB — CBC WITH DIFFERENTIAL/PLATELET
BASO%: 0.4 % (ref 0.0–2.0)
BASOS ABS: 0 10*3/uL (ref 0.0–0.1)
EOS%: 0.1 % (ref 0.0–7.0)
Eosinophils Absolute: 0 10*3/uL (ref 0.0–0.5)
HEMATOCRIT: 25.9 % — AB (ref 34.8–46.6)
HEMOGLOBIN: 8.9 g/dL — AB (ref 11.6–15.9)
LYMPH%: 16.8 % (ref 14.0–49.7)
MCH: 33.1 pg (ref 25.1–34.0)
MCHC: 34.3 g/dL (ref 31.5–36.0)
MCV: 96.6 fL (ref 79.5–101.0)
MONO#: 0.7 10*3/uL (ref 0.1–0.9)
MONO%: 10.6 % (ref 0.0–14.0)
NEUT#: 4.8 10*3/uL (ref 1.5–6.5)
NEUT%: 72.1 % (ref 38.4–76.8)
Platelets: 57 10*3/uL — ABNORMAL LOW (ref 145–400)
RBC: 2.68 10*6/uL — ABNORMAL LOW (ref 3.70–5.45)
RDW: 18.6 % — ABNORMAL HIGH (ref 11.2–14.5)
WBC: 6.6 10*3/uL (ref 3.9–10.3)
lymph#: 1.1 10*3/uL (ref 0.9–3.3)

## 2013-11-05 NOTE — Progress Notes (Signed)
[facility]  [phone] Fax:([phone]) [phone]  OFFICE PROGRESS NOTE   Geoffery Lyons, MD [place], [place]. [place] [address]  DIAGNOSIS: Small cell lung carcinoma, limited stage   Primary site: Lung (Right)   Staging method: AJCC 7th Edition   Clinical: Stage IIIA (T2a, N2, M0) signed by Curt Bears, MD on 08/06/2013  3:32 PM   Summary: Stage IIIA (T2a, N2, M0)  PRIOR THERAPY: None  CURRENT THERAPY: Systemic chemotherapy with carboplatin for an AUC of 5 given on day 1 and etoposide 120 mg per meter squared on days 1, 2 and 3 with Neulasta support given on day 4. This given concurrent with radiation. Status post 4 cycles  DISEASE STAGE: Small cell lung carcinoma, limited stage   Primary site: Lung (Right)   Staging method: AJCC 7th Edition   Clinical: Stage IIIA (T2a, N2, M0) signed by Curt Bears, MD on 08/06/2013  3:32 PM   Summary: Stage IIIA (T2a, N2, M0)  CHEMOTHERAPY INTENT: curative  CURRENT # OF CHEMOTHERAPY CYCLES: 4  CURRENT ANTIEMETICS: Zofran, dexamethasone and Compazine  CURRENT SMOKING STATUS: Former smoker, quit 09/12/1991  ORAL CHEMOTHERAPY AND CONSENT: N/A  CURRENT BISPHOSPHONATES USE: None  PAIN MANAGEMENT: Hydrocodone and acetaminophen, tramadol  NARCOTICS INDUCED CONSTIPATION: Mild  LIVING WILL AND CODE STATUS: ?  INTERVAL HISTORY: Tonee D Bourque 80 y.o. female returns for followup visit accompanied by her husband.  The patient is feeling fine today with no specific complaints except for fatigue as well as sore throat and odynophagia secondary to radiation therapy. She tolerated the last cycle of her systemic chemotherapy with carboplatin and etoposide fairly well. The patient denied having any significant weight loss or night sweats. She denied having any nausea or vomiting, no fever or chills. She had repeat CT scan of the chest performed recently and she is here for  evaluation and discussion of her scan results.  MEDICAL HISTORY: Past Medical History  Diagnosis Date  . Allergy     bee stings/anaphylaxis  . Hyperlipidemia     hx of  . COPD (chronic obstructive pulmonary disease) 07/30/13  . Heart murmur   . History of pneumonia   . Arthritis   . Pyelonephritis   . Cough   . Lung cancer dx'd 07/2013    ALLERGIES:  has No Known Allergies.  MEDICATIONS:  Current Outpatient Prescriptions  Medication Sig Dispense Refill  . acetaminophen (TYLENOL) 325 MG tablet Take 650 mg by mouth every 6 (six) hours as needed (pain).      . Alum & Mag Hydroxide-Simeth (MAGIC MOUTHWASH) SOLN Take 10 mLs by mouth 4 (four) times daily.  240 mL  2  . aspirin 81 MG tablet Take 81 mg by mouth daily.      [name] emollient (BIAFINE) cream Apply 1 application topically 2 (two) times daily. Apply to affected skin area once skin becomes irriatated or red,itching, after rad txs then daily and bedtime      . HYDROcodone-acetaminophen (HYCET) 7.5-325 mg/15 ml solution Take 10 mLs by mouth 4 (four) times daily as needed for moderate pain.  120 mL  0  . lactose free nutrition (BOOST PLUS) LIQD Take 237 mLs by mouth 2 (two) times daily between meals.  120 Can  3  . lidocaine-prilocaine (EMLA) cream Apply to Port-A-Cath one to 2 hours prior to chemotherapy as directed  30 g  1  . mupirocin ointment (BACTROBAN) 2 % Apply 1 application topically as needed. For cuts and  scrapes      . polyethylene glycol (MIRALAX / GLYCOLAX) packet Take 17 g by mouth daily.      . potassium chloride SA (K-DUR,KLOR-CON) 20 MEQ tablet Take 1 tablet (20 mEq total) by mouth once. X 7 days  7 tablet  0  . prochlorperazine (COMPAZINE) 10 MG tablet TAKE 1 TABLET BY MOUTH EVERY SIX HOURS AS NEEDED FOR NAUSEA OR VOMITING.  30 tablet  1  . simvastatin (ZOCOR) 40 MG tablet Take 40 mg by mouth at bedtime.      . sucralfate (CARAFATE) 1 G tablet Take 1 tablet (1 g total) by mouth 4 (four) times daily -  with meals and  at bedtime.  120 tablet  1  . traMADol (ULTRAM) 50 MG tablet Take 1 tablet by mouth every 6 (six) hours as needed (pain).        No current facility-administered medications for this visit.    SURGICAL HISTORY:  Past Surgical History  Procedure Laterality Date  . Dilation and curettage of uterus  yrs ago  . Tonsillectomy    . Cataract extraction      both eyes  . Video bronchoscopy with endobronchial ultrasound N/A 08/04/2013    Procedure: VIDEO BRONCHOSCOPY WITH ENDOBRONCHIAL ULTRASOUND;  Surgeon: Melrose Nakayama, MD;  Location: [facility];  Service: Thoracic;  Laterality: N/A;    REVIEW OF SYSTEMS:  Constitutional: positive for fatigue Eyes: negative Ears, nose, mouth, throat, and face: negative Respiratory: negative Cardiovascular: negative Gastrointestinal: positive for constipation Genitourinary:positive for urinary incontinence Integument/breast: negative Hematologic/lymphatic: negative Musculoskeletal:negative Neurological: negative Behavioral/Psych: negative Endocrine: negative Allergic/Immunologic: negative   PHYSICAL EXAMINATION: General appearance: alert, cooperative, appears stated age and no distress Head: Normocephalic, without obvious abnormality, atraumatic Neck: no adenopathy, no carotid bruit, no JVD, supple, symmetrical, trachea midline and thyroid not enlarged, symmetric, no tenderness/mass/nodules Lymph nodes: Cervical, supraclavicular, and axillary nodes normal. Resp: clear to auscultation bilaterally Back: symmetric, no curvature. ROM normal. No CVA tenderness. Cardio: regular rate and rhythm, S1, S2 normal, no murmur, click, rub or gallop GI: soft, non-tender; bowel sounds normal; no masses,  no organomegaly Extremities: extremities normal, atraumatic, no cyanosis or edema Neurologic: Alert and oriented X 3, normal strength and tone. Normal symmetric reflexes. Normal coordination and gait  ECOG PERFORMANCE STATUS: 1 - Symptomatic but completely  ambulatory  Blood pressure 101/46, pulse 118, temperature 98.5 F (36.9 C), temperature source Oral, resp. rate 18, height 5\' 7"  (1.702 m), weight 156 lb 6.4 oz (70.943 kg), SpO2 99.00%.  LABORATORY DATA: Lab Results  Component Value Date   WBC 6.6 11/05/2013   HGB 8.9* 11/05/2013   HCT 25.9* 11/05/2013   MCV 96.6 11/05/2013   PLT 57* 11/05/2013      Chemistry      Component Value Date/Time   NA 141 10/29/2013 1145   NA 146 10/06/2013 0625   K 3.0* 10/29/2013 1145   K 4.0 10/06/2013 0625   CL 108 10/06/2013 0625   CO2 26 10/29/2013 1145   CO2 23 10/06/2013 0625   BUN 29.4* 10/29/2013 1145   BUN 42* 10/06/2013 0625   CREATININE 0.7 10/29/2013 1145   CREATININE 0.92 10/06/2013 0625      Component Value Date/Time   CALCIUM 9.5 10/29/2013 1145   CALCIUM 9.0 10/06/2013 0625   ALKPHOS 63 10/21/2013 1003   ALKPHOS 59 10/06/2013 0625   AST 16 10/21/2013 1003   AST 10 10/06/2013 0625   ALT 16 10/21/2013 1003   ALT 10 10/06/2013 1941  BILITOT 1.00 10/21/2013 1003   BILITOT 0.5 10/06/2013 3546       RADIOGRAPHIC STUDIES: Ct Chest W Contrast  11/04/2013   CLINICAL DATA:  Restaging lung cancer. Chemotherapy in progress. Radiation therapy complete.  EXAM: CT CHEST WITH CONTRAST  TECHNIQUE: Multidetector CT imaging of the chest was performed during intravenous contrast administration.  CONTRAST:  8mL OMNIPAQUE IOHEXOL 300 MG/ML  SOLN  COMPARISON:  DG ABD ACUTE W/CHEST dated 10/04/2013; CT CHEST W/CM dated 09/19/2013; NM PET IMAGE INITIAL (PI) SKULL BASE TO THIGH dated 07/24/2013  FINDINGS: There is a port in the right chest wall. No axillary or supraclavicular lymphadenopathy. Subcarinal lymph node measuring 13 mm compared to 14 mm on prior. No hilar adenopathy evident. No pericardial fluid. Esophagus is normal.  There is a band of parenchymal thickening in the right upper lobe (image 8, series 5) not changed from prior. Right upper lobe nodule measures 6 mm (image 13) not changed from prior. Within the right  middle lobe 20 mm x 9 mm nodule compares to 18 mm x 14 mm nodule on prior for no significant change. There is a new right lower lobe nodule measuring 6 mm (image 33). Within the left lower lobe 3 mm nodule is unchanged.  Limited view of the upper abdomen demonstrates normal adrenal glands. No liver lesion on limited view the liver. Limited view of the skeleton demonstrates no aggressive osseous lesion.  IMPRESSION: 1. Dominant nodule in the right middle lobe not significant changed from prior. 2. New nodule in the right lower lobe measuring 6 mm. Recommend close attention on follow-up. 3. Stable nodular pleural parenchymal thickening at the right lung apex. 4. Subcarinal lymph node is similar.  No new mediastinal adenopathy.   Electronically Signed   By: Suzy Bouchard M.D.   On: 11/04/2013 09:01   ASSESSMENT/PLAN:  This is a very pleasant 80 years old white female was limited stage small cell lung cancer currently undergoing systemic chemotherapy with carboplatin and etoposide status post 4 cycles concurrent with radiation. The patient toleratied her treatment fairly well with no significant complaints except for the alopecia and fatigue in addition to sore throat and odynophagia from recent radiation.  I advised her to continue on Carafate for the odynophagia. Her recent CT scan of the chest showed no evidence for disease progression except for questionable nodule in the right lower lobe measuring 6 mm. I have a lengthy discussion with the patient and her husband today about her condition. In the absence of any further response to chemotherapy and more fatigue and weakness from the treatment, I recommended for the patient to continue on observation for now with repeat CT scan of the chest in 2 months for reevaluation of her disease. I also advised the patient to discuss with Dr. Sondra Come consideration of prophylactic cranial irradiation She was advised to call immediately if she has any concerning  symptoms in the interval. The patient and her husband agreed to the current plan. I spent 15 minutes of face-to-face counseling with the patient and her husband out of the total visit time 25 minutes. Disclaimer: This note was dictated with voice recognition software. Similar sounding words can inadvertently be transcribed and may not be corrected upon review. Eilleen Kempf., MD 11/05/2013

## 2013-11-05 NOTE — Telephone Encounter (Signed)
Gave pt appt for lab, md and flush for march

## 2013-11-06 ENCOUNTER — Ambulatory Visit: Payer: Medicare Other

## 2013-11-06 ENCOUNTER — Encounter: Payer: Medicare Other | Admitting: Nutrition

## 2013-11-07 ENCOUNTER — Ambulatory Visit: Payer: Medicare Other

## 2013-11-08 ENCOUNTER — Ambulatory Visit: Payer: Medicare Other

## 2013-11-12 ENCOUNTER — Other Ambulatory Visit: Payer: Medicare Other

## 2013-11-19 ENCOUNTER — Encounter: Payer: Self-pay | Admitting: Oncology

## 2013-11-19 ENCOUNTER — Other Ambulatory Visit: Payer: Medicare Other

## 2013-11-20 ENCOUNTER — Encounter: Payer: Self-pay | Admitting: Radiation Oncology

## 2013-11-20 ENCOUNTER — Ambulatory Visit
Admission: RE | Admit: 2013-11-20 | Discharge: 2013-11-20 | Disposition: A | Discharge status: Home / Self Care | Payer: Medicare Other | Source: Ambulatory Visit | Attending: Radiation Oncology | Admitting: Radiation Oncology

## 2013-11-20 VITALS — BP 104/47 | HR 107 | Temp 97.7°F | Ht 67.0 in | Wt 155.3 lb

## 2013-11-20 DIAGNOSIS — C3490 Malignant neoplasm of unspecified part of unspecified bronchus or lung: Secondary | ICD-10-CM

## 2013-11-20 NOTE — Progress Notes (Signed)
Radiation Oncology         [phone] ________________________________  Name: Elizabeth Humphrey MRN: [id number]  Date: 11/20/2013  DOB: 20-Jul-1934  Follow-Up Visit Note  CC: Geoffery Lyons, MD  Curt Bears, MD  Diagnosis:   Limited Stage small cell lung cancer  Interval Since Last Radiation:  4  weeks  Narrative:  The patient returns today for routine follow-up.  She continues to have some fatigue but is making good progress. She is able to eat some solid foods at this time.  She does have some mild dizziness with standing. On exam today she does have some orthostatic changes. I discussed consideration for IV fluids but the patient would like to force fluids at home. Her husband will stress the importance of this issue to her. She denies any breathing problems. She has very minimal pain with swallowing at this time.                              ALLERGIES:  has No Known Allergies.  Meds: Current Outpatient Prescriptions  Medication Sig Dispense Refill  . acetaminophen (TYLENOL) 325 MG tablet Take 650 mg by mouth every 6 (six) hours as needed (pain).      [name] HYDROcodone-acetaminophen (HYCET) 7.5-325 mg/15 ml solution Take 10 mLs by mouth 4 (four) times daily as needed for moderate pain.  120 mL  0  . lidocaine-prilocaine (EMLA) cream Apply to Port-A-Cath one to 2 hours prior to chemotherapy as directed  30 g  1  . polyethylene glycol (MIRALAX / GLYCOLAX) packet Take 17 g by mouth daily.      . potassium chloride SA (K-DUR,KLOR-CON) 20 MEQ tablet Take 1 tablet (20 mEq total) by mouth once. X 7 days  7 tablet  0  . prochlorperazine (COMPAZINE) 10 MG tablet TAKE 1 TABLET BY MOUTH EVERY SIX HOURS AS NEEDED FOR NAUSEA OR VOMITING.  30 tablet  1  . sucralfate (CARAFATE) 1 G tablet Take 1 tablet (1 g total) by mouth 4 (four) times daily -  with meals and at bedtime.  120 tablet  1  . traMADol (ULTRAM) 50 MG tablet Take 1 tablet by mouth every 6 (six) hours as needed (pain).       .  Alum & Mag Hydroxide-Simeth (MAGIC MOUTHWASH) SOLN Take 10 mLs by mouth 4 (four) times daily.  240 mL  2  . emollient (BIAFINE) cream Apply 1 application topically 2 (two) times daily. Apply to affected skin area once skin becomes irriatated or red,itching, after rad txs then daily and bedtime      . hydrocortisone acetate powder       . lactose free nutrition (BOOST PLUS) LIQD Take 237 mLs by mouth 2 (two) times daily between meals.  120 Can  3  . mupirocin ointment (BACTROBAN) 2 % Apply 1 application topically as needed. For cuts and scrapes      . simvastatin (ZOCOR) 40 MG tablet Take 40 mg by mouth at bedtime.       No current facility-administered medications for this encounter.    Physical Findings: The patient is in no acute distress. Patient is alert and oriented.  height is 5\' 7"  (1.702 m) and weight is 155 lb 4.8 oz (70.444 kg). Her temperature is 97.7 F (36.5 C). Her blood pressure is 104/47 and her pulse is 107. Her oxygen saturation is 100%. .  No palpable supraclavicular or axillary adenopathy. The oral cavity  is moist without secondary infection. The lungs are clear. The heart has a regular rhythm and rate.  Lab Findings: Lab Results  Component Value Date   WBC 6.6 11/05/2013   HGB 8.9* 11/05/2013   HCT 25.9* 11/05/2013   MCV 96.6 11/05/2013   PLT 57* 11/05/2013   Radiographic Findings: Ct Chest W Contrast  11/04/2013   CLINICAL DATA:  Restaging lung cancer. Chemotherapy in progress. Radiation therapy complete.  EXAM: CT CHEST WITH CONTRAST  TECHNIQUE: Multidetector CT imaging of the chest was performed during intravenous contrast administration.  CONTRAST:  61mL OMNIPAQUE IOHEXOL 300 MG/ML  SOLN  COMPARISON:  DG ABD ACUTE W/CHEST dated 10/04/2013; CT CHEST W/CM dated 09/19/2013; NM PET IMAGE INITIAL (PI) SKULL BASE TO THIGH dated 07/24/2013  FINDINGS: There is a port in the right chest wall. No axillary or supraclavicular lymphadenopathy. Subcarinal lymph node measuring 13 mm  compared to 14 mm on prior. No hilar adenopathy evident. No pericardial fluid. Esophagus is normal.  There is a band of parenchymal thickening in the right upper lobe (image 8, series 5) not changed from prior. Right upper lobe nodule measures 6 mm (image 13) not changed from prior. Within the right middle lobe 20 mm x 9 mm nodule compares to 18 mm x 14 mm nodule on prior for no significant change. There is a new right lower lobe nodule measuring 6 mm (image 33). Within the left lower lobe 3 mm nodule is unchanged.  Limited view of the upper abdomen demonstrates normal adrenal glands. No liver lesion on limited view the liver. Limited view of the skeleton demonstrates no aggressive osseous lesion.  IMPRESSION: 1. Dominant nodule in the right middle lobe not significant changed from prior. 2. New nodule in the right lower lobe measuring 6 mm. Recommend close attention on follow-up. 3. Stable nodular pleural parenchymal thickening at the right lung apex. 4. Subcarinal lymph node is similar.  No new mediastinal adenopathy.   Electronically Signed   By: Suzy Bouchard M.D.   On: 11/04/2013 09:01    Impression:  The patient is recovering from the effects of radiation.    Plan:  The patient will undergo repeat CT scans of the body on April 22 and followup with medical oncology April 23. I will see the patient after Dr. Worthy Flank for appointment the same day. We briefly discussed prophylactic cranial irradiation but would be somewhat hesitant and recommending this with the patient's age of 85.  She will need brain imaging for further evaluation of this issue in April.  ____________________________________ Blair Promise, MD

## 2013-11-20 NOTE — Progress Notes (Signed)
Elizabeth Humphrey here for follow up after treatment to her central chest.  She denies pain.  She reports her throat pain has improved.  She still has some burning.  She requests a refill on her hycet.  She denies fatigue.  She does report dizziness when she first stands up.  Orthostatics done: bp 104/47, hr 107, standing 80/65, hr 116.  She reports she has nausea and throws up every 2-3 days.  She says the compazine does not help and makes her throw up.  She has lost 1 lb since 2/25.  She reports she is trying to drink and eat.  She denies hymoptysis and shortness of breath.  She reports her fatigue is improving.  She reports her skin is intact in the treatment area.

## 2013-11-26 ENCOUNTER — Other Ambulatory Visit: Payer: Medicare Other

## 2013-11-26 ENCOUNTER — Ambulatory Visit: Payer: Medicare Other

## 2013-11-27 ENCOUNTER — Ambulatory Visit: Payer: Medicare Other

## 2013-11-28 ENCOUNTER — Ambulatory Visit: Payer: Medicare Other

## 2013-11-29 ENCOUNTER — Ambulatory Visit: Payer: Medicare Other

## 2013-12-03 ENCOUNTER — Other Ambulatory Visit: Payer: Medicare Other

## 2013-12-05 ENCOUNTER — Telehealth: Payer: Self-pay | Admitting: Dietician

## 2013-12-05 NOTE — Telephone Encounter (Signed)
Brief Outpatient Oncology Nutrition Note  Patient has been identified to be at risk on malnutrition screen.  Wt Readings from Last 10 Encounters:  11/20/13 155 lb 4.8 oz (70.444 kg)  11/05/13 156 lb 6.4 oz (70.943 kg)  10/15/13 165 lb 4.8 oz (74.98 kg)  10/14/13 165 lb 11.2 oz (75.161 kg)  10/05/13 170 lb (77.111 kg)  09/30/13 174 lb 8 oz (79.153 kg)  09/23/13 182 lb 3.2 oz (82.645 kg)  09/16/13 181 lb 1.6 oz (82.146 kg)  09/10/13 179 lb 3.2 oz (81.285 kg)  09/09/13 178 lb 11.2 oz (81.058 kg)    Dx:  Small cell lung cancer.  Patient of Dr. Julien Nordmann.  Called patient secondary to weight loss of aproximately 5 lbs in the past month.  Spoke with patient's husband who stated that patient is eating much better and that swallowing problems have significantly improved.  He is encouraging her to take a protein shake.  Encouraged them to call for any nutrition related needs or questions.  [place] RD available as needed.  Antonieta Iba, RD, LDN

## 2013-12-24 DIAGNOSIS — Z780 Asymptomatic menopausal state: Secondary | ICD-10-CM | POA: Diagnosis not present

## 2013-12-31 ENCOUNTER — Ambulatory Visit (HOSPITAL_COMMUNITY)
Admission: RE | Admit: 2013-12-31 | Discharge: 2013-12-31 | Disposition: A | Discharge status: Home / Self Care | Payer: Medicare Other | Source: Ambulatory Visit | Attending: Internal Medicine | Admitting: Internal Medicine

## 2013-12-31 ENCOUNTER — Encounter (HOSPITAL_COMMUNITY): Payer: Self-pay

## 2013-12-31 ENCOUNTER — Other Ambulatory Visit (HOSPITAL_BASED_OUTPATIENT_CLINIC_OR_DEPARTMENT_OTHER): Payer: Medicare Other

## 2013-12-31 DIAGNOSIS — IMO0002 Reserved for concepts with insufficient information to code with codable children: Secondary | ICD-10-CM | POA: Diagnosis not present

## 2013-12-31 DIAGNOSIS — R911 Solitary pulmonary nodule: Secondary | ICD-10-CM | POA: Diagnosis not present

## 2013-12-31 DIAGNOSIS — C342 Malignant neoplasm of middle lobe, bronchus or lung: Secondary | ICD-10-CM

## 2013-12-31 DIAGNOSIS — R918 Other nonspecific abnormal finding of lung field: Secondary | ICD-10-CM | POA: Diagnosis not present

## 2013-12-31 DIAGNOSIS — C3490 Malignant neoplasm of unspecified part of unspecified bronchus or lung: Secondary | ICD-10-CM | POA: Diagnosis not present

## 2013-12-31 DIAGNOSIS — M1990 Unspecified osteoarthritis, unspecified site: Secondary | ICD-10-CM | POA: Diagnosis not present

## 2013-12-31 DIAGNOSIS — I2510 Atherosclerotic heart disease of native coronary artery without angina pectoris: Secondary | ICD-10-CM | POA: Diagnosis not present

## 2013-12-31 DIAGNOSIS — E785 Hyperlipidemia, unspecified: Secondary | ICD-10-CM | POA: Diagnosis not present

## 2013-12-31 DIAGNOSIS — R7301 Impaired fasting glucose: Secondary | ICD-10-CM | POA: Diagnosis not present

## 2013-12-31 LAB — CBC WITH DIFFERENTIAL/PLATELET
BASO%: 0.4 % (ref 0.0–2.0)
Basophils Absolute: 0 10*3/uL (ref 0.0–0.1)
EOS ABS: 0.1 10*3/uL (ref 0.0–0.5)
EOS%: 2.3 % (ref 0.0–7.0)
HEMATOCRIT: 29.8 % — AB (ref 34.8–46.6)
HEMOGLOBIN: 10 g/dL — AB (ref 11.6–15.9)
LYMPH%: 12.5 % — ABNORMAL LOW (ref 14.0–49.7)
MCH: 34.1 pg — ABNORMAL HIGH (ref 25.1–34.0)
MCHC: 33.6 g/dL (ref 31.5–36.0)
MCV: 101.7 fL — AB (ref 79.5–101.0)
MONO#: 0.6 10*3/uL (ref 0.1–0.9)
MONO%: 11.2 % (ref 0.0–14.0)
NEUT#: 3.7 10*3/uL (ref 1.5–6.5)
NEUT%: 73.6 % (ref 38.4–76.8)
Platelets: 127 10*3/uL — ABNORMAL LOW (ref 145–400)
RBC: 2.93 10*6/uL — ABNORMAL LOW (ref 3.70–5.45)
RDW: 16.7 % — AB (ref 11.2–14.5)
WBC: 5.1 10*3/uL (ref 3.9–10.3)
lymph#: 0.6 10*3/uL — ABNORMAL LOW (ref 0.9–3.3)

## 2013-12-31 LAB — COMPREHENSIVE METABOLIC PANEL (CC13)
ALBUMIN: 3.4 g/dL — AB (ref 3.5–5.0)
ALT: 9 U/L (ref 0–55)
AST: 18 U/L (ref 5–34)
Alkaline Phosphatase: 61 U/L (ref 40–150)
Anion Gap: 8 mEq/L (ref 3–11)
BUN: 18.3 mg/dL (ref 7.0–26.0)
CALCIUM: 9.5 mg/dL (ref 8.4–10.4)
CHLORIDE: 106 meq/L (ref 98–109)
CO2: 23 mEq/L (ref 22–29)
Creatinine: 0.7 mg/dL (ref 0.6–1.1)
Glucose: 110 mg/dl (ref 70–140)
POTASSIUM: 3.9 meq/L (ref 3.5–5.1)
Sodium: 138 mEq/L (ref 136–145)
Total Bilirubin: 0.43 mg/dL (ref 0.20–1.20)
Total Protein: 7.1 g/dL (ref 6.4–8.3)

## 2013-12-31 MED ORDER — IOHEXOL 300 MG/ML  SOLN
80.0000 mL | Freq: Once | INTRAMUSCULAR | Status: AC | PRN
  Administered 2013-12-31: 80 mL via INTRAVENOUS

## 2014-01-01 ENCOUNTER — Encounter: Payer: Self-pay | Admitting: Internal Medicine

## 2014-01-01 ENCOUNTER — Other Ambulatory Visit: Payer: Medicare Other

## 2014-01-01 ENCOUNTER — Ambulatory Visit (HOSPITAL_BASED_OUTPATIENT_CLINIC_OR_DEPARTMENT_OTHER): Payer: Medicare Other

## 2014-01-01 ENCOUNTER — Telehealth: Payer: Self-pay | Admitting: Internal Medicine

## 2014-01-01 ENCOUNTER — Ambulatory Visit (HOSPITAL_BASED_OUTPATIENT_CLINIC_OR_DEPARTMENT_OTHER): Payer: Medicare Other | Admitting: Internal Medicine

## 2014-01-01 VITALS — BP 95/53 | HR 104 | Temp 98.4°F | Resp 20 | Ht 67.0 in | Wt 156.0 lb

## 2014-01-01 DIAGNOSIS — J984 Other disorders of lung: Secondary | ICD-10-CM

## 2014-01-01 DIAGNOSIS — Z95828 Presence of other vascular implants and grafts: Secondary | ICD-10-CM

## 2014-01-01 DIAGNOSIS — C342 Malignant neoplasm of middle lobe, bronchus or lung: Secondary | ICD-10-CM | POA: Diagnosis not present

## 2014-01-01 DIAGNOSIS — C3490 Malignant neoplasm of unspecified part of unspecified bronchus or lung: Secondary | ICD-10-CM

## 2014-01-01 MED ORDER — HEPARIN SOD (PORK) LOCK FLUSH 100 UNIT/ML IV SOLN
500.0000 [IU] | Freq: Once | INTRAVENOUS | Status: AC
  Administered 2014-01-01: 500 [IU] via INTRAVENOUS
  Filled 2014-01-01: qty 5

## 2014-01-01 MED ORDER — SODIUM CHLORIDE 0.9 % IJ SOLN
10.0000 mL | INTRAMUSCULAR | Status: DC | PRN
  Administered 2014-01-01: 10 mL via INTRAVENOUS
  Filled 2014-01-01: qty 10

## 2014-01-01 NOTE — Telephone Encounter (Signed)
gv adn printed appt shced and avs for pt for May June/July and Aug....pt sched to see Dr. Sondra Come on 5.6.15 @ 10:30am

## 2014-01-01 NOTE — Patient Instructions (Signed)

## 2014-01-01 NOTE — Progress Notes (Signed)
[facility]  Telephone:([phone]) [phone] Fax:(336) [phone]  OFFICE PROGRESS NOTE   Geoffery Lyons, MD [place], [place]. [place] [address]  DIAGNOSIS: Small cell lung carcinoma, limited stage   Primary site: Lung (Right)   Staging method: AJCC 7th Edition   Clinical: Stage IIIA (T2a, N2, M0) signed by Curt Bears, MD on 08/06/2013  3:32 PM   Summary: Stage IIIA (T2a, N2, M0)  PRIOR THERAPY: None  CURRENT THERAPY: Systemic chemotherapy with carboplatin for an AUC of 5 given on day 1 and etoposide 120 mg per meter squared on days 1, 2 and 3 with Neulasta support given on day 4. This given concurrent with radiation. Status post 4 cycles  DISEASE STAGE: Small cell lung carcinoma, limited stage   Primary site: Lung (Right)   Staging method: AJCC 7th Edition   Clinical: Stage IIIA (T2a, N2, M0) signed by Curt Bears, MD on 08/06/2013  3:32 PM   Summary: Stage IIIA (T2a, N2, M0)  CHEMOTHERAPY INTENT: curative  CURRENT # OF CHEMOTHERAPY CYCLES: 4  CURRENT ANTIEMETICS: Zofran, dexamethasone and Compazine  CURRENT SMOKING STATUS: Former smoker, quit 09/12/1991  ORAL CHEMOTHERAPY AND CONSENT: N/A  CURRENT BISPHOSPHONATES USE: None  PAIN MANAGEMENT: Hydrocodone and acetaminophen, tramadol  NARCOTICS INDUCED CONSTIPATION: Mild  LIVING WILL AND CODE STATUS: ?  INTERVAL HISTORY: Elizabeth Humphrey 80 y.o. female returns for followup visit accompanied by her husband and daughter.  She has improvement in the dysphagia and odynophagia since her last visit. She continues to have mild fatigue. She denied having any significant chest pain but has shortness of breath with exertion with no cough or hemoptysisThe patient denied having any significant weight loss or night sweats. She denied having any nausea or vomiting, no fever or chills. She had repeat CT scan of the chest performed recently and she is here for evaluation and  discussion of her scan results.  MEDICAL HISTORY: Past Medical History  Diagnosis Date  . Allergy     bee stings/anaphylaxis  . Hyperlipidemia     hx of  . COPD (chronic obstructive pulmonary disease) 07/30/13  . Heart murmur   . History of pneumonia   . Arthritis   . Pyelonephritis   . Cough   . Lung cancer dx'd 07/2013  . History of radiation therapy 08/26/13-10/20/13    59.4 gray to central chest area    ALLERGIES:  has No Known Allergies.  MEDICATIONS:  Current Outpatient Prescriptions  Medication Sig Dispense Refill  . acetaminophen (TYLENOL) 325 MG tablet Take 650 mg by mouth every 6 (six) hours as needed (pain).      [name] lidocaine-prilocaine (EMLA) cream Apply to Port-A-Cath one to 2 hours prior to chemotherapy as directed  30 g  1  . mupirocin ointment (BACTROBAN) 2 % Apply 1 application topically as needed. For cuts and scrapes      . simvastatin (ZOCOR) 40 MG tablet Take 40 mg by mouth at bedtime.      . traMADol (ULTRAM) 50 MG tablet Take 1 tablet by mouth every 6 (six) hours as needed (pain).       . polyethylene glycol (MIRALAX / GLYCOLAX) packet Take 17 g by mouth daily.       No current facility-administered medications for this visit.   Facility-Administered Medications Ordered in Other Visits  Medication Dose Route Frequency Provider Last Rate Last Dose  . heparin lock flush 100 unit/mL  500 Units Intravenous Once Curt Bears, MD      .  sodium chloride 0.9 % injection 10 mL  10 mL Intravenous PRN Curt Bears, MD        SURGICAL HISTORY:  Past Surgical History  Procedure Laterality Date  . Dilation and curettage of uterus  yrs ago  . Tonsillectomy    . Cataract extraction      both eyes  . Video bronchoscopy with endobronchial ultrasound N/A 08/04/2013    Procedure: VIDEO BRONCHOSCOPY WITH ENDOBRONCHIAL ULTRASOUND;  Surgeon: Melrose Nakayama, MD;  Location: [facility];  Service: Thoracic;  Laterality: N/A;    REVIEW OF SYSTEMS:  Constitutional:  positive for fatigue Eyes: negative Ears, nose, mouth, throat, and face: negative Respiratory: negative Cardiovascular: negative Gastrointestinal: positive for constipation Genitourinary:positive for urinary incontinence Integument/breast: negative Hematologic/lymphatic: negative Musculoskeletal:negative Neurological: negative Behavioral/Psych: negative Endocrine: negative Allergic/Immunologic: negative   PHYSICAL EXAMINATION: General appearance: alert, cooperative, appears stated age and no distress Head: Normocephalic, without obvious abnormality, atraumatic Neck: no adenopathy, no carotid bruit, no JVD, supple, symmetrical, trachea midline and thyroid not enlarged, symmetric, no tenderness/mass/nodules Lymph nodes: Cervical, supraclavicular, and axillary nodes normal. Resp: clear to auscultation bilaterally Back: symmetric, no curvature. ROM normal. No CVA tenderness. Cardio: regular rate and rhythm, S1, S2 normal, no murmur, click, rub or gallop GI: soft, non-tender; bowel sounds normal; no masses,  no organomegaly Extremities: extremities normal, atraumatic, no cyanosis or edema Neurologic: Alert and oriented X 3, normal strength and tone. Normal symmetric reflexes. Normal coordination and gait  ECOG PERFORMANCE STATUS: 1 - Symptomatic but completely ambulatory  Blood pressure 95/53, pulse 104, temperature 98.4 F (36.9 C), temperature source Oral, resp. rate 20, height 5\' 7"  (1.702 m), weight 156 lb (70.761 kg).  LABORATORY DATA: Lab Results  Component Value Date   WBC 5.1 12/31/2013   HGB 10.0* 12/31/2013   HCT 29.8* 12/31/2013   MCV 101.7* 12/31/2013   PLT 127* 12/31/2013      Chemistry      Component Value Date/Time   NA 138 12/31/2013 0927   NA 146 10/06/2013 0625   K 3.9 12/31/2013 0927   K 4.0 10/06/2013 0625   CL 108 10/06/2013 0625   CO2 23 12/31/2013 0927   CO2 23 10/06/2013 0625   BUN 18.3 12/31/2013 0927   BUN 42* 10/06/2013 0625   CREATININE 0.7 12/31/2013 0927    CREATININE 0.92 10/06/2013 0625      Component Value Date/Time   CALCIUM 9.5 12/31/2013 0927   CALCIUM 9.0 10/06/2013 0625   ALKPHOS 61 12/31/2013 0927   ALKPHOS 59 10/06/2013 0625   AST 18 12/31/2013 0927   AST 10 10/06/2013 0625   ALT 9 12/31/2013 0927   ALT 10 10/06/2013 0625   BILITOT 0.43 12/31/2013 0927   BILITOT 0.5 10/06/2013 0625       RADIOGRAPHIC STUDIES: Ct Chest W Contrast  12/31/2013   CLINICAL DATA:  Lung cancer, approximately 8 weeks status post completion of radiation therapy.  EXAM: CT CHEST WITH CONTRAST  TECHNIQUE: Multidetector CT imaging of the chest was performed during intravenous contrast administration.  CONTRAST:  35mL OMNIPAQUE IOHEXOL 300 MG/ML  SOLN  COMPARISON:  CT CHEST W/CM dated 11/04/2013; CT CHEST W/CM dated 09/19/2013  FINDINGS: The tip of the right-sided Port-A-Cath projects at the junction of the SVC and RA.  There is no axillary lymphadenopathy. No mediastinal or hilar lymphadenopathy. Small scattered lymph nodes are seen in the mediastinum. The heart size is normal. Coronary artery calcification is noted. There is no pericardial or pleural effusion.  Linear band of  pleural parenchymal opacity in the right apex is unchanged. Posterior right upper lobe 6 mm pulmonary nodule is stable  The spiculated right middle lobe nodule measures 10 x 19 mm today compared to 9 x 20 mm previously.  3 mm left lower lobe pulmonary nodule measured previously is stable at 3 mm on image 40 today. A second smaller left lower lobe nodule is unchanged.  Since the prior study, the patient has developed interstitial and alveolar opacity involving the posterior right upper lobe and central right lower lobe.  Images which include the upper abdomen show no evidence for an adrenal nodule or mass although neither adrenal gland is been incompletely visualized. A small hypo attenuating lesion in the spleen is unchanged.  Bone windows reveal no worrisome lytic or sclerotic osseous lesions.  IMPRESSION:  No substantial change in the spiculated right middle lobe mass.  Interval development of interstitial and alveolar opacity having a central predominance and involving the upper, middle, and lower lobes of the right lung. Although more diffuse than typically seen for a focal treatment port, radiation pneumonitis would be a consideration given the ipsilateral nature of the disease. Alternatively, diffuse infectious/inflammatory alveolitis of the right lung could also have this appearance.   Electronically Signed   By: Misty Stanley M.D.   On: 12/31/2013 12:27   ASSESSMENT/PLAN:  This is a very pleasant 80 years old white female was limited stage small cell lung cancer currently undergoing systemic chemotherapy with carboplatin and etoposide status post 4 cycles concurrent with radiation.  Her recent CT scan of the chest showed no evidence for disease progression but the patient developed interval interstitial and alveolar opacity involving the right lung most likely secondary to radiation pneumonitis. I discussed the scan results with the patient and her family and they recommended for her to continue on observation with repeat CT scan of the chest in 3 months. I also advised the patient to discuss with Dr. Sondra Come consideration of prophylactic cranial irradiation She was advised to call immediately if she has any concerning symptoms in the interval. The patient and her husband agreed to the current plan.  Disclaimer: This note was dictated with voice recognition software. Similar sounding words can inadvertently be transcribed and may not be corrected upon review. Curt Bears, MD 01/01/2014

## 2014-01-09 NOTE — Progress Notes (Signed)
Reconsult for prophylactic cranial irradiation.  Patient has had radiation on 08/26/13-10/20/13 to 59.4 gray to central chest area.  Currently undergoing systemic chemotherapy with carboplatin and etoposide status post 4 cycles concurrent with radiation.  Last chemotherapy 2 months ago.  Patient's bp today was 93/56, hr 80.  Patient has lost 3 lbs since 4/23.  She states she likes to be skinny and does not want to gain her weight back.  She reports bruising easily.  She reports her swallowing is better.  She reports fatigue and takes naps everyday.

## 2014-01-14 ENCOUNTER — Encounter: Payer: Self-pay | Admitting: Radiation Oncology

## 2014-01-14 ENCOUNTER — Telehealth: Payer: Self-pay | Admitting: Oncology

## 2014-01-14 ENCOUNTER — Telehealth: Payer: Self-pay | Admitting: *Deleted

## 2014-01-14 ENCOUNTER — Ambulatory Visit
Admission: RE | Admit: 2014-01-14 | Discharge: 2014-01-14 | Disposition: A | Discharge status: Home / Self Care | Payer: Medicare Other | Source: Ambulatory Visit | Attending: Radiation Oncology | Admitting: Radiation Oncology

## 2014-01-14 VITALS — BP 93/56 | HR 80 | Temp 98.9°F | Ht 67.0 in | Wt 153.3 lb

## 2014-01-14 DIAGNOSIS — Z79899 Other long term (current) drug therapy: Secondary | ICD-10-CM | POA: Insufficient documentation

## 2014-01-14 DIAGNOSIS — C3490 Malignant neoplasm of unspecified part of unspecified bronchus or lung: Secondary | ICD-10-CM | POA: Diagnosis not present

## 2014-01-14 DIAGNOSIS — C342 Malignant neoplasm of middle lobe, bronchus or lung: Secondary | ICD-10-CM | POA: Diagnosis not present

## 2014-01-14 MED ORDER — LORAZEPAM 0.5 MG PO TABS: 0.5000 mg | ORAL_TABLET | Freq: Once | ORAL | Status: DC

## 2014-01-14 NOTE — Telephone Encounter (Signed)
Called patient to inform of test,no answer, will call later

## 2014-01-14 NOTE — Progress Notes (Signed)
Please see the Nurse Progress Note in the MD Initial Consult Encounter for this patient. 

## 2014-01-14 NOTE — Progress Notes (Deleted)
Please see the Nurse Progress Note in the MD Initial Consult Encounter for this patient. 

## 2014-01-14 NOTE — Progress Notes (Signed)
Radiation Oncology         [phone] ________________________________  Name: Elizabeth Humphrey MRN: [id number]  Date: 01/14/2014  DOB: December 15, 1933  Follow-Up Visit Note  CC: Geoffery Lyons, MD  Curt Bears, MD  Diagnosis:   Small cell lung carcinoma   Primary site: Lung (Right)   Staging method: AJCC 7th Edition   Clinical: Stage IIIA (T2a, N2, M0)    Summary: Stage IIIA (T2a, N2, M0)   Interval Since Last Radiation:  3  months,  she completed 59.4 gray directed at the central chest area  Narrative:  The patient returns today for routine follow-up.  She is feeling well at this time. She denies any further problems with swallowing.  She continues to have some fatigue. She is back walking approximately half mile per day. She denies any headaches visual problems new bony pain. She denies any breathing problems or pain within the chest area. She denies any significant cough or hemoptysis.                              ALLERGIES:  has No Known Allergies.  Meds: Current Outpatient Prescriptions  Medication Sig Dispense Refill  . acetaminophen (TYLENOL) 325 MG tablet Take 650 mg by mouth every 6 (six) hours as needed (pain).      . cholecalciferol (VITAMIN D) 1000 UNITS tablet Take 1,000 Units by mouth daily.      . mupirocin ointment (BACTROBAN) 2 % Apply 1 application topically as needed. For cuts and scrapes      . polyethylene glycol (MIRALAX / GLYCOLAX) packet Take 17 g by mouth daily.      . simvastatin (ZOCOR) 40 MG tablet Take 40 mg by mouth at bedtime.      . traMADol (ULTRAM) 50 MG tablet Take 1 tablet by mouth every 6 (six) hours as needed (pain).       [name] lidocaine-prilocaine (EMLA) cream Apply to Port-A-Cath one to 2 hours prior to chemotherapy as directed  30 g  1  . LORazepam (ATIVAN) 0.5 MG tablet Take 1 tablet (0.5 mg total) by mouth once.  3 tablet  0   No current facility-administered medications for this encounter.    Physical Findings: The patient is in  no acute distress. Patient is alert and oriented.  height is 5\' 7"  (1.702 m) and weight is 153 lb 4.8 oz (69.536 kg). Her temperature is 98.9 F (37.2 C). Her blood pressure is 93/56 and her pulse is 80. Her oxygen saturation is 97%. .  Examination of the neck and supraclavicular region reveals no evidence of adenopathy. The axillary areas are free of adenopathy examination of the lungs reveals them to be clear. The heart has a regular rhythm and rate. The neurological examination is nonfocal.  Lab Findings: Lab Results  Component Value Date   WBC 5.1 12/31/2013   HGB 10.0* 12/31/2013   HCT 29.8* 12/31/2013   MCV 101.7* 12/31/2013   PLT 127* 12/31/2013      Radiographic Findings: Ct Chest W Contrast  12/31/2013   CLINICAL DATA:  Lung cancer, approximately 8 weeks status post completion of radiation therapy.  EXAM: CT CHEST WITH CONTRAST  TECHNIQUE: Multidetector CT imaging of the chest was performed during intravenous contrast administration.  CONTRAST:  49mL OMNIPAQUE IOHEXOL 300 MG/ML  SOLN  COMPARISON:  CT CHEST W/CM dated 11/04/2013; CT CHEST W/CM dated 09/19/2013  FINDINGS: The tip of the right-sided Port-A-Cath projects at  the junction of the SVC and RA.  There is no axillary lymphadenopathy. No mediastinal or hilar lymphadenopathy. Small scattered lymph nodes are seen in the mediastinum. The heart size is normal. Coronary artery calcification is noted. There is no pericardial or pleural effusion.  Linear band of pleural parenchymal opacity in the right apex is unchanged. Posterior right upper lobe 6 mm pulmonary nodule is stable  The spiculated right middle lobe nodule measures 10 x 19 mm today compared to 9 x 20 mm previously.  3 mm left lower lobe pulmonary nodule measured previously is stable at 3 mm on image 40 today. A second smaller left lower lobe nodule is unchanged.  Since the prior study, the patient has developed interstitial and alveolar opacity involving the posterior right upper lobe  and central right lower lobe.  Images which include the upper abdomen show no evidence for an adrenal nodule or mass although neither adrenal gland is been incompletely visualized. A small hypo attenuating lesion in the spleen is unchanged.  Bone windows reveal no worrisome lytic or sclerotic osseous lesions.  IMPRESSION: No substantial change in the spiculated right middle lobe mass.  Interval development of interstitial and alveolar opacity having a central predominance and involving the upper, middle, and lower lobes of the right lung. Although more diffuse than typically seen for a focal treatment port, radiation pneumonitis would be a consideration given the ipsilateral nature of the disease. Alternatively, diffuse infectious/inflammatory alveolitis of the right lung could also have this appearance.   Electronically Signed   By: Misty Stanley M.D.   On: 12/31/2013 12:27    Impression:  Small cell lung cancer, limited stage. The patient appears to be in remission at this time. I discussed consideration for prophylactic cranial irradiation with the patient and her husband. We discussed the controversy of this treatment  given the patient's age of 72. At This time the patient is undecided whether she would like to proceed with prophylactic cranial radiation given the potential issues with dementia and memory loss.  Plan:  MRI of the brain in the near future. Patient will be contacted with the results of this study to determine whether she would like to proceed with prophylactic cranial radiation or therapeutic treatment if her MRI of  the brain shows metastasis.  ____________________________________ Blair Promise, MD

## 2014-01-14 NOTE — Telephone Encounter (Signed)
Called in to [facility] per Dr. Sondra Come  - LORazepam (ATIVAN) 0.5 MG tablet 3 tablet 0 01/14/2014 Sig - Route: Take 1 tablet (0.5 mg total) by mouth once. Take 1 hour prior to MRI. Disp. 3 tablets. 0 Refills.

## 2014-01-16 ENCOUNTER — Telehealth: Payer: Self-pay | Admitting: *Deleted

## 2014-01-16 NOTE — Telephone Encounter (Signed)
CALLED PATIENT TO INFORM OF TEST ON 01-22-14, SPOKE WITH PATIENT AND SHE IS AWARE OF THIS TEST.

## 2014-01-22 ENCOUNTER — Ambulatory Visit (HOSPITAL_COMMUNITY)
Admission: RE | Admit: 2014-01-22 | Discharge: 2014-01-22 | Disposition: A | Discharge status: Home / Self Care | Payer: Medicare Other | Source: Ambulatory Visit | Attending: Radiation Oncology | Admitting: Radiation Oncology

## 2014-01-22 DIAGNOSIS — C3490 Malignant neoplasm of unspecified part of unspecified bronchus or lung: Secondary | ICD-10-CM | POA: Diagnosis not present

## 2014-01-22 DIAGNOSIS — G319 Degenerative disease of nervous system, unspecified: Secondary | ICD-10-CM | POA: Insufficient documentation

## 2014-01-22 MED ORDER — GADOBENATE DIMEGLUMINE 529 MG/ML IV SOLN
15.0000 mL | Freq: Once | INTRAVENOUS | Status: AC | PRN
  Administered 2014-01-22: 15 mL via INTRAVENOUS

## 2014-01-26 DIAGNOSIS — Z79899 Other long term (current) drug therapy: Secondary | ICD-10-CM | POA: Diagnosis not present

## 2014-01-26 DIAGNOSIS — E785 Hyperlipidemia, unspecified: Secondary | ICD-10-CM | POA: Diagnosis not present

## 2014-01-26 DIAGNOSIS — M949 Disorder of cartilage, unspecified: Secondary | ICD-10-CM | POA: Diagnosis not present

## 2014-01-26 DIAGNOSIS — Z0000 Encounter for general adult medical examination without abnormal findings: Secondary | ICD-10-CM | POA: Diagnosis not present

## 2014-01-26 DIAGNOSIS — M4800 Spinal stenosis, site unspecified: Secondary | ICD-10-CM | POA: Diagnosis not present

## 2014-01-26 DIAGNOSIS — M899 Disorder of bone, unspecified: Secondary | ICD-10-CM | POA: Diagnosis not present

## 2014-01-26 NOTE — Progress Notes (Signed)
Histology and Location of Primary Cancer: Small cell lung carcinoma Primary site: Lung (Right)  Reconsult for prophylactic cranial radiation   Past/Anticipated chemotherapy by medical oncology, if any: Systemic chemotherapy with carboplatin for an AUC of 5 given on day 1 and etoposide 120 mg per meter squared on days 1, 2 and 3 with Neulasta support given on day 4. This given concurrent with radiation. Status post 4 cycles  Pain on a scale of 0-10 is: 0  SAFETY ISSUES: Prior radiation? 08/26/13-10/20/13 to 59.4 gray to central chest area.  Pacemaker/ICD? no  Possible current pregnancy? no  Is the patient on methotrexate? no  Additional Complaints / other details:  Here with her husband and daughter.  She reports that she is not eating very much.  She has lost 4 lbs since 01/14/14.  Her bp today sitting  is 92/52, hr 89, bp standing 83/53, hr 90.  She reports feeling dizzy when standing.  She denies coughing and shortness of breath.

## 2014-01-27 ENCOUNTER — Ambulatory Visit
Admission: RE | Admit: 2014-01-27 | Discharge: 2014-01-27 | Disposition: A | Discharge status: Home / Self Care | Payer: Medicare Other | Source: Ambulatory Visit | Attending: Radiation Oncology | Admitting: Radiation Oncology

## 2014-01-27 ENCOUNTER — Encounter: Payer: Self-pay | Admitting: Radiation Oncology

## 2014-01-27 VITALS — BP 92/52 | HR 89 | Temp 98.3°F | Ht 67.0 in | Wt 149.1 lb

## 2014-01-27 DIAGNOSIS — Z79899 Other long term (current) drug therapy: Secondary | ICD-10-CM | POA: Diagnosis not present

## 2014-01-27 DIAGNOSIS — C3490 Malignant neoplasm of unspecified part of unspecified bronchus or lung: Secondary | ICD-10-CM

## 2014-01-27 DIAGNOSIS — C3410 Malignant neoplasm of upper lobe, unspecified bronchus or lung: Secondary | ICD-10-CM | POA: Diagnosis not present

## 2014-01-27 NOTE — Progress Notes (Signed)
Radiation Oncology         [phone]) [phone] ________________________________  Name: Elizabeth Humphrey MRN: [id number]  Date: 01/27/2014  DOB: 1934-08-06  Reevaluation Note  CC: Geoffery Lyons, MD  Curt Bears, MD  Diagnosis: Small cell lung carcinoma   Primary site: Lung (Right)   Staging method: AJCC 7th Edition   Clinical: Stage IIIA (T2a, N2, M0)    Summary: Stage IIIA (T2a, N2, M0)   Interval Since Last Radiation:  3 1/2   months, the patient completed radiation treatments to the chest totally 59.4 gray as part of her management for limited stage small cell lung cancer.  Narrative:  The patient returns today for further evaluation. She recently completed a MRI the brain showed no evidence of metastasis. She was noted to have mild progression of cerebral white matter changes possibly related to chronic microvascular ischemia or possibly chemotherapy.  Interval history is significant for the patient being diagnosed mild depression by her primary care physician team. She is recently been placed on medication for this issue.  Patient is accompanied by her husband and daughter on evaluation today.                            ALLERGIES:  has No Known Allergies.  Meds: Current Outpatient Prescriptions  Medication Sig Dispense Refill  . acetaminophen (TYLENOL) 325 MG tablet Take 650 mg by mouth every 6 (six) hours as needed (pain).      . cholecalciferol (VITAMIN D) 1000 UNITS tablet Take 1,000 Units by mouth daily.      [name] escitalopram (LEXAPRO) 10 MG tablet Take 10 mg by mouth daily. Taking 1/2 tablet for the first 8 days then will take a whole tablet.      [name] LORazepam (ATIVAN) 0.5 MG tablet Take 1 tablet (0.5 mg total) by mouth once.  3 tablet  0  . mupirocin ointment (BACTROBAN) 2 % Apply 1 application topically as needed. For cuts and scrapes      . polyethylene glycol (MIRALAX / GLYCOLAX) packet Take 17 g by mouth daily.      . simvastatin (ZOCOR) 40 MG tablet Take 40 mg by  mouth at bedtime.      . lidocaine-prilocaine (EMLA) cream Apply to Port-A-Cath one to 2 hours prior to chemotherapy as directed  30 g  1  . traMADol (ULTRAM) 50 MG tablet Take 1 tablet by mouth every 6 (six) hours as needed (pain).        No current facility-administered medications for this encounter.    Physical Findings: The patient is in no acute distress. Patient is alert and oriented.  height is 5\' 7"  (1.702 m) and weight is 149 lb 1.6 oz (67.631 kg). Her oral temperature is 98.3 F (36.8 C). Her blood pressure is 92/52 and her pulse is 89. Her oxygen saturation is 98%. .  No palpable supraclavicular or axillary adenopathy. The lungs are clear to auscultation. The heart has regular rhythm and rate. The neurological exam is nonfocal.  Lab Findings: Lab Results  Component Value Date   WBC 5.1 12/31/2013   HGB 10.0* 12/31/2013   HCT 29.8* 12/31/2013   MCV 101.7* 12/31/2013   PLT 127* 12/31/2013      Radiographic Findings: Ct Chest W Contrast  12/31/2013   CLINICAL DATA:  Lung cancer, approximately 8 weeks status post completion of radiation therapy.  EXAM: CT CHEST WITH CONTRAST  TECHNIQUE: Multidetector CT imaging of the chest  was performed during intravenous contrast administration.  CONTRAST:  8mL OMNIPAQUE IOHEXOL 300 MG/ML  SOLN  COMPARISON:  CT CHEST W/CM dated 11/04/2013; CT CHEST W/CM dated 09/19/2013  FINDINGS: The tip of the right-sided Port-A-Cath projects at the junction of the SVC and RA.  There is no axillary lymphadenopathy. No mediastinal or hilar lymphadenopathy. Small scattered lymph nodes are seen in the mediastinum. The heart size is normal. Coronary artery calcification is noted. There is no pericardial or pleural effusion.  Linear band of pleural parenchymal opacity in the right apex is unchanged. Posterior right upper lobe 6 mm pulmonary nodule is stable  The spiculated right middle lobe nodule measures 10 x 19 mm today compared to 9 x 20 mm previously.  3 mm left lower  lobe pulmonary nodule measured previously is stable at 3 mm on image 40 today. A second smaller left lower lobe nodule is unchanged.  Since the prior study, the patient has developed interstitial and alveolar opacity involving the posterior right upper lobe and central right lower lobe.  Images which include the upper abdomen show no evidence for an adrenal nodule or mass although neither adrenal gland is been incompletely visualized. A small hypo attenuating lesion in the spleen is unchanged.  Bone windows reveal no worrisome lytic or sclerotic osseous lesions.  IMPRESSION: No substantial change in the spiculated right middle lobe mass.  Interval development of interstitial and alveolar opacity having a central predominance and involving the upper, middle, and lower lobes of the right lung. Although more diffuse than typically seen for a focal treatment port, radiation pneumonitis would be a consideration given the ipsilateral nature of the disease. Alternatively, diffuse infectious/inflammatory alveolitis of the right lung could also have this appearance.   Electronically Signed   By: Misty Stanley M.D.   On: 12/31/2013 12:27   Mr Jeri Cos IR Contrast  01/22/2014   CLINICAL DATA:  Small cell lung cancer. Rule out metastatic disease.  EXAM: MRI HEAD WITHOUT AND WITH CONTRAST  TECHNIQUE: Multiplanar, multiecho pulse sequences of the brain and surrounding structures were obtained without and with intravenous contrast.  CONTRAST:  34mL MULTIHANCE GADOBENATE DIMEGLUMINE 529 MG/ML IV SOLN  COMPARISON:  MRI 08/13/2013  FINDINGS: Mild atrophy most prominent in the temporal lobes, similar to the prior study. Patchy nonenhancing white matter hyperintensities show mild progression from the prior study. This may be due to chronic microvascular ischemia. Cancer treatment may also be responsible for some of these hyperintensities.  Negative for acute infarct.  Negative for hemorrhage mass or edema.  Postcontrast imaging  reveals normal enhancement. No enhancing metastatic deposits identified.  Paranasal sinuses are clear.  IMPRESSION: Negative for metastatic disease.  Mild progression of cerebral white matter changes which may be due to chronic microvascular ischemia/ chemotherapy   Electronically Signed   By: Franchot Gallo M.D.   On: 01/22/2014 13:40    Impression:  Limited stage small cell lung cancer, in remission. I discussed prophylactic cranial irradiation with the patient her husband and daughter. We discussed the pros and cons of this treatment particularly as it relates to her age of 32. According to her husband the patient is already showing problems with memory and has not fully recovered from her radiation and chemotherapy.  After careful consideration the patient and her family have decided against prophylactic cranial radiation which seems very reasonable given the patient's performance status and age.  Plan:  Patient will be followed closely and return in 2-3 months for exam as well  as MRI of the brain. She'll continue close followup in medical oncology.  ____________________________________ Blair Promise, MD

## 2014-01-27 NOTE — Progress Notes (Signed)
Please see the Nurse Progress Note in the MD Initial Consult Encounter for this patient. 

## 2014-02-03 ENCOUNTER — Telehealth: Payer: Self-pay | Admitting: Internal Medicine

## 2014-02-03 NOTE — Telephone Encounter (Signed)
Pt came in to the office to r/s her flush appt from 02/12/2014

## 2014-02-17 ENCOUNTER — Ambulatory Visit (HOSPITAL_BASED_OUTPATIENT_CLINIC_OR_DEPARTMENT_OTHER): Payer: Medicare Other

## 2014-02-17 VITALS — BP 98/60 | HR 70 | Temp 98.1°F

## 2014-02-17 DIAGNOSIS — C342 Malignant neoplasm of middle lobe, bronchus or lung: Secondary | ICD-10-CM | POA: Diagnosis not present

## 2014-02-17 DIAGNOSIS — Z95828 Presence of other vascular implants and grafts: Secondary | ICD-10-CM

## 2014-02-17 DIAGNOSIS — Z452 Encounter for adjustment and management of vascular access device: Secondary | ICD-10-CM | POA: Diagnosis not present

## 2014-02-17 MED ORDER — HEPARIN SOD (PORK) LOCK FLUSH 100 UNIT/ML IV SOLN
500.0000 [IU] | Freq: Once | INTRAVENOUS | Status: AC
  Administered 2014-02-17: 500 [IU] via INTRAVENOUS
  Filled 2014-02-17: qty 5

## 2014-02-17 MED ORDER — SODIUM CHLORIDE 0.9 % IJ SOLN
10.0000 mL | INTRAMUSCULAR | Status: DC | PRN
Start: 2014-02-17 — End: 2014-02-17
  Administered 2014-02-17: 10 mL via INTRAVENOUS
  Filled 2014-02-17: qty 10

## 2014-02-17 NOTE — Patient Instructions (Signed)

## 2014-03-26 ENCOUNTER — Ambulatory Visit (HOSPITAL_BASED_OUTPATIENT_CLINIC_OR_DEPARTMENT_OTHER): Payer: Medicare Other

## 2014-03-26 ENCOUNTER — Ambulatory Visit (HOSPITAL_COMMUNITY)
Admission: RE | Admit: 2014-03-26 | Discharge: 2014-03-26 | Disposition: A | Discharge status: Home / Self Care | Payer: Medicare Other | Source: Ambulatory Visit | Attending: Internal Medicine | Admitting: Internal Medicine

## 2014-03-26 ENCOUNTER — Other Ambulatory Visit (HOSPITAL_BASED_OUTPATIENT_CLINIC_OR_DEPARTMENT_OTHER): Payer: Medicare Other

## 2014-03-26 ENCOUNTER — Encounter (HOSPITAL_COMMUNITY): Payer: Self-pay

## 2014-03-26 VITALS — BP 96/35 | Temp 97.6°F

## 2014-03-26 DIAGNOSIS — J398 Other specified diseases of upper respiratory tract: Secondary | ICD-10-CM | POA: Diagnosis not present

## 2014-03-26 DIAGNOSIS — C342 Malignant neoplasm of middle lobe, bronchus or lung: Secondary | ICD-10-CM

## 2014-03-26 DIAGNOSIS — Z95828 Presence of other vascular implants and grafts: Secondary | ICD-10-CM

## 2014-03-26 DIAGNOSIS — J984 Other disorders of lung: Secondary | ICD-10-CM | POA: Diagnosis not present

## 2014-03-26 DIAGNOSIS — Z9221 Personal history of antineoplastic chemotherapy: Secondary | ICD-10-CM | POA: Insufficient documentation

## 2014-03-26 DIAGNOSIS — C3490 Malignant neoplasm of unspecified part of unspecified bronchus or lung: Secondary | ICD-10-CM | POA: Diagnosis not present

## 2014-03-26 DIAGNOSIS — I700 Atherosclerosis of aorta: Secondary | ICD-10-CM | POA: Diagnosis not present

## 2014-03-26 DIAGNOSIS — J479 Bronchiectasis, uncomplicated: Secondary | ICD-10-CM | POA: Insufficient documentation

## 2014-03-26 DIAGNOSIS — J90 Pleural effusion, not elsewhere classified: Secondary | ICD-10-CM | POA: Diagnosis not present

## 2014-03-26 DIAGNOSIS — J438 Other emphysema: Secondary | ICD-10-CM | POA: Insufficient documentation

## 2014-03-26 DIAGNOSIS — J988 Other specified respiratory disorders: Secondary | ICD-10-CM | POA: Diagnosis not present

## 2014-03-26 DIAGNOSIS — C3491 Malignant neoplasm of unspecified part of right bronchus or lung: Secondary | ICD-10-CM

## 2014-03-26 LAB — COMPREHENSIVE METABOLIC PANEL (CC13)
ALT: 9 U/L (ref 0–55)
AST: 15 U/L (ref 5–34)
Albumin: 3.5 g/dL (ref 3.5–5.0)
Alkaline Phosphatase: 60 U/L (ref 40–150)
Anion Gap: 9 meq/L (ref 3–11)
BUN: 13.6 mg/dL (ref 7.0–26.0)
CO2: 24 meq/L (ref 22–29)
Calcium: 9.3 mg/dL (ref 8.4–10.4)
Chloride: 109 meq/L (ref 98–109)
Creatinine: 0.7 mg/dL (ref 0.6–1.1)
Glucose: 122 mg/dL (ref 70–140)
Potassium: 3.9 meq/L (ref 3.5–5.1)
Sodium: 142 meq/L (ref 136–145)
Total Bilirubin: 0.48 mg/dL (ref 0.20–1.20)
Total Protein: 6.9 g/dL (ref 6.4–8.3)

## 2014-03-26 LAB — CBC WITH DIFFERENTIAL/PLATELET
BASO%: 0.5 % (ref 0.0–2.0)
Basophils Absolute: 0 10*3/uL (ref 0.0–0.1)
EOS%: 2.3 % (ref 0.0–7.0)
Eosinophils Absolute: 0.1 10*3/uL (ref 0.0–0.5)
HCT: 30.7 % — ABNORMAL LOW (ref 34.8–46.6)
HEMOGLOBIN: 10.2 g/dL — AB (ref 11.6–15.9)
LYMPH#: 0.8 10*3/uL — AB (ref 0.9–3.3)
LYMPH%: 16 % (ref 14.0–49.7)
MCH: 32.3 pg (ref 25.1–34.0)
MCHC: 33.1 g/dL (ref 31.5–36.0)
MCV: 97.6 fL (ref 79.5–101.0)
MONO#: 0.5 10*3/uL (ref 0.1–0.9)
MONO%: 9.8 % (ref 0.0–14.0)
NEUT%: 71.4 % (ref 38.4–76.8)
NEUTROS ABS: 3.8 10*3/uL (ref 1.5–6.5)
Platelets: 149 10*3/uL (ref 145–400)
RBC: 3.15 10*6/uL — AB (ref 3.70–5.45)
RDW: 18.8 % — ABNORMAL HIGH (ref 11.2–14.5)
WBC: 5.3 10*3/uL (ref 3.9–10.3)

## 2014-03-26 MED ORDER — SODIUM CHLORIDE 0.9 % IJ SOLN
10.0000 mL | INTRAMUSCULAR | Status: DC | PRN
  Administered 2014-03-26: 10 mL via INTRAVENOUS
  Filled 2014-03-26: qty 10

## 2014-03-26 MED ORDER — IOHEXOL 300 MG/ML  SOLN
80.0000 mL | Freq: Once | INTRAMUSCULAR | Status: AC | PRN
  Administered 2014-03-26: 80 mL via INTRAVENOUS

## 2014-03-26 MED ORDER — HEPARIN SOD (PORK) LOCK FLUSH 100 UNIT/ML IV SOLN
500.0000 [IU] | Freq: Once | INTRAVENOUS | Status: AC
  Administered 2014-03-26: 500 [IU] via INTRAVENOUS
  Filled 2014-03-26: qty 5

## 2014-04-02 ENCOUNTER — Other Ambulatory Visit: Payer: Medicare Other

## 2014-04-02 ENCOUNTER — Telehealth: Payer: Self-pay | Admitting: Internal Medicine

## 2014-04-02 ENCOUNTER — Ambulatory Visit (HOSPITAL_BASED_OUTPATIENT_CLINIC_OR_DEPARTMENT_OTHER): Payer: Medicare Other | Admitting: Internal Medicine

## 2014-04-02 ENCOUNTER — Encounter: Payer: Self-pay | Admitting: Internal Medicine

## 2014-04-02 VITALS — BP 116/75 | HR 83 | Temp 98.5°F | Resp 19 | Ht 67.0 in | Wt 152.6 lb

## 2014-04-02 DIAGNOSIS — C3491 Malignant neoplasm of unspecified part of right bronchus or lung: Secondary | ICD-10-CM

## 2014-04-02 DIAGNOSIS — C342 Malignant neoplasm of middle lobe, bronchus or lung: Secondary | ICD-10-CM | POA: Diagnosis not present

## 2014-04-02 DIAGNOSIS — J90 Pleural effusion, not elsewhere classified: Secondary | ICD-10-CM

## 2014-04-02 NOTE — Telephone Encounter (Signed)
gv adn rpinted appt sched and av sfo rpt for Aug and OCT.Marland KitchenMarland Kitchen

## 2014-04-02 NOTE — Progress Notes (Signed)
[facility]  [phone] [phone]  OFFICE PROGRESS NOTE   Geoffery Lyons, MD [place], [place]. [place] [address]  DIAGNOSIS: Small cell lung carcinoma, limited stage   Primary site: Lung (Right)   Staging method: AJCC 7th Edition   Clinical: Stage IIIA (T2a, N2, M0) signed by Curt Bears, MD on 08/06/2013  3:32 PM   Summary: Stage IIIA (T2a, N2, M0)  PRIOR THERAPY: 1) Systemic chemotherapy with carboplatin for an AUC of 5 given on day 1 and etoposide 120 mg per meter squared on days 1, 2 and 3 with Neulasta support given on day 4. This given concurrent with radiation. Status post 4 cycles 2) she declined prophylactic cranial irradiation.  CURRENT THERAPY: Observation.  DISEASE STAGE: Small cell lung carcinoma, limited stage   Primary site: Lung (Right)   Staging method: AJCC 7th Edition   Clinical: Stage IIIA (T2a, N2, M0) signed by Curt Bears, MD on 08/06/2013  3:32 PM   Summary: Stage IIIA (T2a, N2, M0)  CHEMOTHERAPY INTENT: curative  CURRENT # OF CHEMOTHERAPY CYCLES: 4  CURRENT ANTIEMETICS: Zofran, dexamethasone and Compazine  CURRENT SMOKING STATUS: Former smoker, quit 09/12/1991  ORAL CHEMOTHERAPY AND CONSENT: N/A  CURRENT BISPHOSPHONATES USE: None  PAIN MANAGEMENT: Hydrocodone and acetaminophen, tramadol  NARCOTICS INDUCED CONSTIPATION: Mild  LIVING WILL AND CODE STATUS: ?  INTERVAL HISTORY: Elizabeth Humphrey 80 y.o. female returns for followup visit accompanied by her husband. She has been observation for the last several months with no specific complaints. She denied having any significant chest pain but has shortness of breath with exertion with no cough or hemoptysisThe patient denied having any significant weight loss or night sweats. She denied having any nausea or vomiting, no fever or chills. She had repeat CT scan of the chest performed recently and she is here for  evaluation and discussion of her scan results.  MEDICAL HISTORY: Past Medical History  Diagnosis Date  . Allergy     bee stings/anaphylaxis  . Hyperlipidemia     hx of  . COPD (chronic obstructive pulmonary disease) 07/30/13  . Heart murmur   . History of pneumonia   . Arthritis   . Pyelonephritis   . Cough   . Lung cancer dx'd 07/2013  . History of radiation therapy 08/26/13-10/20/13    59.4 gray to central chest area    ALLERGIES:  has No Known Allergies.  MEDICATIONS:  Current Outpatient Prescriptions  Medication Sig Dispense Refill  . cholecalciferol (VITAMIN D) 1000 UNITS tablet Take 1,000 Units by mouth daily.      [name] escitalopram (LEXAPRO) 10 MG tablet Take 10 mg by mouth daily. Taking 1/2 tablet for the first 8 days then will take a whole tablet.      . lidocaine-prilocaine (EMLA) cream Apply to Port-A-Cath one to 2 hours prior to chemotherapy as directed  30 g  1  . simvastatin (ZOCOR) 40 MG tablet Take 40 mg by mouth at bedtime.      . traMADol (ULTRAM) 50 MG tablet Take 1 tablet by mouth every 6 (six) hours as needed (pain).       [name] acetaminophen (TYLENOL) 325 MG tablet Take 650 mg by mouth every 6 (six) hours as needed (pain).      . mupirocin ointment (BACTROBAN) 2 % Apply 1 application topically as needed. For cuts and scrapes      . polyethylene glycol (MIRALAX / GLYCOLAX) packet Take 17 g by mouth daily.  No current facility-administered medications for this visit.    SURGICAL HISTORY:  Past Surgical History  Procedure Laterality Date  . Dilation and curettage of uterus  yrs ago  . Tonsillectomy    . Cataract extraction      both eyes  . Video bronchoscopy with endobronchial ultrasound N/A 08/04/2013    Procedure: VIDEO BRONCHOSCOPY WITH ENDOBRONCHIAL ULTRASOUND;  Surgeon: Melrose Nakayama, MD;  Location: [facility];  Service: Thoracic;  Laterality: N/A;    REVIEW OF SYSTEMS:  A comprehensive review of systems was negative.   PHYSICAL EXAMINATION:  General appearance: alert, cooperative, appears stated age and no distress Head: Normocephalic, without obvious abnormality, atraumatic Neck: no adenopathy, no carotid bruit, no JVD, supple, symmetrical, trachea midline and thyroid not enlarged, symmetric, no tenderness/mass/nodules Lymph nodes: Cervical, supraclavicular, and axillary nodes normal. Resp: clear to auscultation bilaterally Back: symmetric, no curvature. ROM normal. No CVA tenderness. Cardio: regular rate and rhythm, S1, S2 normal, no murmur, click, rub or gallop GI: soft, non-tender; bowel sounds normal; no masses,  no organomegaly Extremities: extremities normal, atraumatic, no cyanosis or edema Neurologic: Alert and oriented X 3, normal strength and tone. Normal symmetric reflexes. Normal coordination and gait  ECOG PERFORMANCE STATUS: 1 - Symptomatic but completely ambulatory  Blood pressure 116/75, pulse 83, temperature 98.5 F (36.9 C), temperature source Oral, resp. rate 19, height 5\' 7"  (1.702 m), weight 152 lb 9.6 oz (69.219 kg), SpO2 98.00%.  LABORATORY DATA: Lab Results  Component Value Date   WBC 5.3 03/26/2014   HGB 10.2* 03/26/2014   HCT 30.7* 03/26/2014   MCV 97.6 03/26/2014   PLT 149 03/26/2014      Chemistry      Component Value Date/Time   NA 142 03/26/2014 0924   NA 146 10/06/2013 0625   K 3.9 03/26/2014 0924   K 4.0 10/06/2013 0625   CL 108 10/06/2013 0625   CO2 24 03/26/2014 0924   CO2 23 10/06/2013 0625   BUN 13.6 03/26/2014 0924   BUN 42* 10/06/2013 0625   CREATININE 0.7 03/26/2014 0924   CREATININE 0.92 10/06/2013 0625      Component Value Date/Time   CALCIUM 9.3 03/26/2014 0924   CALCIUM 9.0 10/06/2013 0625   ALKPHOS 60 03/26/2014 0924   ALKPHOS 59 10/06/2013 0625   AST 15 03/26/2014 0924   AST 10 10/06/2013 0625   ALT 9 03/26/2014 0924   ALT 10 10/06/2013 0625   BILITOT 0.48 03/26/2014 0924   BILITOT 0.5 10/06/2013 0625       RADIOGRAPHIC STUDIES: Ct Chest W Contrast  03/26/2014   CLINICAL DATA:   Lung cancer diagnosed in 2014. Chemotherapy completed. Restaging.  EXAM: CT CHEST WITH CONTRAST  TECHNIQUE: Multidetector CT imaging of the chest was performed during intravenous contrast administration.  CONTRAST:  10mL OMNIPAQUE IOHEXOL 300 MG/ML  SOLN  COMPARISON:  Chest CTs 12/31/2013 and 11/04/2013.  FINDINGS: Mediastinum: There are no enlarged mediastinal, hilar or axillary lymph nodes. 7 mm high right paraesophageal node on image 12 appears slightly larger than on the prior study Right IJ Port-A-Cath extends to the lower SVC. The thyroid gland appears normal. There is mild tracheal deviation to the right and mild esophageal wall thickening, likely related to radiation therapy. The heart size is normal. There is diffuse atherosclerosis of the aorta, great vessels and coronary arteries.  Lungs/Pleura: Interval development of a moderate size dependent right pleural effusion. This appears simple without associated nodularity. There is no left pleural or pericardial effusion.There are  new paramediastinal pulmonary opacities on the right, primarily within the middle lobe and superior segment of the lower lobe. There is associated volume loss and traction bronchiectasis. The previously demonstrated spiculated right middle lobe nodule is now obscured. Right apical scarring and left lower lobe nodularity are stable. No endobronchial lesion or enlarging nodule is identified. Underlying emphysematous changes are noted within both lungs.  Upper abdomen: There is no adrenal mass. A probable cyst peripherally in the spleen is unchanged.  Musculoskeletal/Chest wall: No evidence of chest wall mass or suspicious osseous lesion.  IMPRESSION: 1. Progressive paramediastinal pulmonary opacities in the right lung with associated volume loss and traction bronchiectasis. 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 No underlying enlarging mass lesion identified. Continued follow up recommended. 2. New moderate size  dependent right pleural effusion without demonstrated malignant features. 3. Otherwise stable pulmonary nodularity and scarring. 4. Interval slight enlargement of a high right paraesophageal node, not pathologically enlarged.   Electronically Signed   By: Camie Patience M.D.   On: 03/26/2014 12:54   ASSESSMENT/PLAN:  This is a very pleasant 80 years old white female was limited stage small cell lung cancer currently undergoing systemic chemotherapy with carboplatin and etoposide status post 4 cycles concurrent with radiation. She declined prophylactic cranial irradiation. Recent CT scan of the chest showed no evidence for disease recurrence but there was progressive paramediastinal pulmonary opacity secondary to radiation fibrosis. There was also a slight interval enlargement of a high) esophageal node and new moderate size right pleural effusion. She is currently asymptomatic. I discussed the scan results with the patient and her family and they recommended for her to continue on observation with repeat CT scan of the chest in 3 months.  She was advised to call immediately if she has any concerning symptoms in the interval. The patient and her husband agreed to the current plan.  Disclaimer: This note was dictated with voice recognition software. Similar sounding words can inadvertently be transcribed and may not be corrected upon review. Eilleen Kempf., MD 04/02/2014

## 2014-04-09 ENCOUNTER — Encounter: Payer: Self-pay | Admitting: Radiation Oncology

## 2014-04-09 ENCOUNTER — Telehealth: Payer: Self-pay | Admitting: *Deleted

## 2014-04-09 ENCOUNTER — Ambulatory Visit
Admission: RE | Admit: 2014-04-09 | Discharge: 2014-04-09 | Disposition: A | Discharge status: Home / Self Care | Payer: Medicare Other | Source: Ambulatory Visit | Attending: Radiation Oncology | Admitting: Radiation Oncology

## 2014-04-09 ENCOUNTER — Ambulatory Visit: Payer: Medicare Other | Admitting: Internal Medicine

## 2014-04-09 VITALS — BP 114/80 | HR 75 | Temp 98.1°F | Ht 67.0 in | Wt 153.4 lb

## 2014-04-09 DIAGNOSIS — C3410 Malignant neoplasm of upper lobe, unspecified bronchus or lung: Secondary | ICD-10-CM | POA: Diagnosis not present

## 2014-04-09 DIAGNOSIS — C3490 Malignant neoplasm of unspecified part of unspecified bronchus or lung: Secondary | ICD-10-CM

## 2014-04-09 NOTE — Progress Notes (Signed)
Elizabeth Humphrey here for follow up after treatment to her right lung.  She denies pain, coughing, hemoptysis, headaches, and shortness of breath.  She reports dizziness when rising from a seated position. She reports forgetfulness.  She had a ct scan of her chest on 03/26/14.

## 2014-04-09 NOTE — Progress Notes (Signed)
Radiation Oncology         [phone] ________________________________  Name: Elizabeth Humphrey MRN: [id number]  Date: 04/09/2014  DOB: 1934/03/29  Follow-Up Visit Note  CC: Geoffery Lyons, MD  Curt Bears, MD  Diagnosis:   Limited stage small cell lung cancer  Interval Since Last Radiation:  5-1/2 months   Narrative:  The patient returns today for routine follow-up.  She is improving with her strength. Patient is doing water aerobics every morning. She underwent a chest CT scan earlier this month which showed no tumor progression. Patient denies any cough or breathing problems. She denies any pain in the chest headaches dizziness her blurred vision.  The husband feels the patient's hearing has decreased.                              ALLERGIES:  has No Known Allergies.  Meds: Current Outpatient Prescriptions  Medication Sig Dispense Refill  . cholecalciferol (VITAMIN D) 1000 UNITS tablet Take 1,000 Units by mouth daily.      [name] escitalopram (LEXAPRO) 10 MG tablet Take 10 mg by mouth daily. Taking 1/2 tablet for the first 8 days then will take a whole tablet.      . simvastatin (ZOCOR) 40 MG tablet Take 40 mg by mouth at bedtime.      . traMADol (ULTRAM) 50 MG tablet Take 1 tablet by mouth every 6 (six) hours as needed (pain).       [name] acetaminophen (TYLENOL) 325 MG tablet Take 650 mg by mouth every 6 (six) hours as needed (pain).      [name] lidocaine-prilocaine (EMLA) cream Apply to Port-A-Cath one to 2 hours prior to chemotherapy as directed  30 g  1  . mupirocin ointment (BACTROBAN) 2 % Apply 1 application topically as needed. For cuts and scrapes      . polyethylene glycol (MIRALAX / GLYCOLAX) packet Take 17 g by mouth daily.       No current facility-administered medications for this encounter.    Physical Findings: The patient is in no acute distress. Patient is alert and oriented.  height is 5\' 7"  (1.702 m) and weight is 153 lb 6.4 oz (69.582 kg). Her oral temperature  is 98.1 F (36.7 C). Her blood pressure is 114/80 and her pulse is 75. Her oxygen saturation is 99%. .  No palpable supraclavicular or axillary adenopathy. The lungs are clear to auscultation. The heart has regular rhythm and rate. The neurological examination is nonfocal.  Lab Findings: Lab Results  Component Value Date   WBC 5.3 03/26/2014   HGB 10.2* 03/26/2014   HCT 30.7* 03/26/2014   MCV 97.6 03/26/2014   PLT 149 03/26/2014      Radiographic Findings: Ct Chest W Contrast  03/26/2014   CLINICAL DATA:  Lung cancer diagnosed in 2014. Chemotherapy completed. Restaging.  EXAM: CT CHEST WITH CONTRAST  TECHNIQUE: Multidetector CT imaging of the chest was performed during intravenous contrast administration.  CONTRAST:  6mL OMNIPAQUE IOHEXOL 300 MG/ML  SOLN  COMPARISON:  Chest CTs 12/31/2013 and 11/04/2013.  FINDINGS: Mediastinum: There are no enlarged mediastinal, hilar or axillary lymph nodes. 7 mm high right paraesophageal node on image 12 appears slightly larger than on the prior study Right IJ Port-A-Cath extends to the lower SVC. The thyroid gland appears normal. There is mild tracheal deviation to the right and mild esophageal wall thickening, likely related to radiation therapy. The heart size is normal.  There is diffuse atherosclerosis of the aorta, great vessels and coronary arteries.  Lungs/Pleura: Interval development of a moderate size dependent right pleural effusion. This appears simple without associated nodularity. There is no left pleural or pericardial effusion.There are new paramediastinal pulmonary opacities on the right, primarily within the middle lobe and superior segment of the lower lobe. There is associated volume loss and traction bronchiectasis. The previously demonstrated spiculated right middle lobe nodule is now obscured. Right apical scarring and left lower lobe nodularity are stable. No endobronchial lesion or enlarging nodule is identified. Underlying emphysematous  changes are noted within both lungs.  Upper abdomen: There is no adrenal mass. A probable cyst peripherally in the spleen is unchanged.  Musculoskeletal/Chest wall: No evidence of chest wall mass or suspicious osseous lesion.  IMPRESSION: 1. Progressive paramediastinal pulmonary opacities in the right lung with associated volume loss and traction bronchiectasis. 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 No underlying enlarging mass lesion identified. Continued follow up recommended. 2. New moderate size dependent right pleural effusion without demonstrated malignant features. 3. Otherwise stable pulmonary nodularity and scarring. 4. Interval slight enlargement of a high right paraesophageal node, not pathologically enlarged.   Electronically Signed   By: Camie Patience M.D.   On: 03/26/2014 12:54    Impression:  Limited stage small cell lung cancer, in remission. The patient declined prophylactic cranial irradiation.  Plan:  Routine followup in 3 months. Patient will be scheduled for a MRI brain in the next several days.  ____________________________________ Blair Promise, MD

## 2014-04-09 NOTE — Telephone Encounter (Signed)
CALLED PATIENT TO INFORM OF TEST AND FU, SPOKE WITH PATIENT AND SHE IS AWARE OF THIS TEST AND FU

## 2014-04-27 ENCOUNTER — Ambulatory Visit (HOSPITAL_COMMUNITY): Payer: Medicare Other

## 2014-05-04 ENCOUNTER — Ambulatory Visit (HOSPITAL_COMMUNITY)
Admission: RE | Admit: 2014-05-04 | Discharge: 2014-05-04 | Disposition: A | Discharge status: Home / Self Care | Payer: Medicare Other | Source: Ambulatory Visit | Attending: Radiation Oncology | Admitting: Radiation Oncology

## 2014-05-04 DIAGNOSIS — C3490 Malignant neoplasm of unspecified part of unspecified bronchus or lung: Secondary | ICD-10-CM | POA: Diagnosis not present

## 2014-05-04 DIAGNOSIS — I6789 Other cerebrovascular disease: Secondary | ICD-10-CM | POA: Insufficient documentation

## 2014-05-04 MED ORDER — GADOBENATE DIMEGLUMINE 529 MG/ML IV SOLN
14.0000 mL | Freq: Once | INTRAVENOUS | Status: AC | PRN
  Administered 2014-05-04: 14 mL via INTRAVENOUS

## 2014-05-05 ENCOUNTER — Telehealth: Payer: Self-pay | Admitting: Oncology

## 2014-05-05 NOTE — Telephone Encounter (Signed)
Elizabeth Humphrey's husband returned Dr. Clabe Seal call about the MRI from yesterday.  Advised Mr. Elizabeth Humphrey that the MRI results were good per Dr. Sondra Come.  Mr. Elizabeth Humphrey verbalized understanding.

## 2014-05-07 ENCOUNTER — Ambulatory Visit (HOSPITAL_BASED_OUTPATIENT_CLINIC_OR_DEPARTMENT_OTHER): Payer: Medicare Other

## 2014-05-07 VITALS — BP 103/60 | HR 76 | Temp 98.2°F

## 2014-05-07 DIAGNOSIS — C342 Malignant neoplasm of middle lobe, bronchus or lung: Secondary | ICD-10-CM | POA: Diagnosis not present

## 2014-05-07 DIAGNOSIS — Z95828 Presence of other vascular implants and grafts: Secondary | ICD-10-CM

## 2014-05-07 DIAGNOSIS — Z452 Encounter for adjustment and management of vascular access device: Secondary | ICD-10-CM

## 2014-05-07 MED ORDER — SODIUM CHLORIDE 0.9 % IJ SOLN
10.0000 mL | INTRAMUSCULAR | Status: DC | PRN
  Administered 2014-05-07: 10 mL via INTRAVENOUS
  Filled 2014-05-07: qty 10

## 2014-05-07 MED ORDER — HEPARIN SOD (PORK) LOCK FLUSH 100 UNIT/ML IV SOLN
500.0000 [IU] | Freq: Once | INTRAVENOUS | Status: AC
  Administered 2014-05-07: 500 [IU] via INTRAVENOUS
  Filled 2014-05-07: qty 5

## 2014-05-07 NOTE — Patient Instructions (Signed)

## 2014-05-12 DIAGNOSIS — R413 Other amnesia: Secondary | ICD-10-CM | POA: Diagnosis not present

## 2014-05-25 ENCOUNTER — Other Ambulatory Visit: Payer: Self-pay | Admitting: Dermatology

## 2014-05-25 DIAGNOSIS — L570 Actinic keratosis: Secondary | ICD-10-CM | POA: Diagnosis not present

## 2014-05-25 DIAGNOSIS — D485 Neoplasm of uncertain behavior of skin: Secondary | ICD-10-CM | POA: Diagnosis not present

## 2014-05-25 DIAGNOSIS — D0460 Carcinoma in situ of skin of unspecified upper limb, including shoulder: Secondary | ICD-10-CM | POA: Diagnosis not present

## 2014-05-25 DIAGNOSIS — D045 Carcinoma in situ of skin of trunk: Secondary | ICD-10-CM | POA: Diagnosis not present

## 2014-05-25 DIAGNOSIS — C44621 Squamous cell carcinoma of skin of unspecified upper limb, including shoulder: Secondary | ICD-10-CM | POA: Diagnosis not present

## 2014-05-25 DIAGNOSIS — C44529 Squamous cell carcinoma of skin of other part of trunk: Secondary | ICD-10-CM | POA: Diagnosis not present

## 2014-05-25 DIAGNOSIS — Z85828 Personal history of other malignant neoplasm of skin: Secondary | ICD-10-CM | POA: Diagnosis not present

## 2014-05-25 DIAGNOSIS — H61009 Unspecified perichondritis of external ear, unspecified ear: Secondary | ICD-10-CM | POA: Diagnosis not present

## 2014-06-03 DIAGNOSIS — C44621 Squamous cell carcinoma of skin of unspecified upper limb, including shoulder: Secondary | ICD-10-CM | POA: Diagnosis not present

## 2014-06-03 DIAGNOSIS — D045 Carcinoma in situ of skin of trunk: Secondary | ICD-10-CM | POA: Diagnosis not present

## 2014-06-03 DIAGNOSIS — Z85828 Personal history of other malignant neoplasm of skin: Secondary | ICD-10-CM | POA: Diagnosis not present

## 2014-06-18 ENCOUNTER — Other Ambulatory Visit (HOSPITAL_BASED_OUTPATIENT_CLINIC_OR_DEPARTMENT_OTHER): Payer: Medicare Other

## 2014-06-18 ENCOUNTER — Ambulatory Visit (HOSPITAL_COMMUNITY)
Admission: RE | Admit: 2014-06-18 | Discharge: 2014-06-18 | Disposition: A | Discharge status: Home / Self Care | Payer: Medicare Other | Source: Ambulatory Visit | Attending: Internal Medicine | Admitting: Internal Medicine

## 2014-06-18 ENCOUNTER — Encounter (HOSPITAL_COMMUNITY): Payer: Self-pay

## 2014-06-18 ENCOUNTER — Ambulatory Visit (HOSPITAL_BASED_OUTPATIENT_CLINIC_OR_DEPARTMENT_OTHER): Payer: Medicare Other

## 2014-06-18 DIAGNOSIS — Z923 Personal history of irradiation: Secondary | ICD-10-CM | POA: Diagnosis not present

## 2014-06-18 DIAGNOSIS — C3400 Malignant neoplasm of unspecified main bronchus: Secondary | ICD-10-CM

## 2014-06-18 DIAGNOSIS — Z452 Encounter for adjustment and management of vascular access device: Secondary | ICD-10-CM

## 2014-06-18 DIAGNOSIS — C3490 Malignant neoplasm of unspecified part of unspecified bronchus or lung: Secondary | ICD-10-CM | POA: Diagnosis not present

## 2014-06-18 DIAGNOSIS — C3491 Malignant neoplasm of unspecified part of right bronchus or lung: Secondary | ICD-10-CM

## 2014-06-18 DIAGNOSIS — R918 Other nonspecific abnormal finding of lung field: Secondary | ICD-10-CM | POA: Diagnosis not present

## 2014-06-18 DIAGNOSIS — Z9221 Personal history of antineoplastic chemotherapy: Secondary | ICD-10-CM | POA: Diagnosis not present

## 2014-06-18 DIAGNOSIS — Z95828 Presence of other vascular implants and grafts: Secondary | ICD-10-CM

## 2014-06-18 LAB — COMPREHENSIVE METABOLIC PANEL (CC13)
ALK PHOS: 63 U/L (ref 40–150)
ALT: 10 U/L (ref 0–55)
AST: 14 U/L (ref 5–34)
Albumin: 3.3 g/dL — ABNORMAL LOW (ref 3.5–5.0)
Anion Gap: 7 mEq/L (ref 3–11)
BILIRUBIN TOTAL: 0.39 mg/dL (ref 0.20–1.20)
BUN: 13.5 mg/dL (ref 7.0–26.0)
CO2: 28 mEq/L (ref 22–29)
Calcium: 9.4 mg/dL (ref 8.4–10.4)
Chloride: 108 mEq/L (ref 98–109)
Creatinine: 0.7 mg/dL (ref 0.6–1.1)
Glucose: 105 mg/dl (ref 70–140)
Potassium: 3.9 mEq/L (ref 3.5–5.1)
SODIUM: 143 meq/L (ref 136–145)
TOTAL PROTEIN: 6.9 g/dL (ref 6.4–8.3)

## 2014-06-18 LAB — CBC WITH DIFFERENTIAL/PLATELET
BASO%: 0.6 % (ref 0.0–2.0)
Basophils Absolute: 0 10*3/uL (ref 0.0–0.1)
EOS ABS: 0.1 10*3/uL (ref 0.0–0.5)
EOS%: 2.7 % (ref 0.0–7.0)
HEMATOCRIT: 29.8 % — AB (ref 34.8–46.6)
HGB: 9.9 g/dL — ABNORMAL LOW (ref 11.6–15.9)
LYMPH%: 14.7 % (ref 14.0–49.7)
MCH: 31.8 pg (ref 25.1–34.0)
MCHC: 33.1 g/dL (ref 31.5–36.0)
MCV: 96 fL (ref 79.5–101.0)
MONO#: 0.5 10*3/uL (ref 0.1–0.9)
MONO%: 8.8 % (ref 0.0–14.0)
NEUT%: 73.2 % (ref 38.4–76.8)
NEUTROS ABS: 3.8 10*3/uL (ref 1.5–6.5)
PLATELETS: 172 10*3/uL (ref 145–400)
RBC: 3.11 10*6/uL — ABNORMAL LOW (ref 3.70–5.45)
RDW: 16.1 % — ABNORMAL HIGH (ref 11.2–14.5)
WBC: 5.2 10*3/uL (ref 3.9–10.3)
lymph#: 0.8 10*3/uL — ABNORMAL LOW (ref 0.9–3.3)

## 2014-06-18 MED ORDER — IOHEXOL 300 MG/ML  SOLN
80.0000 mL | Freq: Once | INTRAMUSCULAR | Status: AC | PRN
  Administered 2014-06-18: 80 mL via INTRAVENOUS

## 2014-06-18 MED ORDER — SODIUM CHLORIDE 0.9 % IJ SOLN
10.0000 mL | INTRAMUSCULAR | Status: DC | PRN
  Administered 2014-06-18: 10 mL via INTRAVENOUS
  Filled 2014-06-18: qty 10

## 2014-06-18 MED ORDER — HEPARIN SOD (PORK) LOCK FLUSH 100 UNIT/ML IV SOLN
500.0000 [IU] | Freq: Once | INTRAVENOUS | Status: AC
  Administered 2014-06-18: 500 [IU] via INTRAVENOUS
  Filled 2014-06-18: qty 5

## 2014-06-18 NOTE — Patient Instructions (Signed)

## 2014-06-25 ENCOUNTER — Ambulatory Visit (HOSPITAL_BASED_OUTPATIENT_CLINIC_OR_DEPARTMENT_OTHER): Payer: Medicare Other | Admitting: Internal Medicine

## 2014-06-25 ENCOUNTER — Encounter: Payer: Self-pay | Admitting: Internal Medicine

## 2014-06-25 ENCOUNTER — Telehealth: Payer: Self-pay | Admitting: Internal Medicine

## 2014-06-25 VITALS — BP 100/44 | HR 87 | Temp 97.9°F | Resp 18 | Ht 67.0 in | Wt 154.6 lb

## 2014-06-25 DIAGNOSIS — D649 Anemia, unspecified: Secondary | ICD-10-CM

## 2014-06-25 DIAGNOSIS — C3491 Malignant neoplasm of unspecified part of right bronchus or lung: Secondary | ICD-10-CM

## 2014-06-25 NOTE — Telephone Encounter (Signed)
Gave AVS w/ apt d/t. Ct will call and set up scan.-Amber

## 2014-06-25 NOTE — Progress Notes (Signed)
[facility]  Telephone:(336) [phone] Fax:(336) [phone]  OFFICE PROGRESS NOTE   ARONSON,RICHARD A, MD [place] [address]  DIAGNOSIS: Small cell lung carcinoma, limited stage   Primary site: Lung (Right)   Staging method: AJCC 7th Edition   Clinical: Stage IIIA (T2a, N2, M0) signed by Curt Bears, MD on 08/06/2013  3:32 PM   Summary: Stage IIIA (T2a, N2, M0)  PRIOR THERAPY: 1) Systemic chemotherapy with carboplatin for an AUC of 5 given on day 1 and etoposide 120 mg per meter squared on days 1, 2 and 3 with Neulasta support given on day 4. This given concurrent with radiation. Status post 4 cycles 2) she declined prophylactic cranial irradiation.  CURRENT THERAPY: Observation.  DISEASE STAGE: Small cell lung carcinoma, limited stage   Primary site: Lung (Right)   Staging method: AJCC 7th Edition   Clinical: Stage IIIA (T2a, N2, M0) signed by Curt Bears, MD on 08/06/2013  3:32 PM   Summary: Stage IIIA (T2a, N2, M0)  CHEMOTHERAPY INTENT: curative  CURRENT # OF CHEMOTHERAPY CYCLES: 4  CURRENT ANTIEMETICS: Zofran, dexamethasone and Compazine  CURRENT SMOKING STATUS: Former smoker, quit 09/12/1991  ORAL CHEMOTHERAPY AND CONSENT: N/A  CURRENT BISPHOSPHONATES USE: None  PAIN MANAGEMENT: Hydrocodone and acetaminophen, tramadol  NARCOTICS INDUCED CONSTIPATION: Mild  LIVING WILL AND CODE STATUS: ?  INTERVAL HISTORY: Elizabeth Humphrey 80 y.o. female returns for followup visit accompanied by her husband. She has been observation with no specific complaints. She has occasional dizzy spells. She denied having any significant chest pain, breath with exertion, cough or hemoptysis. The patient denied having any significant weight loss or night sweats. She denied having any nausea or vomiting, no fever or chills. She had repeat CT scan of the chest performed recently and she is here for evaluation and discussion of her scan results.  MEDICAL  HISTORY: Past Medical History  Diagnosis Date  . Allergy     bee stings/anaphylaxis  . Hyperlipidemia     hx of  . COPD (chronic obstructive pulmonary disease) 07/30/13  . Heart murmur   . History of pneumonia   . Arthritis   . Pyelonephritis   . Cough   . Lung cancer dx'd 07/2013  . History of radiation therapy 08/26/13-10/20/13    59.4 gray to central chest area    ALLERGIES:  has No Known Allergies.  MEDICATIONS:  Current Outpatient Prescriptions  Medication Sig Dispense Refill  . escitalopram (LEXAPRO) 10 MG tablet Take 10 mg by mouth daily. Taking 1/2 tablet for the first 8 days then will take a whole tablet.      . mupirocin ointment (BACTROBAN) 2 % Apply 1 application topically as needed. For cuts and scrapes      . polyethylene glycol (MIRALAX / GLYCOLAX) packet Take 17 g by mouth daily.      . rivastigmine (EXELON) 9.5 mg/24hr Place 9.5 mg onto the skin daily.      . simvastatin (ZOCOR) 40 MG tablet Take 40 mg by mouth at bedtime.      [name] acetaminophen (TYLENOL) 325 MG tablet Take 650 mg by mouth every 6 (six) hours as needed (pain).      . cholecalciferol (VITAMIN D) 1000 UNITS tablet Take 1,000 Units by mouth daily.      [name] lidocaine-prilocaine (EMLA) cream Apply to Port-A-Cath one to 2 hours prior to chemotherapy as directed  30 g  1  . traMADol (ULTRAM) 50 MG tablet Take 1 tablet by mouth every  6 (six) hours as needed (pain).        No current facility-administered medications for this visit.    SURGICAL HISTORY:  Past Surgical History  Procedure Laterality Date  . Dilation and curettage of uterus  yrs ago  . Tonsillectomy    . Cataract extraction      both eyes  . Video bronchoscopy with endobronchial ultrasound N/A 08/04/2013    Procedure: VIDEO BRONCHOSCOPY WITH ENDOBRONCHIAL ULTRASOUND;  Surgeon: Melrose Nakayama, MD;  Location: [facility];  Service: Thoracic;  Laterality: N/A;    REVIEW OF SYSTEMS:  A comprehensive review of systems was negative.    PHYSICAL EXAMINATION: General appearance: alert, cooperative, appears stated age and no distress Head: Normocephalic, without obvious abnormality, atraumatic Neck: no adenopathy, no carotid bruit, no JVD, supple, symmetrical, trachea midline and thyroid not enlarged, symmetric, no tenderness/mass/nodules Lymph nodes: Cervical, supraclavicular, and axillary nodes normal. Resp: clear to auscultation bilaterally Back: symmetric, no curvature. ROM normal. No CVA tenderness. Cardio: regular rate and rhythm, S1, S2 normal, no murmur, click, rub or gallop GI: soft, non-tender; bowel sounds normal; no masses,  no organomegaly Extremities: extremities normal, atraumatic, no cyanosis or edema Neurologic: Alert and oriented X 3, normal strength and tone. Normal symmetric reflexes. Normal coordination and gait  ECOG PERFORMANCE STATUS: 1 - Symptomatic but completely ambulatory  Blood pressure 100/44, pulse 87, temperature 97.9 F (36.6 C), temperature source Oral, resp. rate 18, height 5\' 7"  (1.702 m), weight 154 lb 9.6 oz (70.126 kg).  LABORATORY DATA: Lab Results  Component Value Date   WBC 5.2 06/18/2014   HGB 9.9* 06/18/2014   HCT 29.8* 06/18/2014   MCV 96.0 06/18/2014   PLT 172 06/18/2014      Chemistry      Component Value Date/Time   NA 143 06/18/2014 0903   NA 146 10/06/2013 0625   K 3.9 06/18/2014 0903   K 4.0 10/06/2013 0625   CL 108 10/06/2013 0625   CO2 28 06/18/2014 0903   CO2 23 10/06/2013 0625   BUN 13.5 06/18/2014 0903   BUN 42* 10/06/2013 0625   CREATININE 0.7 06/18/2014 0903   CREATININE 0.92 10/06/2013 0625      Component Value Date/Time   CALCIUM 9.4 06/18/2014 0903   CALCIUM 9.0 10/06/2013 0625   ALKPHOS 63 06/18/2014 0903   ALKPHOS 59 10/06/2013 0625   AST 14 06/18/2014 0903   AST 10 10/06/2013 0625   ALT 10 06/18/2014 0903   ALT 10 10/06/2013 0625   BILITOT 0.39 06/18/2014 0903   BILITOT 0.5 10/06/2013 0625       RADIOGRAPHIC STUDIES: Ct Chest W Contrast  06/18/2014    CLINICAL DATA:  Subsequent encounter for lung cancer. Diagnosis and November 2014. Chemotherapy and radiation therapy are complete. No active symptoms.  EXAM: CT CHEST WITH CONTRAST  TECHNIQUE: Multidetector CT imaging of the chest was performed during intravenous contrast administration.  CONTRAST:  63mL OMNIPAQUE IOHEXOL 300 MG/ML  SOLN  COMPARISON:  03/26/2014  FINDINGS: Chest wall:  No breast masses, supraclavicular or axillary lymphadenopathy. Small scattered lymph nodes are stable the right-sided Port-A-Cath is stable. The bony thorax is intact. No destructive bone lesions or spinal canal compromise. Stable degenerative changes involving the thoracic spine.  Mediastinum:  The heart is normal in size. No pericardial effusion. Stable dense 3 vessel coronary artery calcifications and aortic and branch vessel calcifications. The right paraesophageal lymph node on image number 10 is stable measuring a maximum of 8 mm. No enlarged mediastinal  or hilar lymph nodes. The esophagus is grossly normal.  Lungs: Stable radiation changes in the right upper lobe and in the right paramediastinal along. No definite CT findings for recurrent or progressive tumor. There is a deep persistent but slightly smaller right pleural effusion. Small bilateral pulmonary nodules are unchanged. Recommend continued surveillance.  Upper abdomen: Unremarkable. No upper abdominal metastatic disease. Advanced atherosclerotic calcifications involving the aorta and branch vessels. Stable splenic cyst.  IMPRESSION: 1. Stable radiation changes involving the right upper lobe and right paramediastinal lung. No obvious recurrent are progressive tumor. 2. Stable small mediastinal lymph nodes. 3. Stable small bilateral pulmonary nodules. 4. Slight interval decrease in size of the right pleural effusion.   Electronically Signed   By: Kalman Jewels M.D.   On: 06/18/2014 11:47   ASSESSMENT/PLAN:  This is a very pleasant 80 years old white female was  limited stage small cell lung cancer currently undergoing systemic chemotherapy with carboplatin and etoposide status post 4 cycles concurrent with radiation. She declined prophylactic cranial irradiation. Her recent CT scan of the chest showed no evidence for disease progression. I discussed the scan results with the patient and her family and they recommended for her to continue on observation with repeat CT scan of the chest in 3 months. She will continue to have Port-A-Cath flush every 6 weeks. For the persistent anemia, I advised the patient to take over-the-counter iron supplements 1-2 tablets every day. She was advised to call immediately if she has any concerning symptoms in the interval. The patient and her husband agreed to the current plan.  Disclaimer: This note was dictated with voice recognition software. Similar sounding words can inadvertently be transcribed and may not be corrected upon review. Eilleen Kempf., MD 06/25/2014

## 2014-06-29 DIAGNOSIS — R7301 Impaired fasting glucose: Secondary | ICD-10-CM | POA: Diagnosis not present

## 2014-06-29 DIAGNOSIS — Z008 Encounter for other general examination: Secondary | ICD-10-CM | POA: Diagnosis not present

## 2014-06-29 DIAGNOSIS — R8299 Other abnormal findings in urine: Secondary | ICD-10-CM | POA: Diagnosis not present

## 2014-06-29 DIAGNOSIS — E785 Hyperlipidemia, unspecified: Secondary | ICD-10-CM | POA: Diagnosis not present

## 2014-07-03 ENCOUNTER — Telehealth: Payer: Self-pay | Admitting: Internal Medicine

## 2014-07-03 NOTE — Telephone Encounter (Signed)
pt should f/u in jan 58mos not april 35mos. s/w pt she is aware and was given new appts for lab/ct 09/24/14 and f/u 10/01/14. other appts remain the same. s/w pt she is aware and new schedule mailed.

## 2014-07-06 DIAGNOSIS — R7301 Impaired fasting glucose: Secondary | ICD-10-CM | POA: Diagnosis not present

## 2014-07-06 DIAGNOSIS — Z0000 Encounter for general adult medical examination without abnormal findings: Secondary | ICD-10-CM | POA: Diagnosis not present

## 2014-07-06 DIAGNOSIS — M1990 Unspecified osteoarthritis, unspecified site: Secondary | ICD-10-CM | POA: Diagnosis not present

## 2014-07-06 DIAGNOSIS — M4800 Spinal stenosis, site unspecified: Secondary | ICD-10-CM | POA: Diagnosis not present

## 2014-07-06 DIAGNOSIS — Z6824 Body mass index (BMI) 24.0-24.9, adult: Secondary | ICD-10-CM | POA: Diagnosis not present

## 2014-07-06 DIAGNOSIS — E785 Hyperlipidemia, unspecified: Secondary | ICD-10-CM | POA: Diagnosis not present

## 2014-07-06 DIAGNOSIS — Z23 Encounter for immunization: Secondary | ICD-10-CM | POA: Diagnosis not present

## 2014-07-07 DIAGNOSIS — Z1212 Encounter for screening for malignant neoplasm of rectum: Secondary | ICD-10-CM | POA: Diagnosis not present

## 2014-07-20 IMAGING — CT CT CHEST W/ CM
2 of 3 series · 15 of 36 positions shown, 18 images · IV contrast (omnipaque)
Comparison: DG ABD ACUTE W/CHEST dated 10/04/2013;

CLINICAL DATA: Restaging lung cancer. Chemotherapy in progress.
Radiation therapy complete.

EXAM:
CT CHEST WITH CONTRAST
TECHNIQUE: Multidetector CT imaging of the chest was performed during
intravenous contrast administration.
CONTRAST:  80mL OMNIPAQUE IOHEXOL 300 MG/ML  SOLN

[Series 2: chest with st · axial · 0.70mm/px · z∈[+992,+1237]mm · 12 of 59 slices shown, 15 images]
[im 5/59  mediastinal]
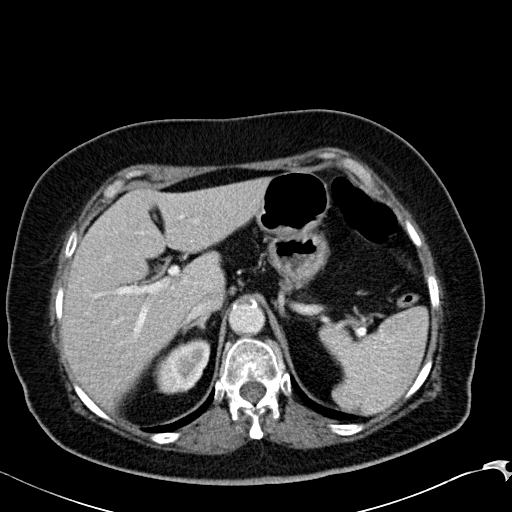
[im 5/59  lung]
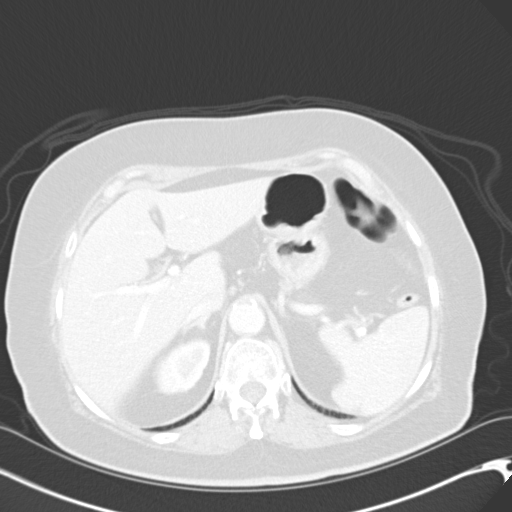
[im 9/59  lung]
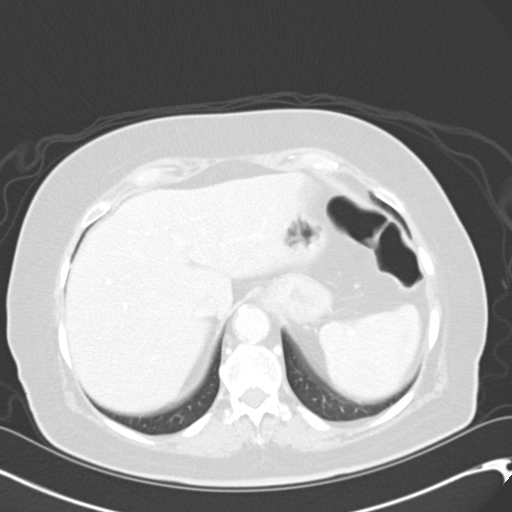
[im 13/59  lung]
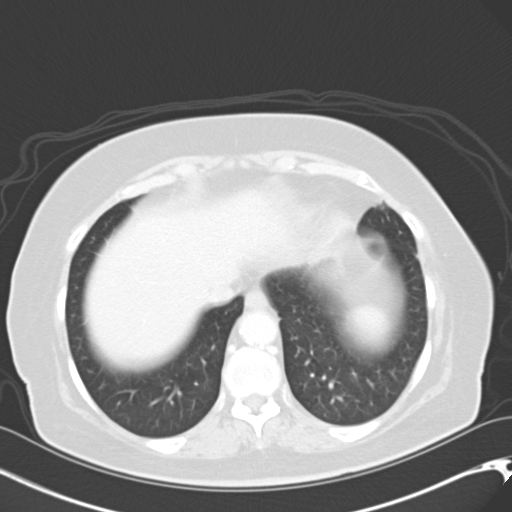
[im 18/59  lung]
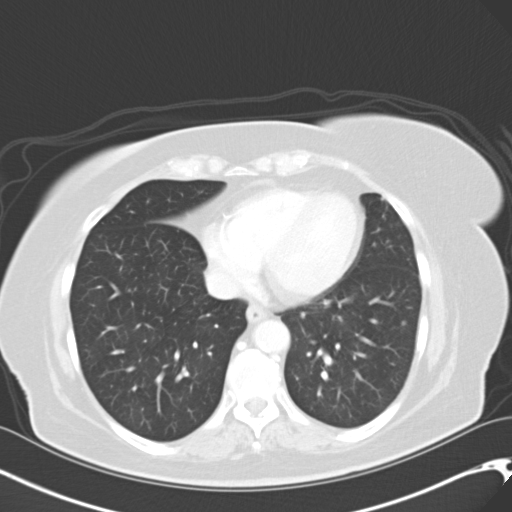
[im 22/59  mediastinal]
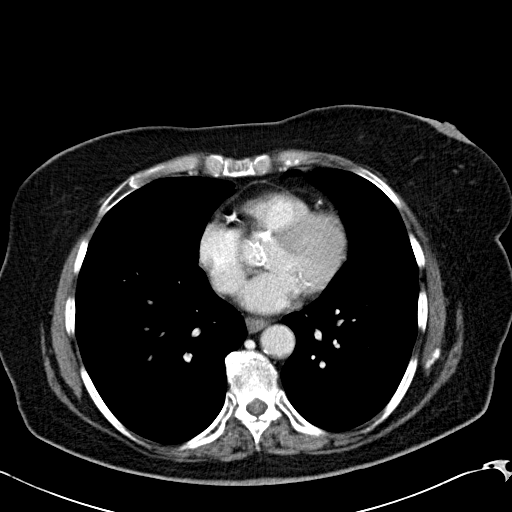
[im 22/59  lung]
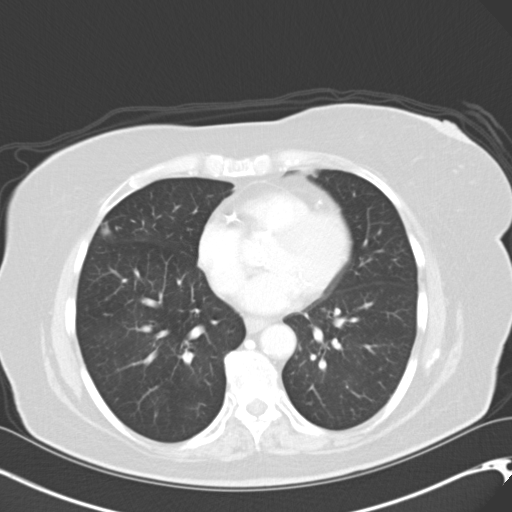
[im 26/59  lung]
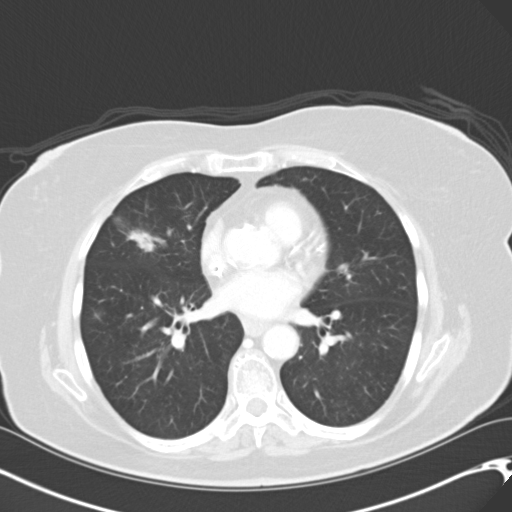
[im 33/59  lung]
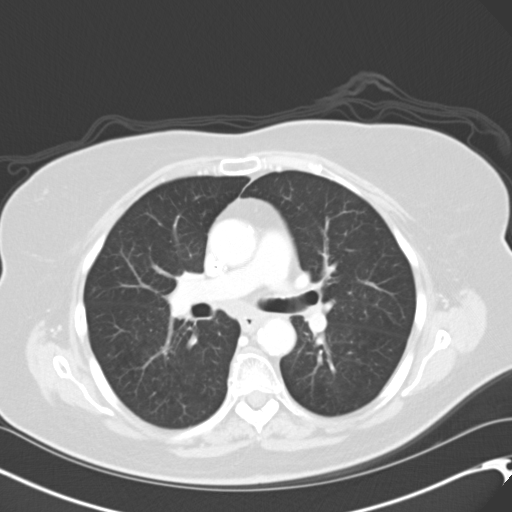
[im 37/59  lung]
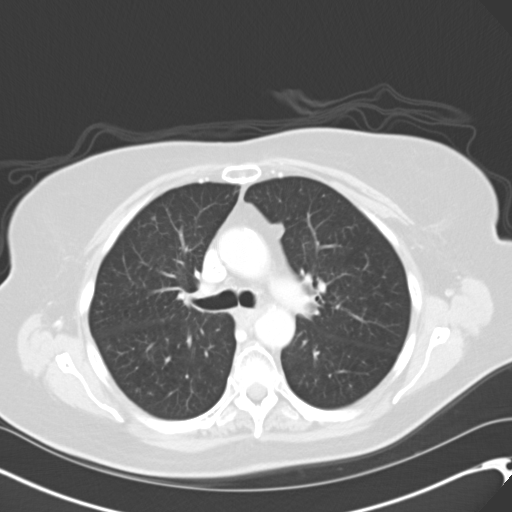
[im 41/59  mediastinal]
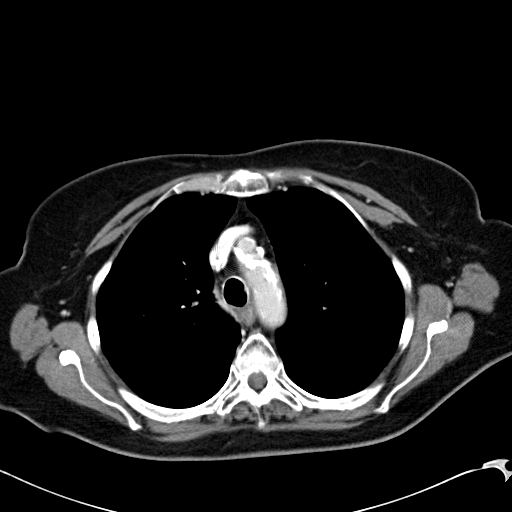
[im 41/59  lung]
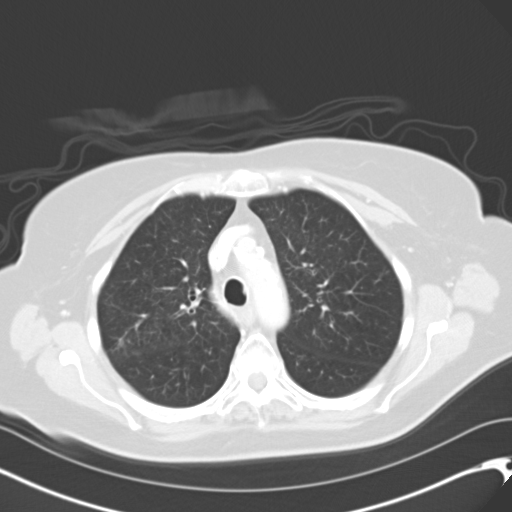
[im 46/59  lung]
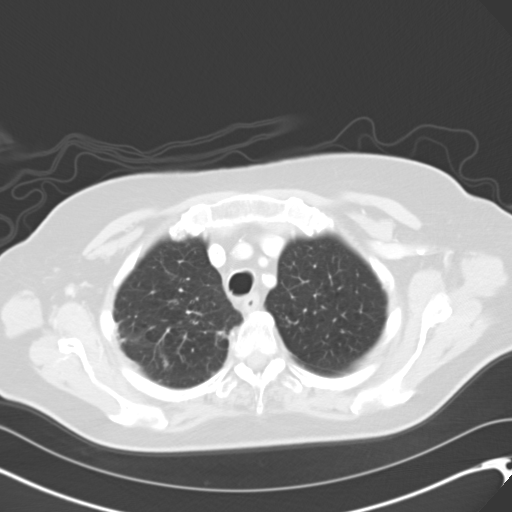
[im 50/59  lung]
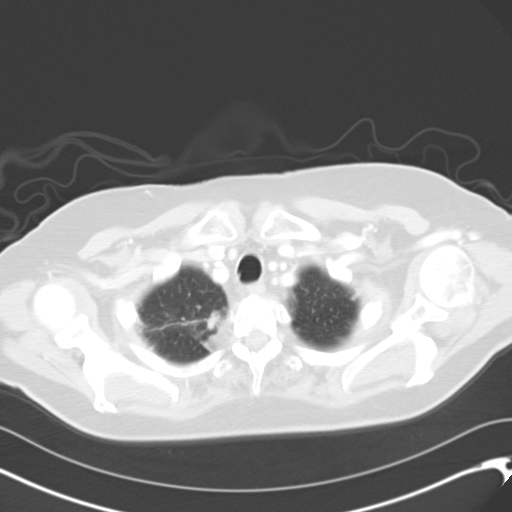
[im 54/59  lung]
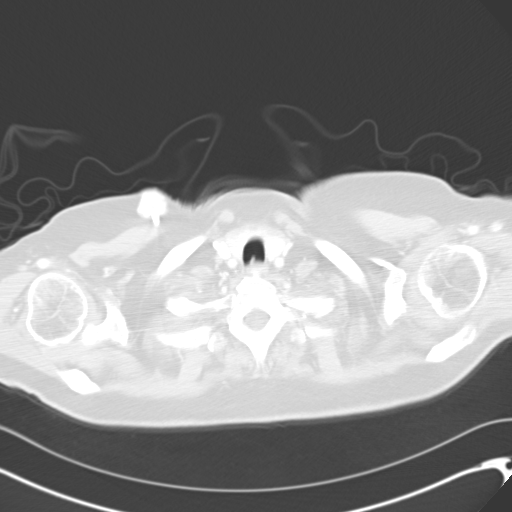

[Series 602: <mpr thick range> · coronal · 0.70mm/px · 3 of 83 slices shown]
[im 17/83  lung]
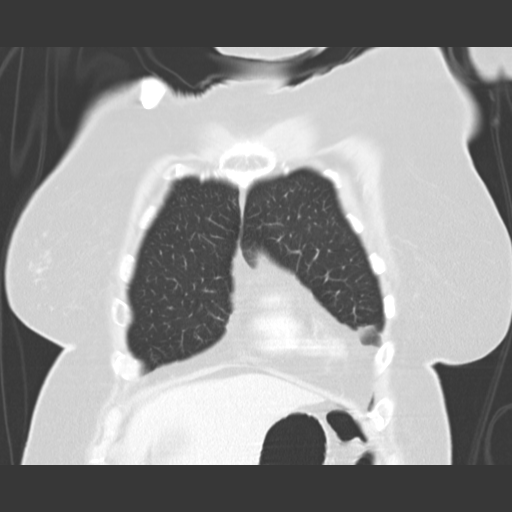
[im 33/83  lung]
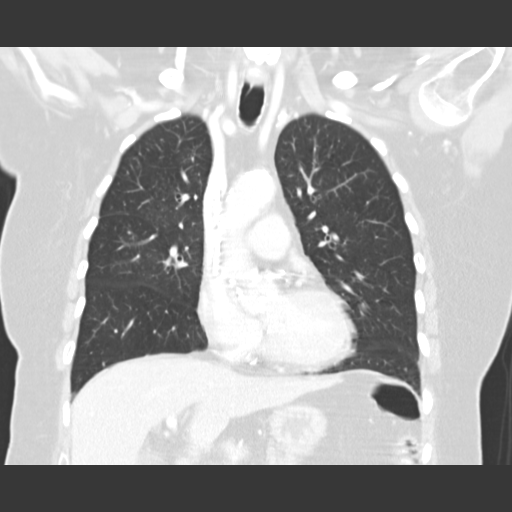
[im 50/83  lung]
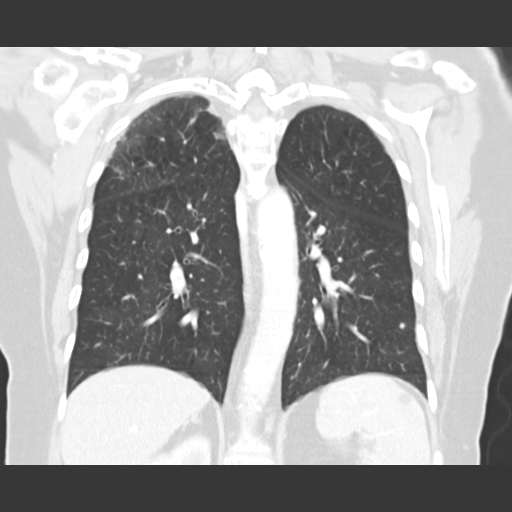

[15 of 36 positions shown; findings below may reference images not displayed]

CT CHEST W/CM
dated 09/19/2013; NM PET IMAGE INITIAL (PI) SKULL BASE TO THIGH dated
07/24/2013
FINDINGS: There is a port in the right chest wall. No axillary or
supraclavicular lymphadenopathy. Subcarinal lymph node measuring 13
mm compared to 14 mm on prior. No hilar adenopathy evident. No
pericardial fluid. Esophagus is normal.

There is a band of parenchymal thickening in the right upper lobe
(image 8, series 5) not changed from prior. Right upper lobe nodule
measures 6 mm (image 13) not changed from prior. Within the right
middle lobe 20 mm x 9 mm nodule compares to 18 mm x 14 mm nodule on
prior for no significant change. There is a new right lower lobe
nodule measuring 6 mm (image 33). Within the left lower lobe 3 mm
nodule is unchanged.

Limited view of the upper abdomen demonstrates normal adrenal
glands. No liver lesion on limited view the liver. Limited view of
the skeleton demonstrates no aggressive osseous lesion.
IMPRESSION: 1. Dominant nodule in the right middle lobe not significant changed
from prior.
2. New nodule in the right lower lobe measuring 6 mm. Recommend
close attention on follow-up.
3. Stable nodular pleural parenchymal thickening at the right lung
apex.
4. Subcarinal lymph node is similar.  No new mediastinal adenopathy.

## 2014-07-23 ENCOUNTER — Other Ambulatory Visit: Payer: Self-pay

## 2014-07-23 DIAGNOSIS — Z1231 Encounter for screening mammogram for malignant neoplasm of breast: Secondary | ICD-10-CM

## 2014-07-30 ENCOUNTER — Encounter: Payer: Self-pay | Admitting: Radiation Oncology

## 2014-07-30 ENCOUNTER — Ambulatory Visit
Admission: RE | Admit: 2014-07-30 | Discharge: 2014-07-30 | Disposition: A | Discharge status: Home / Self Care | Payer: Medicare Other | Source: Ambulatory Visit | Attending: Radiation Oncology | Admitting: Radiation Oncology

## 2014-07-30 ENCOUNTER — Ambulatory Visit (HOSPITAL_BASED_OUTPATIENT_CLINIC_OR_DEPARTMENT_OTHER): Payer: Medicare Other

## 2014-07-30 ENCOUNTER — Telehealth: Payer: Self-pay | Admitting: Radiation Oncology

## 2014-07-30 VITALS — BP 101/50 | HR 80 | Temp 98.1°F

## 2014-07-30 DIAGNOSIS — C3491 Malignant neoplasm of unspecified part of right bronchus or lung: Secondary | ICD-10-CM

## 2014-07-30 DIAGNOSIS — Z95828 Presence of other vascular implants and grafts: Secondary | ICD-10-CM

## 2014-07-30 DIAGNOSIS — C3490 Malignant neoplasm of unspecified part of unspecified bronchus or lung: Secondary | ICD-10-CM | POA: Diagnosis not present

## 2014-07-30 DIAGNOSIS — Z510 Encounter for antineoplastic radiation therapy: Secondary | ICD-10-CM | POA: Diagnosis not present

## 2014-07-30 DIAGNOSIS — Z452 Encounter for adjustment and management of vascular access device: Secondary | ICD-10-CM

## 2014-07-30 LAB — BUN AND CREATININE (CC13)
BUN: 17.8 mg/dL (ref 7.0–26.0)
Creatinine: 0.8 mg/dL (ref 0.6–1.1)

## 2014-07-30 MED ORDER — HEPARIN SOD (PORK) LOCK FLUSH 100 UNIT/ML IV SOLN
500.0000 [IU] | Freq: Once | INTRAVENOUS | Status: AC
  Administered 2014-07-30: 500 [IU] via INTRAVENOUS
  Filled 2014-07-30: qty 5

## 2014-07-30 MED ORDER — SODIUM CHLORIDE 0.9 % IJ SOLN
10.0000 mL | INTRAMUSCULAR | Status: DC | PRN
  Administered 2014-07-30: 10 mL via INTRAVENOUS
  Filled 2014-07-30: qty 10

## 2014-07-30 NOTE — Progress Notes (Signed)
Elizabeth Humphrey here for follow up after treatment for lung cancer.  She denies pain and shortness of breath.  She reports having a dry cough.  She denies a sore throat and trouble swallowing.  She reports a good appetite.  She reports feeling dizzy occasionally especially with standing from a sitting position.  She reports her fatigue is improving and she is taking long walks.

## 2014-07-30 NOTE — Telephone Encounter (Signed)
LVM for Elizabeth Humphrey to call back. Wanted to let her know about MRI.

## 2014-07-30 NOTE — Progress Notes (Signed)
  Radiation Oncology         [phone] ________________________________  Name: Elizabeth Humphrey MRN: [id number]  Date: 07/30/2014  DOB: 01-05-1934  Follow-Up Visit Note  CC: Geoffery Lyons, MD  Curt Bears, MD    ICD-9-CM ICD-10-CM   1. Small cell lung carcinoma, unspecified laterality 162.9 C34.90 BUN     Creatinine, serum     MR Brain W Wo Contrast    Diagnosis:   Limited stage small cell lung cancer  Interval Since Last Radiation:  9  months  Narrative:  The patient returns today for routine follow-up.  She is doing well and without complaints. She underwent a chest CT scan earlier this fall with no evidence of progressive disease. This denies any headaches or visual problems. She denies any pain within the chest or hemoptysis or significant cough. She denies any new bony pain                              ALLERGIES:  has No Known Allergies.  Meds: Current Outpatient Prescriptions  Medication Sig Dispense Refill  . cholecalciferol (VITAMIN D) 1000 UNITS tablet Take 1,000 Units by mouth daily.    [name] lidocaine-prilocaine (EMLA) cream Apply to Port-A-Cath one to 2 hours prior to chemotherapy as directed 30 g 1  . rivastigmine (EXELON) 9.5 mg/24hr Place 9.5 mg onto the skin daily.    . simvastatin (ZOCOR) 40 MG tablet Take 40 mg by mouth at bedtime.    . traMADol (ULTRAM) 50 MG tablet Take 1 tablet by mouth every 6 (six) hours as needed (pain).     [name] acetaminophen (TYLENOL) 325 MG tablet Take 650 mg by mouth every 6 (six) hours as needed (pain).    [name] escitalopram (LEXAPRO) 10 MG tablet Take 10 mg by mouth daily. Taking 1/2 tablet for the first 8 days then will take a whole tablet.    . mupirocin ointment (BACTROBAN) 2 % Apply 1 application topically as needed. For cuts and scrapes    . polyethylene glycol (MIRALAX / GLYCOLAX) packet Take 17 g by mouth daily.     No current facility-administered medications for this encounter.    Physical Findings: The patient is in  no acute distress. Patient is alert and oriented.  height is 5\' 7"  (1.702 m) and weight is 161 lb 3.2 oz (73.12 kg). Her oral temperature is 98.9 F (37.2 C). Her blood pressure is 113/47 and her pulse is 79. Her respiration is 16 and oxygen saturation is 100%. .  No palpable supraclavicular or axillary adenopathy. The lungs are clear to auscultation. The heart has a regular rhythm and rate. On neurological examination motor strength is 5 out of 5 in the proximal and distal muscle groups of the upper and lower extremities.  Lab Findings: Lab Results  Component Value Date   WBC 5.2 06/18/2014   HGB 9.9* 06/18/2014   HCT 29.8* 06/18/2014   MCV 96.0 06/18/2014   PLT 172 06/18/2014    Radiographic Findings: No results found.  Impression:  No evidence of recurrence on clinical exam today  Plan:  Routine follow-up in 3 months. Patient declined prophylactic cranial radiation and will proceed with an MRI of the brain after Thanksgiving holiday.  ____________________________________ Blair Promise, MD

## 2014-07-30 NOTE — Patient Instructions (Signed)

## 2014-07-31 ENCOUNTER — Telehealth: Payer: Self-pay | Admitting: Radiation Oncology

## 2014-07-31 NOTE — Telephone Encounter (Signed)
Spoke with Elizabeth Humphrey regarding her 12/4 MRI appt. She was agreeable.

## 2014-08-14 ENCOUNTER — Ambulatory Visit (HOSPITAL_COMMUNITY)
Admission: RE | Admit: 2014-08-14 | Discharge: 2014-08-14 | Disposition: A | Discharge status: Home / Self Care | Payer: Medicare Other | Source: Ambulatory Visit | Attending: Radiation Oncology | Admitting: Radiation Oncology

## 2014-08-14 DIAGNOSIS — G319 Degenerative disease of nervous system, unspecified: Secondary | ICD-10-CM | POA: Diagnosis not present

## 2014-08-14 DIAGNOSIS — C3490 Malignant neoplasm of unspecified part of unspecified bronchus or lung: Secondary | ICD-10-CM | POA: Diagnosis not present

## 2014-08-14 DIAGNOSIS — I6782 Cerebral ischemia: Secondary | ICD-10-CM | POA: Insufficient documentation

## 2014-08-14 MED ORDER — GADOBENATE DIMEGLUMINE 529 MG/ML IV SOLN
15.0000 mL | Freq: Once | INTRAVENOUS | Status: AC | PRN
  Administered 2014-08-14: 15 mL via INTRAVENOUS

## 2014-08-24 ENCOUNTER — Other Ambulatory Visit: Payer: Self-pay | Admitting: Dermatology

## 2014-08-24 DIAGNOSIS — Z85828 Personal history of other malignant neoplasm of skin: Secondary | ICD-10-CM | POA: Diagnosis not present

## 2014-08-24 DIAGNOSIS — C44729 Squamous cell carcinoma of skin of left lower limb, including hip: Secondary | ICD-10-CM | POA: Diagnosis not present

## 2014-08-24 DIAGNOSIS — L570 Actinic keratosis: Secondary | ICD-10-CM | POA: Diagnosis not present

## 2014-08-24 DIAGNOSIS — D0472 Carcinoma in situ of skin of left lower limb, including hip: Secondary | ICD-10-CM | POA: Diagnosis not present

## 2014-08-24 DIAGNOSIS — L821 Other seborrheic keratosis: Secondary | ICD-10-CM | POA: Diagnosis not present

## 2014-08-24 DIAGNOSIS — L720 Epidermal cyst: Secondary | ICD-10-CM | POA: Diagnosis not present

## 2014-09-10 ENCOUNTER — Ambulatory Visit (HOSPITAL_BASED_OUTPATIENT_CLINIC_OR_DEPARTMENT_OTHER): Payer: Medicare Other

## 2014-09-10 VITALS — BP 105/55 | HR 79 | Temp 99.0°F

## 2014-09-10 DIAGNOSIS — C3491 Malignant neoplasm of unspecified part of right bronchus or lung: Secondary | ICD-10-CM

## 2014-09-10 DIAGNOSIS — Z95828 Presence of other vascular implants and grafts: Secondary | ICD-10-CM

## 2014-09-10 DIAGNOSIS — Z452 Encounter for adjustment and management of vascular access device: Secondary | ICD-10-CM

## 2014-09-10 MED ORDER — SODIUM CHLORIDE 0.9 % IJ SOLN
10.0000 mL | INTRAMUSCULAR | Status: DC | PRN
  Administered 2014-09-10: 10 mL via INTRAVENOUS
  Filled 2014-09-10: qty 10

## 2014-09-10 MED ORDER — HEPARIN SOD (PORK) LOCK FLUSH 100 UNIT/ML IV SOLN
500.0000 [IU] | Freq: Once | INTRAVENOUS | Status: AC
  Administered 2014-09-10: 500 [IU] via INTRAVENOUS
  Filled 2014-09-10: qty 5

## 2014-09-10 NOTE — Patient Instructions (Signed)

## 2014-09-24 ENCOUNTER — Other Ambulatory Visit (HOSPITAL_BASED_OUTPATIENT_CLINIC_OR_DEPARTMENT_OTHER): Payer: Medicare Other

## 2014-09-24 ENCOUNTER — Ambulatory Visit (HOSPITAL_COMMUNITY)
Admission: RE | Admit: 2014-09-24 | Discharge: 2014-09-24 | Disposition: A | Discharge status: Home / Self Care | Payer: Medicare Other | Source: Ambulatory Visit | Attending: Internal Medicine | Admitting: Internal Medicine

## 2014-09-24 ENCOUNTER — Encounter (HOSPITAL_COMMUNITY): Payer: Self-pay

## 2014-09-24 DIAGNOSIS — C3491 Malignant neoplasm of unspecified part of right bronchus or lung: Secondary | ICD-10-CM

## 2014-09-24 DIAGNOSIS — J90 Pleural effusion, not elsewhere classified: Secondary | ICD-10-CM | POA: Diagnosis not present

## 2014-09-24 DIAGNOSIS — C3490 Malignant neoplasm of unspecified part of unspecified bronchus or lung: Secondary | ICD-10-CM | POA: Diagnosis not present

## 2014-09-24 LAB — CBC WITH DIFFERENTIAL/PLATELET
BASO%: 0.2 % (ref 0.0–2.0)
Basophils Absolute: 0 10*3/uL (ref 0.0–0.1)
EOS%: 1.5 % (ref 0.0–7.0)
Eosinophils Absolute: 0.1 10*3/uL (ref 0.0–0.5)
HEMATOCRIT: 30.5 % — AB (ref 34.8–46.6)
HEMOGLOBIN: 9.8 g/dL — AB (ref 11.6–15.9)
LYMPH%: 13.7 % — ABNORMAL LOW (ref 14.0–49.7)
MCH: 30.3 pg (ref 25.1–34.0)
MCHC: 32.1 g/dL (ref 31.5–36.0)
MCV: 94.4 fL (ref 79.5–101.0)
MONO#: 0.6 10*3/uL (ref 0.1–0.9)
MONO%: 12.2 % (ref 0.0–14.0)
NEUT#: 3.4 10*3/uL (ref 1.5–6.5)
NEUT%: 72.4 % (ref 38.4–76.8)
Platelets: 182 10*3/uL (ref 145–400)
RBC: 3.23 10*6/uL — ABNORMAL LOW (ref 3.70–5.45)
RDW: 17.2 % — AB (ref 11.2–14.5)
WBC: 4.7 10*3/uL (ref 3.9–10.3)
lymph#: 0.6 10*3/uL — ABNORMAL LOW (ref 0.9–3.3)

## 2014-09-24 LAB — COMPREHENSIVE METABOLIC PANEL (CC13)
ALT: 8 U/L (ref 0–55)
ANION GAP: 10 meq/L (ref 3–11)
AST: 15 U/L (ref 5–34)
Albumin: 3.3 g/dL — ABNORMAL LOW (ref 3.5–5.0)
Alkaline Phosphatase: 74 U/L (ref 40–150)
BILIRUBIN TOTAL: 0.37 mg/dL (ref 0.20–1.20)
BUN: 18.1 mg/dL (ref 7.0–26.0)
CO2: 26 meq/L (ref 22–29)
CREATININE: 0.8 mg/dL (ref 0.6–1.1)
Calcium: 9 mg/dL (ref 8.4–10.4)
Chloride: 105 mEq/L (ref 98–109)
EGFR: 75 mL/min/{1.73_m2} — ABNORMAL LOW (ref >90)
GLUCOSE: 98 mg/dL (ref 70–140)
Potassium: 4 mEq/L (ref 3.5–5.1)
SODIUM: 141 meq/L (ref 136–145)
Total Protein: 7 g/dL (ref 6.4–8.3)

## 2014-09-24 MED ORDER — IOHEXOL 300 MG/ML  SOLN
100.0000 mL | Freq: Once | INTRAMUSCULAR | Status: AC | PRN
  Administered 2014-09-24: 100 mL via INTRAVENOUS

## 2014-10-01 ENCOUNTER — Telehealth: Payer: Self-pay | Admitting: Internal Medicine

## 2014-10-01 ENCOUNTER — Ambulatory Visit (HOSPITAL_BASED_OUTPATIENT_CLINIC_OR_DEPARTMENT_OTHER): Payer: Medicare Other | Admitting: Internal Medicine

## 2014-10-01 VITALS — BP 110/45 | HR 81 | Temp 98.3°F | Resp 18 | Ht 67.0 in | Wt 163.9 lb

## 2014-10-01 DIAGNOSIS — C342 Malignant neoplasm of middle lobe, bronchus or lung: Secondary | ICD-10-CM

## 2014-10-01 DIAGNOSIS — C3490 Malignant neoplasm of unspecified part of unspecified bronchus or lung: Secondary | ICD-10-CM

## 2014-10-01 NOTE — Telephone Encounter (Signed)
Gave avs & calendar for February Thru May.

## 2014-10-01 NOTE — Progress Notes (Signed)
[facility]  [phone] Fax:(336) [phone]  OFFICE PROGRESS NOTE   ARONSON,RICHARD A, MD [place] [address]  DIAGNOSIS: Small cell lung carcinoma, limited stage   Primary site: Lung (Right)   Staging method: AJCC 7th Edition   Clinical: Stage IIIA (T2a, N2, M0) signed by Curt Bears, MD on 08/06/2013  3:32 PM   Summary: Stage IIIA (T2a, N2, M0)  PRIOR THERAPY: 1) Systemic chemotherapy with carboplatin for an AUC of 5 given on day 1 and etoposide 120 mg per meter squared on days 1, 2 and 3 with Neulasta support given on day 4. This given concurrent with radiation. Status post 4 cycles 2) she declined prophylactic cranial irradiation.  CURRENT THERAPY: Observation.  DISEASE STAGE: Small cell lung carcinoma, limited stage   Primary site: Lung (Right)   Staging method: AJCC 7th Edition   Clinical: Stage IIIA (T2a, N2, M0) signed by Curt Bears, MD on 08/06/2013  3:32 PM   Summary: Stage IIIA (T2a, N2, M0)  CHEMOTHERAPY INTENT: curative  CURRENT # OF CHEMOTHERAPY CYCLES: 4  CURRENT ANTIEMETICS: Zofran, dexamethasone and Compazine  CURRENT SMOKING STATUS: Former smoker, quit 09/12/1991  ORAL CHEMOTHERAPY AND CONSENT: N/A  CURRENT BISPHOSPHONATES USE: None  PAIN MANAGEMENT: Hydrocodone and acetaminophen, tramadol  NARCOTICS INDUCED CONSTIPATION: Mild  LIVING WILL AND CODE STATUS: ?  INTERVAL HISTORY: Elizabeth Humphrey 81 y.o. female returns for followup visit accompanied by her husband. The patient is feeling fine today with no significant complaints. She denied having any significant chest pain, breath with exertion, cough or hemoptysis. The patient denied having any significant weight loss or night sweats. She denied having any nausea or vomiting, no fever or chills. She had repeat CT scan of the chest performed recently and she is here for evaluation and discussion of her scan results.  MEDICAL HISTORY: Past  Medical History  Diagnosis Date  . Allergy     bee stings/anaphylaxis  . Hyperlipidemia     hx of  . COPD (chronic obstructive pulmonary disease) 07/30/13  . Heart murmur   . History of pneumonia   . Arthritis   . Pyelonephritis   . Cough   . Lung cancer dx'd 07/2013  . History of radiation therapy 08/26/13-10/20/13    59.4 gray to central chest area    ALLERGIES:  has No Known Allergies.  MEDICATIONS:  Current Outpatient Prescriptions  Medication Sig Dispense Refill  . acetaminophen (TYLENOL) 325 MG tablet Take 650 mg by mouth every 6 (six) hours as needed (pain).    . cholecalciferol (VITAMIN D) 1000 UNITS tablet Take 1,000 Units by mouth daily.    [name] escitalopram (LEXAPRO) 10 MG tablet Take 10 mg by mouth daily. Taking 1/2 tablet for the first 8 days then will take a whole tablet.    . lidocaine-prilocaine (EMLA) cream Apply to Port-A-Cath one to 2 hours prior to chemotherapy as directed 30 g 1  . mupirocin ointment (BACTROBAN) 2 % Apply 1 application topically as needed. For cuts and scrapes    . polyethylene glycol (MIRALAX / GLYCOLAX) packet Take 17 g by mouth daily.    . rivastigmine (EXELON) 9.5 mg/24hr Place 9.5 mg onto the skin daily.    . simvastatin (ZOCOR) 40 MG tablet Take 40 mg by mouth at bedtime.    . traMADol (ULTRAM) 50 MG tablet Take 1 tablet by mouth every 6 (six) hours as needed (pain).      No current facility-administered medications for this visit.  SURGICAL HISTORY:  Past Surgical History  Procedure Laterality Date  . Dilation and curettage of uterus  yrs ago  . Tonsillectomy    . Cataract extraction      both eyes  . Video bronchoscopy with endobronchial ultrasound N/A 08/04/2013    Procedure: VIDEO BRONCHOSCOPY WITH ENDOBRONCHIAL ULTRASOUND;  Surgeon: Melrose Nakayama, MD;  Location: [facility];  Service: Thoracic;  Laterality: N/A;    REVIEW OF SYSTEMS:  A comprehensive review of systems was negative.   PHYSICAL EXAMINATION: General  appearance: alert, cooperative, appears stated age and no distress Head: Normocephalic, without obvious abnormality, atraumatic Neck: no adenopathy, no carotid bruit, no JVD, supple, symmetrical, trachea midline and thyroid not enlarged, symmetric, no tenderness/mass/nodules Lymph nodes: Cervical, supraclavicular, and axillary nodes normal. Resp: clear to auscultation bilaterally Back: symmetric, no curvature. ROM normal. No CVA tenderness. Cardio: regular rate and rhythm, S1, S2 normal, no murmur, click, rub or gallop GI: soft, non-tender; bowel sounds normal; no masses,  no organomegaly Extremities: extremities normal, atraumatic, no cyanosis or edema Neurologic: Alert and oriented X 3, normal strength and tone. Normal symmetric reflexes. Normal coordination and gait  ECOG PERFORMANCE STATUS: 1 - Symptomatic but completely ambulatory  Blood pressure 110/45, pulse 81, temperature 98.3 F (36.8 C), temperature source Oral, resp. rate 18, height 5\' 7"  (1.702 m), weight 163 lb 14.4 oz (74.345 kg), SpO2 99 %.  LABORATORY DATA: Lab Results  Component Value Date   WBC 4.7 09/24/2014   HGB 9.8* 09/24/2014   HCT 30.5* 09/24/2014   MCV 94.4 09/24/2014   PLT 182 09/24/2014      Chemistry      Component Value Date/Time   NA 141 09/24/2014 0956   NA 146 10/06/2013 0625   K 4.0 09/24/2014 0956   K 4.0 10/06/2013 0625   CL 108 10/06/2013 0625   CO2 26 09/24/2014 0956   CO2 23 10/06/2013 0625   BUN 18.1 09/24/2014 0956   BUN 42* 10/06/2013 0625   CREATININE 0.8 09/24/2014 0956   CREATININE 0.92 10/06/2013 0625      Component Value Date/Time   CALCIUM 9.0 09/24/2014 0956   CALCIUM 9.0 10/06/2013 0625   ALKPHOS 74 09/24/2014 0956   ALKPHOS 59 10/06/2013 0625   AST 15 09/24/2014 0956   AST 10 10/06/2013 0625   ALT 8 09/24/2014 0956   ALT 10 10/06/2013 0625   BILITOT 0.37 09/24/2014 0956   BILITOT 0.5 10/06/2013 0625       RADIOGRAPHIC STUDIES: Ct Chest W Contrast  09/24/2014    CLINICAL DATA:  Followup lung cancer.  EXAM: CT CHEST WITH CONTRAST  TECHNIQUE: Multidetector CT imaging of the chest was performed during intravenous contrast administration.  CONTRAST:  151mL OMNIPAQUE IOHEXOL 300 MG/ML  SOLN  COMPARISON:  06/18/2014  FINDINGS: Mediastinum: The heart size appears normal. No pericardial effusion identified. The trachea is patent and midline. Unremarkable appearance of the esophagus. Calcified atherosclerotic plaque involves the thoracic aorta as well as the RCA and LAD coronary arteries.  Lungs/Pleura: Index right paratracheal lymph node measures 7 mm, image 15/series 2. Previously 8 mm.  Right pleural effusion is unchanged in volume from previous examination. There is stable appearance of paramediastinal radiation change within the right lung.  Left upper lobe nodule measured 4 mm, image 33/series 5.Index nodule in the left lower lobe measures 3 mm, image 45/series 5. This is unchanged from previous exam. The adjacent index nodule 4 mm and is also unchanged from previous exam. Stable 4 mm  left base nodule, image 53/series 5. No new or enlarging pulmonary nodules or masses identified.  Upper Abdomen: The visualized portions of the liver appear normal. The visualized portions of the adrenal glands and pancreas are also unremarkable. Stable low-attenuation lesion within the spleen measuring 9 mm, image 55/ series 2. Likely benign.  Musculoskeletal: Review of the visualized osseous structures is significant for multi level thoracic spondylosis. No aggressive lytic or sclerotic bone lesions noted.  IMPRESSION: 1. Stable CT of the chest. 2. Stable appearance of right pleural effusion and radiation changes within the right lung. 3. Small pulmonary nodules in the left lung are unchanged. 4. Atherosclerotic disease including multi vessel coronary artery calcification.   Electronically Signed   By: Kerby Moors M.D.   On: 09/24/2014 13:29    ASSESSMENT/PLAN:  This is a very pleasant  81 years old white female was limited stage small cell lung cancer currently undergoing systemic chemotherapy with carboplatin and etoposide status post 4 cycles concurrent with radiation. She declined prophylactic cranial irradiation. Her recent CT scan of the chest showed no evidence for disease progression. I discussed the scan results with the patient and her husband. I recommended for her to continue on observation with repeat CT scan of the chest in 4 months. She will continue to have Port-A-Cath flush every 8 weeks. For the persistent anemia, I advised the patient to take over-the-counter iron supplements 1-2 tablets every day. She was advised to call immediately if she has any concerning symptoms in the interval. The patient and her husband agreed to the current plan.  Disclaimer: This note was dictated with voice recognition software. Similar sounding words can inadvertently be transcribed and may not be corrected upon review. Eilleen Kempf., MD 10/01/2014

## 2014-10-02 ENCOUNTER — Encounter: Payer: Self-pay | Admitting: Internal Medicine

## 2014-10-22 ENCOUNTER — Ambulatory Visit (HOSPITAL_BASED_OUTPATIENT_CLINIC_OR_DEPARTMENT_OTHER): Payer: Medicare Other

## 2014-10-22 ENCOUNTER — Telehealth: Payer: Self-pay | Admitting: Internal Medicine

## 2014-10-22 VITALS — BP 111/55 | HR 92 | Temp 97.8°F

## 2014-10-22 DIAGNOSIS — Z452 Encounter for adjustment and management of vascular access device: Secondary | ICD-10-CM

## 2014-10-22 DIAGNOSIS — C342 Malignant neoplasm of middle lobe, bronchus or lung: Secondary | ICD-10-CM | POA: Diagnosis not present

## 2014-10-22 DIAGNOSIS — Z95828 Presence of other vascular implants and grafts: Secondary | ICD-10-CM

## 2014-10-22 MED ORDER — HEPARIN SOD (PORK) LOCK FLUSH 100 UNIT/ML IV SOLN
500.0000 [IU] | Freq: Once | INTRAVENOUS | Status: AC
  Administered 2014-10-22: 500 [IU] via INTRAVENOUS
  Filled 2014-10-22: qty 5

## 2014-10-22 MED ORDER — SODIUM CHLORIDE 0.9 % IJ SOLN
10.0000 mL | INTRAMUSCULAR | Status: DC | PRN
  Administered 2014-10-22: 10 mL via INTRAVENOUS
  Filled 2014-10-22: qty 10

## 2014-10-22 NOTE — Telephone Encounter (Signed)
Gave calendar new calendar for May. Confirm appointment for 05/04

## 2014-10-22 NOTE — Patient Instructions (Signed)

## 2014-12-03 ENCOUNTER — Ambulatory Visit (HOSPITAL_BASED_OUTPATIENT_CLINIC_OR_DEPARTMENT_OTHER): Payer: Medicare Other

## 2014-12-03 VITALS — BP 98/58 | HR 106 | Temp 98.4°F

## 2014-12-03 DIAGNOSIS — C342 Malignant neoplasm of middle lobe, bronchus or lung: Secondary | ICD-10-CM | POA: Diagnosis not present

## 2014-12-03 DIAGNOSIS — Z452 Encounter for adjustment and management of vascular access device: Secondary | ICD-10-CM

## 2014-12-03 DIAGNOSIS — Z95828 Presence of other vascular implants and grafts: Secondary | ICD-10-CM

## 2014-12-03 MED ORDER — HEPARIN SOD (PORK) LOCK FLUSH 100 UNIT/ML IV SOLN
500.0000 [IU] | Freq: Once | INTRAVENOUS | Status: AC
  Administered 2014-12-03: 500 [IU] via INTRAVENOUS
  Filled 2014-12-03: qty 5

## 2014-12-03 MED ORDER — SODIUM CHLORIDE 0.9 % IJ SOLN
10.0000 mL | INTRAMUSCULAR | Status: DC | PRN
  Administered 2014-12-03: 10 mL via INTRAVENOUS
  Filled 2014-12-03: qty 10

## 2014-12-03 NOTE — Patient Instructions (Signed)

## 2014-12-17 ENCOUNTER — Other Ambulatory Visit: Payer: Medicare Other

## 2014-12-24 ENCOUNTER — Ambulatory Visit: Payer: Medicare Other | Admitting: Internal Medicine

## 2015-01-12 DIAGNOSIS — M4316 Spondylolisthesis, lumbar region: Secondary | ICD-10-CM | POA: Diagnosis not present

## 2015-01-12 DIAGNOSIS — Z6827 Body mass index (BMI) 27.0-27.9, adult: Secondary | ICD-10-CM | POA: Diagnosis not present

## 2015-01-12 DIAGNOSIS — C3490 Malignant neoplasm of unspecified part of unspecified bronchus or lung: Secondary | ICD-10-CM | POA: Diagnosis not present

## 2015-01-12 DIAGNOSIS — S22080A Wedge compression fracture of T11-T12 vertebra, initial encounter for closed fracture: Secondary | ICD-10-CM | POA: Diagnosis not present

## 2015-01-12 DIAGNOSIS — M545 Low back pain: Secondary | ICD-10-CM | POA: Diagnosis not present

## 2015-01-12 DIAGNOSIS — M47816 Spondylosis without myelopathy or radiculopathy, lumbar region: Secondary | ICD-10-CM | POA: Diagnosis not present

## 2015-01-12 DIAGNOSIS — I714 Abdominal aortic aneurysm, without rupture: Secondary | ICD-10-CM | POA: Diagnosis not present

## 2015-01-13 ENCOUNTER — Other Ambulatory Visit (HOSPITAL_BASED_OUTPATIENT_CLINIC_OR_DEPARTMENT_OTHER): Payer: Medicare Other

## 2015-01-13 ENCOUNTER — Ambulatory Visit (HOSPITAL_COMMUNITY)
Admission: RE | Admit: 2015-01-13 | Discharge: 2015-01-13 | Disposition: A | Discharge status: Home / Self Care | Payer: Medicare Other | Source: Ambulatory Visit | Attending: Internal Medicine | Admitting: Internal Medicine

## 2015-01-13 ENCOUNTER — Encounter (HOSPITAL_COMMUNITY): Payer: Self-pay

## 2015-01-13 ENCOUNTER — Other Ambulatory Visit: Payer: Medicare Other

## 2015-01-13 ENCOUNTER — Ambulatory Visit (HOSPITAL_BASED_OUTPATIENT_CLINIC_OR_DEPARTMENT_OTHER): Payer: Medicare Other

## 2015-01-13 DIAGNOSIS — Z9181 History of falling: Secondary | ICD-10-CM | POA: Diagnosis not present

## 2015-01-13 DIAGNOSIS — C342 Malignant neoplasm of middle lobe, bronchus or lung: Secondary | ICD-10-CM

## 2015-01-13 DIAGNOSIS — Z452 Encounter for adjustment and management of vascular access device: Secondary | ICD-10-CM

## 2015-01-13 DIAGNOSIS — C3490 Malignant neoplasm of unspecified part of unspecified bronchus or lung: Secondary | ICD-10-CM

## 2015-01-13 DIAGNOSIS — Z95828 Presence of other vascular implants and grafts: Secondary | ICD-10-CM

## 2015-01-13 DIAGNOSIS — J449 Chronic obstructive pulmonary disease, unspecified: Secondary | ICD-10-CM | POA: Diagnosis not present

## 2015-01-13 LAB — COMPREHENSIVE METABOLIC PANEL (CC13)
ALBUMIN: 3.7 g/dL (ref 3.5–5.0)
ALT: 12 U/L (ref 0–55)
AST: 17 U/L (ref 5–34)
Alkaline Phosphatase: 78 U/L (ref 40–150)
Anion Gap: 11 mEq/L (ref 3–11)
BUN: 21.2 mg/dL (ref 7.0–26.0)
CALCIUM: 9.2 mg/dL (ref 8.4–10.4)
CHLORIDE: 107 meq/L (ref 98–109)
CO2: 23 mEq/L (ref 22–29)
Creatinine: 0.7 mg/dL (ref 0.6–1.1)
EGFR: 77 mL/min/{1.73_m2} — ABNORMAL LOW (ref >90)
Glucose: 91 mg/dl (ref 70–140)
POTASSIUM: 4.1 meq/L (ref 3.5–5.1)
SODIUM: 141 meq/L (ref 136–145)
Total Bilirubin: 0.28 mg/dL (ref 0.20–1.20)
Total Protein: 7 g/dL (ref 6.4–8.3)

## 2015-01-13 LAB — CBC WITH DIFFERENTIAL/PLATELET
BASO%: 0.2 % (ref 0.0–2.0)
Basophils Absolute: 0 10*3/uL (ref 0.0–0.1)
EOS%: 1.9 % (ref 0.0–7.0)
Eosinophils Absolute: 0.1 10*3/uL (ref 0.0–0.5)
HEMATOCRIT: 33.1 % — AB (ref 34.8–46.6)
HGB: 10.8 g/dL — ABNORMAL LOW (ref 11.6–15.9)
LYMPH%: 16.5 % (ref 14.0–49.7)
MCH: 31.4 pg (ref 25.1–34.0)
MCHC: 32.6 g/dL (ref 31.5–36.0)
MCV: 96.2 fL (ref 79.5–101.0)
MONO#: 0.6 10*3/uL (ref 0.1–0.9)
MONO%: 12.2 % (ref 0.0–14.0)
NEUT#: 3.6 10*3/uL (ref 1.5–6.5)
NEUT%: 69.2 % (ref 38.4–76.8)
Platelets: 174 10*3/uL (ref 145–400)
RBC: 3.44 10*6/uL — ABNORMAL LOW (ref 3.70–5.45)
RDW: 16.5 % — ABNORMAL HIGH (ref 11.2–14.5)
WBC: 5.3 10*3/uL (ref 3.9–10.3)
lymph#: 0.9 10*3/uL (ref 0.9–3.3)

## 2015-01-13 MED ORDER — SODIUM CHLORIDE 0.9 % IJ SOLN
10.0000 mL | INTRAMUSCULAR | Status: DC | PRN
  Administered 2015-01-13: 10 mL via INTRAVENOUS
  Filled 2015-01-13: qty 10

## 2015-01-13 MED ORDER — HEPARIN SOD (PORK) LOCK FLUSH 100 UNIT/ML IV SOLN
500.0000 [IU] | Freq: Once | INTRAVENOUS | Status: AC
  Administered 2015-01-13: 500 [IU] via INTRAVENOUS
  Filled 2015-01-13: qty 5

## 2015-01-13 MED ORDER — IOHEXOL 300 MG/ML  SOLN
100.0000 mL | Freq: Once | INTRAMUSCULAR | Status: AC | PRN
  Administered 2015-01-13: 80 mL via INTRAVENOUS

## 2015-01-13 NOTE — Patient Instructions (Signed)

## 2015-01-14 ENCOUNTER — Ambulatory Visit (HOSPITAL_COMMUNITY): Payer: Medicare Other

## 2015-01-14 ENCOUNTER — Other Ambulatory Visit: Payer: Medicare Other

## 2015-01-21 ENCOUNTER — Telehealth: Payer: Self-pay | Admitting: Internal Medicine

## 2015-01-21 ENCOUNTER — Encounter: Payer: Self-pay | Admitting: *Deleted

## 2015-01-21 ENCOUNTER — Ambulatory Visit (HOSPITAL_BASED_OUTPATIENT_CLINIC_OR_DEPARTMENT_OTHER): Payer: Medicare Other | Admitting: Internal Medicine

## 2015-01-21 ENCOUNTER — Encounter: Payer: Self-pay | Admitting: Internal Medicine

## 2015-01-21 VITALS — BP 106/49 | HR 89 | Temp 98.0°F | Resp 18 | Ht 67.0 in | Wt 174.0 lb

## 2015-01-21 DIAGNOSIS — D649 Anemia, unspecified: Secondary | ICD-10-CM

## 2015-01-21 DIAGNOSIS — C342 Malignant neoplasm of middle lobe, bronchus or lung: Secondary | ICD-10-CM | POA: Diagnosis not present

## 2015-01-21 DIAGNOSIS — C3490 Malignant neoplasm of unspecified part of unspecified bronchus or lung: Secondary | ICD-10-CM

## 2015-01-21 NOTE — Progress Notes (Signed)
[facility]  [phone] Fax:(336) [phone]  OFFICE PROGRESS NOTE   ARONSON,RICHARD A, MD [place] [address]  DIAGNOSIS: Small cell lung carcinoma, limited stage   Primary site: Lung (Right)   Staging method: AJCC 7th Edition   Clinical: Stage IIIA (T2a, N2, M0) signed by Curt Bears, MD on 08/06/2013  3:32 PM   Summary: Stage IIIA (T2a, N2, M0)  PRIOR THERAPY: 1) Systemic chemotherapy with carboplatin for an AUC of 5 given on day 1 and etoposide 120 mg per meter squared on days 1, 2 and 3 with Neulasta support given on day 4. This given concurrent with radiation. Status post 4 cycles 2) she declined prophylactic cranial irradiation.  CURRENT THERAPY: Observation.  DISEASE STAGE: Small cell lung carcinoma, limited stage   Primary site: Lung (Right)   Staging method: AJCC 7th Edition   Clinical: Stage IIIA (T2a, N2, M0) signed by Curt Bears, MD on 08/06/2013  3:32 PM   Summary: Stage IIIA (T2a, N2, M0)  CHEMOTHERAPY INTENT: curative  CURRENT # OF CHEMOTHERAPY CYCLES: 4  CURRENT ANTIEMETICS: Zofran, dexamethasone and Compazine  CURRENT SMOKING STATUS: Former smoker, quit 09/12/1991  ORAL CHEMOTHERAPY AND CONSENT: N/A  CURRENT BISPHOSPHONATES USE: None  PAIN MANAGEMENT: Hydrocodone and acetaminophen, tramadol  NARCOTICS INDUCED CONSTIPATION: Mild  LIVING WILL AND CODE STATUS: ?  INTERVAL HISTORY: Elizabeth Humphrey 81 y.o. female returns for followup visit accompanied by her husband. The patient is feeling fine today with no significant complaints. She denied having any significant chest pain, breath with exertion, cough or hemoptysis. The patient denied having any significant weight loss or night sweats. She denied having any nausea or vomiting, no fever or chills. She had repeat CT scan of the chest performed recently and she is here for evaluation and discussion of her scan results.  MEDICAL HISTORY: Past  Medical History  Diagnosis Date  . Allergy     bee stings/anaphylaxis  . Hyperlipidemia     hx of  . COPD (chronic obstructive pulmonary disease) 07/30/13  . Heart murmur   . History of pneumonia   . Arthritis   . Pyelonephritis   . Cough   . Lung cancer dx'd 07/2013  . History of radiation therapy 08/26/13-10/20/13    59.4 gray to central chest area    ALLERGIES:  has No Known Allergies.  MEDICATIONS:  Current Outpatient Prescriptions  Medication Sig Dispense Refill  . acetaminophen (TYLENOL) 325 MG tablet Take 650 mg by mouth every 6 (six) hours as needed (pain).    . cholecalciferol (VITAMIN D) 1000 UNITS tablet Take 1,000 Units by mouth daily.    [name] escitalopram (LEXAPRO) 10 MG tablet Take 10 mg by mouth daily. Taking 1/2 tablet for the first 8 days then will take a whole tablet.    . lidocaine-prilocaine (EMLA) cream Apply to Port-A-Cath one to 2 hours prior to chemotherapy as directed 30 g 1  . mupirocin ointment (BACTROBAN) 2 % Apply 1 application topically as needed. For cuts and scrapes    . polyethylene glycol (MIRALAX / GLYCOLAX) packet Take 17 g by mouth daily.    . rivastigmine (EXELON) 9.5 mg/24hr Place 9.5 mg onto the skin daily.    . simvastatin (ZOCOR) 40 MG tablet Take 40 mg by mouth at bedtime.    . traMADol (ULTRAM) 50 MG tablet Take 1 tablet by mouth every 6 (six) hours as needed (pain).      No current facility-administered medications for this visit.  SURGICAL HISTORY:  Past Surgical History  Procedure Laterality Date  . Dilation and curettage of uterus  yrs ago  . Tonsillectomy    . Cataract extraction      both eyes  . Video bronchoscopy with endobronchial ultrasound N/A 08/04/2013    Procedure: VIDEO BRONCHOSCOPY WITH ENDOBRONCHIAL ULTRASOUND;  Surgeon: Melrose Nakayama, MD;  Location: [facility];  Service: Thoracic;  Laterality: N/A;    REVIEW OF SYSTEMS:  A comprehensive review of systems was negative.   PHYSICAL EXAMINATION: General  appearance: alert, cooperative, appears stated age and no distress Head: Normocephalic, without obvious abnormality, atraumatic Neck: no adenopathy, no carotid bruit, no JVD, supple, symmetrical, trachea midline and thyroid not enlarged, symmetric, no tenderness/mass/nodules Lymph nodes: Cervical, supraclavicular, and axillary nodes normal. Resp: clear to auscultation bilaterally Back: symmetric, no curvature. ROM normal. No CVA tenderness. Cardio: regular rate and rhythm, S1, S2 normal, no murmur, click, rub or gallop GI: soft, non-tender; bowel sounds normal; no masses,  no organomegaly Extremities: extremities normal, atraumatic, no cyanosis or edema Neurologic: Alert and oriented X 3, normal strength and tone. Normal symmetric reflexes. Normal coordination and gait  ECOG PERFORMANCE STATUS: 1 - Symptomatic but completely ambulatory  Blood pressure 106/49, pulse 89, temperature 98 F (36.7 C), temperature source Oral, resp. rate 18, height '5\' 7"'$  (1.702 m), weight 174 lb (78.926 kg), SpO2 96 %.  LABORATORY DATA: Lab Results  Component Value Date   WBC 5.3 01/13/2015   HGB 10.8* 01/13/2015   HCT 33.1* 01/13/2015   MCV 96.2 01/13/2015   PLT 174 01/13/2015      Chemistry      Component Value Date/Time   NA 141 01/13/2015 0916   NA 146 10/06/2013 0625   K 4.1 01/13/2015 0916   K 4.0 10/06/2013 0625   CL 108 10/06/2013 0625   CO2 23 01/13/2015 0916   CO2 23 10/06/2013 0625   BUN 21.2 01/13/2015 0916   BUN 42* 10/06/2013 0625   CREATININE 0.7 01/13/2015 0916   CREATININE 0.92 10/06/2013 0625      Component Value Date/Time   CALCIUM 9.2 01/13/2015 0916   CALCIUM 9.0 10/06/2013 0625   ALKPHOS 78 01/13/2015 0916   ALKPHOS 59 10/06/2013 0625   AST 17 01/13/2015 0916   AST 10 10/06/2013 0625   ALT 12 01/13/2015 0916   ALT 10 10/06/2013 0625   BILITOT 0.28 01/13/2015 0916   BILITOT 0.5 10/06/2013 0625       RADIOGRAPHIC STUDIES: Ct Chest W Contrast  01/13/2015    CLINICAL DATA:  Lung cancer diagnosed 11/14. Chemotherapy and radiation therapy completed. Recent fall with spine fracture. COPD.  EXAM: CT CHEST WITH CONTRAST  TECHNIQUE: Multidetector CT imaging of the chest was performed during intravenous contrast administration.  CONTRAST:  50m OMNIPAQUE IOHEXOL 300 MG/ML  SOLN  COMPARISON:  09/24/2014  FINDINGS: Mediastinum/Nodes: No supraclavicular adenopathy. Aortic and branch vessel atherosclerosis. Normal heart size, without pericardial effusion. Multivessel coronary artery atherosclerosis. No central pulmonary embolism, on this non-dedicated study. No mediastinal or hilar adenopathy.  Lungs/Pleura: Similar size of a small right pleural effusion. May be minimal loculation laterally on image 34 of series 2.  Right apical pleural parenchymal scarring. Moderate centrilobular emphysema.  Right paramediastinal radiation fibrosis is similar in configuration and distribution. No locally recurrent disease.  4 mm left upper lobe pulmonary nodule on image 26 is unchanged.  Minimal motion degradation inferiorly. Left lower lobe pulmonary nodules including anteriorly at 3 mm on image 33 and posteriorly at  4 mm on image 32 are similar.  No enlarging nodules are identified. The inferior-most left lower lobe pulmonary nodule on the prior exam is not readily apparent.  Upper abdomen: Normal imaged portions of the liver, stomach, pancreas, adrenal glands, kidneys. A subcentimeter subcapsular splenic lesion is similar and of no clinical significance. Normal imaged gallbladder.  Musculoskeletal: No acute osseous abnormality.  IMPRESSION: 1. Right paramediastinal radiation changes, without locally recurrent or metastatic disease. 2. Similar small right pleural effusion. There may be minimal loculation laterally. 3. Left-sided pulmonary nodules are primarily similar. the inferior 4 mm nodule is not well visualized. This could have resolved or not be well evaluated secondary to Mild motion in  this area. 4.  Atherosclerosis, including within the coronary arteries.   Electronically Signed   By: Abigail Miyamoto M.D.   On: 01/13/2015 12:11    ASSESSMENT/PLAN:  This is a very pleasant 81 years old white female was limited stage small cell lung cancer currently undergoing systemic chemotherapy with carboplatin and etoposide status post 4 cycles concurrent with radiation. She declined prophylactic cranial irradiation. Her recent CT scan of the chest showed no evidence for disease progression. I discussed the scan results with the patient and her husband. I recommended for her to continue on observation with repeat CT scan of the chest in 4 months. She will continue to have Port-A-Cath flush every 8 weeks. For the persistent anemia, I advised the patient to take over-the-counter iron supplements 1-2 tablets every day. She was advised to call immediately if she has any concerning symptoms in the interval. The patient and her husband agreed to the current plan.  Disclaimer: This note was dictated with voice recognition software. Similar sounding words can inadvertently be transcribed and may not be corrected upon review. Eilleen Kempf., MD 10/01/2014      [facility]  Telephone:(336) [phone] Fax:(336) [phone]  OFFICE PROGRESS NOTE   ARONSON,RICHARD A, MD [place] [address]  DIAGNOSIS: Small cell lung carcinoma, limited stage   Primary site: Lung (Right)   Staging method: AJCC 7th Edition   Clinical: Stage IIIA (T2a, N2, M0) signed by Curt Bears, MD on 08/06/2013  3:32 PM   Summary: Stage IIIA (T2a, N2, M0)  PRIOR THERAPY: 1) Systemic chemotherapy with carboplatin for an AUC of 5 given on day 1 and etoposide 120 mg per meter squared on days 1, 2 and 3 with Neulasta support given on day 4. This given concurrent with radiation. Status post 4 cycles 2) she declined prophylactic cranial irradiation.  CURRENT THERAPY: Observation.  DISEASE  STAGE: Small cell lung carcinoma, limited stage   Primary site: Lung (Right)   Staging method: AJCC 7th Edition   Clinical: Stage IIIA (T2a, N2, M0) signed by Curt Bears, MD on 08/06/2013  3:32 PM   Summary: Stage IIIA (T2a, N2, M0)  CHEMOTHERAPY INTENT: curative  CURRENT # OF CHEMOTHERAPY CYCLES: 0  CURRENT ANTIEMETICS: Zofran, dexamethasone and Compazine  CURRENT SMOKING STATUS: Former smoker, quit 09/12/1991  ORAL CHEMOTHERAPY AND CONSENT: N/A  CURRENT BISPHOSPHONATES USE: None  PAIN MANAGEMENT: Hydrocodone and acetaminophen, tramadol  NARCOTICS INDUCED CONSTIPATION: Mild  LIVING WILL AND CODE STATUS: ?  INTERVAL HISTORY: Elizabeth Humphrey 81 y.o. female returns for followup visit accompanied by her husband. The patient is feeling fine today with no significant complaints. She denied having any significant chest pain, shortness of breath, cough or hemoptysis. The patient denied having any significant weight loss or night sweats. She denied having  any nausea or vomiting, no fever or chills. She had repeat CT scan of the chest performed recently and she is here for evaluation and discussion of her scan results.  MEDICAL HISTORY: Past Medical History  Diagnosis Date  . Allergy     bee stings/anaphylaxis  . Hyperlipidemia     hx of  . COPD (chronic obstructive pulmonary disease) 07/30/13  . Heart murmur   . History of pneumonia   . Arthritis   . Pyelonephritis   . Cough   . Lung cancer dx'd 07/2013  . History of radiation therapy 08/26/13-10/20/13    59.4 gray to central chest area    ALLERGIES:  has No Known Allergies.  MEDICATIONS:  Current Outpatient Prescriptions  Medication Sig Dispense Refill  . acetaminophen (TYLENOL) 325 MG tablet Take 650 mg by mouth every 6 (six) hours as needed (pain).    . cholecalciferol (VITAMIN D) 1000 UNITS tablet Take 1,000 Units by mouth daily.    [name] escitalopram (LEXAPRO) 10 MG tablet Take 10 mg by mouth daily. Taking 1/2  tablet for the first 8 days then will take a whole tablet.    . lidocaine-prilocaine (EMLA) cream Apply to Port-A-Cath one to 2 hours prior to chemotherapy as directed 30 g 1  . mupirocin ointment (BACTROBAN) 2 % Apply 1 application topically as needed. For cuts and scrapes    . polyethylene glycol (MIRALAX / GLYCOLAX) packet Take 17 g by mouth daily.    . rivastigmine (EXELON) 9.5 mg/24hr Place 9.5 mg onto the skin daily.    . simvastatin (ZOCOR) 40 MG tablet Take 40 mg by mouth at bedtime.    . traMADol (ULTRAM) 50 MG tablet Take 1 tablet by mouth every 6 (six) hours as needed (pain).      No current facility-administered medications for this visit.    SURGICAL HISTORY:  Past Surgical History  Procedure Laterality Date  . Dilation and curettage of uterus  yrs ago  . Tonsillectomy    . Cataract extraction      both eyes  . Video bronchoscopy with endobronchial ultrasound N/A 08/04/2013    Procedure: VIDEO BRONCHOSCOPY WITH ENDOBRONCHIAL ULTRASOUND;  Surgeon: Melrose Nakayama, MD;  Location: [facility];  Service: Thoracic;  Laterality: N/A;    REVIEW OF SYSTEMS:  A comprehensive review of systems was negative.   PHYSICAL EXAMINATION: General appearance: alert, cooperative, appears stated age and no distress Head: Normocephalic, without obvious abnormality, atraumatic Neck: no adenopathy, no carotid bruit, no JVD, supple, symmetrical, trachea midline and thyroid not enlarged, symmetric, no tenderness/mass/nodules Lymph nodes: Cervical, supraclavicular, and axillary nodes normal. Resp: clear to auscultation bilaterally Back: symmetric, no curvature. ROM normal. No CVA tenderness. Cardio: regular rate and rhythm, S1, S2 normal, no murmur, click, rub or gallop GI: soft, non-tender; bowel sounds normal; no masses,  no organomegaly Extremities: extremities normal, atraumatic, no cyanosis or edema Neurologic: Alert and oriented X 3, normal strength and tone. Normal symmetric reflexes. Normal  coordination and gait  ECOG PERFORMANCE STATUS: 1 - Symptomatic but completely ambulatory  Blood pressure 106/49, pulse 89, temperature 98 F (36.7 C), temperature source Oral, resp. rate 18, height '5\' 7"'$  (1.702 m), weight 174 lb (78.926 kg), SpO2 96 %.  LABORATORY DATA: Lab Results  Component Value Date   WBC 5.3 01/13/2015   HGB 10.8* 01/13/2015   HCT 33.1* 01/13/2015   MCV 96.2 01/13/2015   PLT 174 01/13/2015      Chemistry      Component Value  Date/Time   NA 141 01/13/2015 0916   NA 146 10/06/2013 0625   K 4.1 01/13/2015 0916   K 4.0 10/06/2013 0625   CL 108 10/06/2013 0625   CO2 23 01/13/2015 0916   CO2 23 10/06/2013 0625   BUN 21.2 01/13/2015 0916   BUN 42* 10/06/2013 0625   CREATININE 0.7 01/13/2015 0916   CREATININE 0.92 10/06/2013 0625      Component Value Date/Time   CALCIUM 9.2 01/13/2015 0916   CALCIUM 9.0 10/06/2013 0625   ALKPHOS 78 01/13/2015 0916   ALKPHOS 59 10/06/2013 0625   AST 17 01/13/2015 0916   AST 10 10/06/2013 0625   ALT 12 01/13/2015 0916   ALT 10 10/06/2013 0625   BILITOT 0.28 01/13/2015 0916   BILITOT 0.5 10/06/2013 0625       RADIOGRAPHIC STUDIES: Ct Chest W Contrast  01/13/2015   CLINICAL DATA:  Lung cancer diagnosed 11/14. Chemotherapy and radiation therapy completed. Recent fall with spine fracture. COPD.  EXAM: CT CHEST WITH CONTRAST  TECHNIQUE: Multidetector CT imaging of the chest was performed during intravenous contrast administration.  CONTRAST:  13m OMNIPAQUE IOHEXOL 300 MG/ML  SOLN  COMPARISON:  09/24/2014  FINDINGS: Mediastinum/Nodes: No supraclavicular adenopathy. Aortic and branch vessel atherosclerosis. Normal heart size, without pericardial effusion. Multivessel coronary artery atherosclerosis. No central pulmonary embolism, on this non-dedicated study. No mediastinal or hilar adenopathy.  Lungs/Pleura: Similar size of a small right pleural effusion. May be minimal loculation laterally on image 34 of series 2.  Right apical  pleural parenchymal scarring. Moderate centrilobular emphysema.  Right paramediastinal radiation fibrosis is similar in configuration and distribution. No locally recurrent disease.  4 mm left upper lobe pulmonary nodule on image 26 is unchanged.  Minimal motion degradation inferiorly. Left lower lobe pulmonary nodules including anteriorly at 3 mm on image 33 and posteriorly at 4 mm on image 32 are similar.  No enlarging nodules are identified. The inferior-most left lower lobe pulmonary nodule on the prior exam is not readily apparent.  Upper abdomen: Normal imaged portions of the liver, stomach, pancreas, adrenal glands, kidneys. A subcentimeter subcapsular splenic lesion is similar and of no clinical significance. Normal imaged gallbladder.  Musculoskeletal: No acute osseous abnormality.  IMPRESSION: 1. Right paramediastinal radiation changes, without locally recurrent or metastatic disease. 2. Similar small right pleural effusion. There may be minimal loculation laterally. 3. Left-sided pulmonary nodules are primarily similar. the inferior 4 mm nodule is not well visualized. This could have resolved or not be well evaluated secondary to Mild motion in this area. 4.  Atherosclerosis, including within the coronary arteries.   Electronically Signed   By: KAbigail MiyamotoM.D.   On: 01/13/2015 12:11    ASSESSMENT/PLAN:  This is a very pleasant 81years old white female was limited stage small cell lung cancer currently undergoing systemic chemotherapy with carboplatin and etoposide status post 4 cycles concurrent with radiation. She declined prophylactic cranial irradiation. Her recent CT scan of the chest showed no evidence for disease progression. I discussed the scan results with the patient and her husband. I recommended for her to continue on observation with repeat CT scan of the chest in 6 months. We also discussed removal of the Port-A-Cath today. The patient would like to have her Port-A-Cath removed. I  will refer her to interventional radiology for this procedure. For the persistent anemia, I advised the patient to take over-the-counter iron supplements 1-2 tablets every day. She was advised to call immediately if she has any  concerning symptoms in the interval. The patient and her husband agreed to the current plan.  Disclaimer: This note was dictated with voice recognition software. Similar sounding words can inadvertently be transcribed and may not be corrected upon review. Eilleen Kempf., MD

## 2015-01-21 NOTE — Telephone Encounter (Signed)
Gave and printed appt sched and avs for pt for May and NOV.Elizabeth KitchenMarland KitchenMarland Humphrey

## 2015-01-21 NOTE — [facility] Oncology Navigator Note (Unsigned)
Spoke with patient today at [facility].  Elizabeth Humphrey is doing very well today.  No complaints.  She is on observation and is very active she states.  She will have follow up in 6 months.  No barriers identified.

## 2015-01-25 ENCOUNTER — Other Ambulatory Visit: Payer: Self-pay | Admitting: Physician Assistant

## 2015-01-25 ENCOUNTER — Other Ambulatory Visit: Payer: Self-pay | Admitting: Radiology

## 2015-01-26 ENCOUNTER — Ambulatory Visit (HOSPITAL_COMMUNITY)
Admission: RE | Admit: 2015-01-26 | Discharge: 2015-01-26 | Disposition: A | Discharge status: Home / Self Care | Payer: Medicare Other | Source: Ambulatory Visit | Attending: Internal Medicine | Admitting: Internal Medicine

## 2015-01-26 ENCOUNTER — Other Ambulatory Visit: Payer: Self-pay | Admitting: Internal Medicine

## 2015-01-26 ENCOUNTER — Encounter (HOSPITAL_COMMUNITY): Payer: Self-pay

## 2015-01-26 DIAGNOSIS — I2510 Atherosclerotic heart disease of native coronary artery without angina pectoris: Secondary | ICD-10-CM | POA: Diagnosis not present

## 2015-01-26 DIAGNOSIS — Z923 Personal history of irradiation: Secondary | ICD-10-CM | POA: Insufficient documentation

## 2015-01-26 DIAGNOSIS — Z9221 Personal history of antineoplastic chemotherapy: Secondary | ICD-10-CM | POA: Insufficient documentation

## 2015-01-26 DIAGNOSIS — Z87891 Personal history of nicotine dependence: Secondary | ICD-10-CM | POA: Insufficient documentation

## 2015-01-26 DIAGNOSIS — Z7982 Long term (current) use of aspirin: Secondary | ICD-10-CM | POA: Diagnosis not present

## 2015-01-26 DIAGNOSIS — Z5181 Encounter for therapeutic drug level monitoring: Secondary | ICD-10-CM | POA: Insufficient documentation

## 2015-01-26 DIAGNOSIS — C3490 Malignant neoplasm of unspecified part of unspecified bronchus or lung: Secondary | ICD-10-CM | POA: Insufficient documentation

## 2015-01-26 DIAGNOSIS — Z79899 Other long term (current) drug therapy: Secondary | ICD-10-CM | POA: Insufficient documentation

## 2015-01-26 DIAGNOSIS — Z452 Encounter for adjustment and management of vascular access device: Secondary | ICD-10-CM | POA: Diagnosis not present

## 2015-01-26 DIAGNOSIS — J449 Chronic obstructive pulmonary disease, unspecified: Secondary | ICD-10-CM | POA: Diagnosis not present

## 2015-01-26 DIAGNOSIS — E785 Hyperlipidemia, unspecified: Secondary | ICD-10-CM | POA: Diagnosis not present

## 2015-01-26 LAB — CBC WITH DIFFERENTIAL/PLATELET
BASOS ABS: 0 10*3/uL (ref 0.0–0.1)
Basophils Relative: 1 % (ref 0–1)
EOS ABS: 0.1 10*3/uL (ref 0.0–0.7)
Eosinophils Relative: 2 % (ref 0–5)
HEMATOCRIT: 32.9 % — AB (ref 36.0–46.0)
Hemoglobin: 10.3 g/dL — ABNORMAL LOW (ref 12.0–15.0)
LYMPHS PCT: 16 % (ref 12–46)
Lymphs Abs: 0.6 10*3/uL — ABNORMAL LOW (ref 0.7–4.0)
MCH: 30.3 pg (ref 26.0–34.0)
MCHC: 31.3 g/dL (ref 30.0–36.0)
MCV: 96.8 fL (ref 78.0–100.0)
MONOS PCT: 12 % (ref 3–12)
Monocytes Absolute: 0.5 10*3/uL (ref 0.1–1.0)
Neutro Abs: 2.6 10*3/uL (ref 1.7–7.7)
Neutrophils Relative %: 69 % (ref 43–77)
PLATELETS: 169 10*3/uL (ref 150–400)
RBC: 3.4 MIL/uL — ABNORMAL LOW (ref 3.87–5.11)
RDW: 16.4 % — ABNORMAL HIGH (ref 11.5–15.5)
WBC: 3.8 10*3/uL — AB (ref 4.0–10.5)

## 2015-01-26 LAB — PROTIME-INR
INR: 1.01 (ref 0.00–1.49)
Prothrombin Time: 13.4 seconds (ref 11.6–15.2)

## 2015-01-26 LAB — APTT: aPTT: 41 seconds — ABNORMAL HIGH (ref 24–37)

## 2015-01-26 MED ORDER — SODIUM CHLORIDE 0.9 % IV SOLN: INTRAVENOUS | Status: DC

## 2015-01-26 MED ORDER — LIDOCAINE-EPINEPHRINE 2 %-1:100000 IJ SOLN
INTRAMUSCULAR | Status: AC
  Filled 2015-01-26: qty 1

## 2015-01-26 MED ORDER — CEFAZOLIN SODIUM-DEXTROSE 2-3 GM-% IV SOLR
2.0000 g | INTRAVENOUS | Status: AC
  Administered 2015-01-26: 2000 mg via INTRAVENOUS

## 2015-01-26 MED ORDER — CEFAZOLIN SODIUM-DEXTROSE 2-3 GM-% IV SOLR
INTRAVENOUS | Status: AC
  Administered 2015-01-26: 2000 mg via INTRAVENOUS
  Filled 2015-01-26: qty 50

## 2015-01-26 NOTE — Discharge Instructions (Signed)
You may remove the transparent dressing and gauze on 01/27/15. You have dermabond (skin glue) on your port a cath site. Do not scratch it. It will come off in about two weeks.

## 2015-01-26 NOTE — H&P (Signed)
Chief Complaint: "I'm here to get my port out"  Referring Physician(s): [name],[name]  History of Present Illness: Elizabeth Humphrey is a 81 y.o. female with history of limited stage small cell lung carcinoma and prior chemoradiation who is currently on observation status. Her most recent chest CT showed no evidence of disease progression. She presents today for port a cath removal.   Past Medical History  Diagnosis Date  . Allergy     bee stings/anaphylaxis  . Hyperlipidemia     hx of  . COPD (chronic obstructive pulmonary disease) 07/30/13  . Heart murmur   . History of pneumonia   . Arthritis   . Pyelonephritis   . Cough   . Lung cancer dx'd 07/2013  . History of radiation therapy 08/26/13-10/20/13    59.4 gray to central chest area    Past Surgical History  Procedure Laterality Date  . Dilation and curettage of uterus  yrs ago  . Tonsillectomy    . Cataract extraction      both eyes  . Video bronchoscopy with endobronchial ultrasound N/A 08/04/2013    Procedure: VIDEO BRONCHOSCOPY WITH ENDOBRONCHIAL ULTRASOUND;  Surgeon: Melrose Nakayama, MD;  Location: [facility] OR;  Service: Thoracic;  Laterality: N/A;    Allergies: Neosporin  Medications: Prior to Admission medications   Medication Sig Start Date End Date Taking? Authorizing Provider  acetaminophen (TYLENOL) 325 MG tablet Take 650 mg by mouth every 6 (six) hours as needed (pain).    Historical Provider, MD  aspirin EC 81 MG tablet Take 81 mg by mouth at bedtime.    Historical Provider, MD  escitalopram (LEXAPRO) 10 MG tablet Take 1 tablet by mouth daily. 01/05/15   Historical Provider, MD  ibuprofen (ADVIL,MOTRIN) 200 MG tablet Take 400 mg by mouth every 6 (six) hours as needed for headache or moderate pain.    Historical Provider, MD  lidocaine-prilocaine (EMLA) cream Apply to Port-A-Cath one to 2 hours prior to chemotherapy as directed 08/27/13   Adrena E Johnson, PA-C  rivastigmine (EXELON) 9.5 mg/24hr Place  1 patch onto the skin daily. 01/11/15   Historical Provider, MD  simvastatin (ZOCOR) 40 MG tablet Take 40 mg by mouth at bedtime. 01/22/12   Historical Provider, MD    Family History  Problem Relation Age of Onset  . Heart disease Mother   . Heart disease Father     History   Social History  . Marital Status: Married    Spouse Name: N/A  . Number of Children: 2  . Years of Education: N/A   Social History Main Topics  . Smoking status: Former Smoker -- 0.50 packs/day for 50 years    Types: Cigarettes    Quit date: 09/12/1991  . Smokeless tobacco: Never Used  . Alcohol Use: 3.5 oz/week    7 Standard drinks or equivalent per week     Comment: Vodka daily  . Drug Use: No  . Sexual Activity: Yes    Birth Control/ Protection: Other-see comments   Other Topics Concern  . Not on file   Social History Narrative      Review of Systems  Constitutional: Negative for fever and chills.  Respiratory: Negative for shortness of breath.        Occ cough  Cardiovascular: Negative for chest pain.  Gastrointestinal: Negative for nausea, vomiting, abdominal pain and blood in stool.  Genitourinary: Negative for dysuria and hematuria.  Musculoskeletal: Positive for back pain.  Neurological: Negative for headaches.  Vital Signs: BP 120/60  HR 85  R 16  TEMP 97.6  O2 SATS 98% RA  Physical Exam  Constitutional: She is oriented to person, place, and time. She appears well-developed and well-nourished.  Cardiovascular: Normal rate and regular rhythm.   Murmur heard. Clean, intact rt chest wall PAC  Pulmonary/Chest: Effort normal.  Distant BS bilat  Abdominal: Soft. Bowel sounds are normal. There is no tenderness.  Musculoskeletal: Normal range of motion. She exhibits no edema.  Neurological: She is alert and oriented to person, place, and time.    Imaging: Ct Chest W Contrast  01/13/2015   CLINICAL DATA:  Lung cancer diagnosed 11/14. Chemotherapy and radiation therapy completed.  Recent fall with spine fracture. COPD.  EXAM: CT CHEST WITH CONTRAST  TECHNIQUE: Multidetector CT imaging of the chest was performed during intravenous contrast administration.  CONTRAST:  46m OMNIPAQUE IOHEXOL 300 MG/ML  SOLN  COMPARISON:  09/24/2014  FINDINGS: Mediastinum/Nodes: No supraclavicular adenopathy. Aortic and branch vessel atherosclerosis. Normal heart size, without pericardial effusion. Multivessel coronary artery atherosclerosis. No central pulmonary embolism, on this non-dedicated study. No mediastinal or hilar adenopathy.  Lungs/Pleura: Similar size of a small right pleural effusion. May be minimal loculation laterally on image 34 of series 2.  Right apical pleural parenchymal scarring. Moderate centrilobular emphysema.  Right paramediastinal radiation fibrosis is similar in configuration and distribution. No locally recurrent disease.  4 mm left upper lobe pulmonary nodule on image 26 is unchanged.  Minimal motion degradation inferiorly. Left lower lobe pulmonary nodules including anteriorly at 3 mm on image 33 and posteriorly at 4 mm on image 32 are similar.  No enlarging nodules are identified. The inferior-most left lower lobe pulmonary nodule on the prior exam is not readily apparent.  Upper abdomen: Normal imaged portions of the liver, stomach, pancreas, adrenal glands, kidneys. A subcentimeter subcapsular splenic lesion is similar and of no clinical significance. Normal imaged gallbladder.  Musculoskeletal: No acute osseous abnormality.  IMPRESSION: 1. Right paramediastinal radiation changes, without locally recurrent or metastatic disease. 2. Similar small right pleural effusion. There may be minimal loculation laterally. 3. Left-sided pulmonary nodules are primarily similar. the inferior 4 mm nodule is not well visualized. This could have resolved or not be well evaluated secondary to Mild motion in this area. 4.  Atherosclerosis, including within the coronary arteries.   Electronically  Signed   By: KAbigail MiyamotoM.D.   On: 01/13/2015 12:11    Labs:  CBC:  Recent Labs  03/26/14 0924 06/18/14 0903 09/24/14 0956 01/13/15 0916  WBC 5.3 5.2 4.7 5.3  HGB 10.2* 9.9* 9.8* 10.8*  HCT 30.7* 29.8* 30.5* 33.1*  PLT 149 172 182 174    COAGS: No results for input(s): INR, APTT in the last 8760 hours.  BMP:  Recent Labs  03/26/14 0924 06/18/14 0903 07/30/14 1116 09/24/14 0956 01/13/15 0916  NA 142 143  --  141 141  K 3.9 3.9  --  4.0 4.1  CO2 24 28  --  26 23  GLUCOSE 122 105  --  98 91  BUN 13.6 13.5 17.8 18.1 21.2  CALCIUM 9.3 9.4  --  9.0 9.2  CREATININE 0.7 0.7 0.8 0.8 0.7    LIVER FUNCTION TESTS:  Recent Labs  03/26/14 0924 06/18/14 0903 09/24/14 0956 01/13/15 0916  BILITOT 0.48 0.39 0.37 0.28  AST '15 14 15 17  '$ ALT '9 10 8 12  '$ ALKPHOS 60 63 74 78  PROT 6.9 6.9 7.0 7.0  ALBUMIN 3.5  3.3* 3.3* 3.7    TUMOR MARKERS: No results for input(s): AFPTM, CEA, CA199, CHROMGRNA in the last 8760 hours.  Assessment and Plan: Elizabeth Humphrey is a 81 y.o. female with history of limited stage small cell lung carcinoma and prior chemoradiation who is currently on observation status. Her most recent chest CT showed no evidence of disease progression. She presents today for port a cath removal. Details/risks of procedure, including but not limited to internal bleeding, infection, discussed with pt/husband with their understanding and consent.   Signed: D. Rowe Robert 01/26/2015, 10:24 AM   I spent a total of 15 minutes face to face in clinical consultation, greater than 50% of which was counseling/coordinating care for port a cath removal

## 2015-01-26 NOTE — Procedures (Signed)
Successful RT IJ POWER PORT REMOVAL NO COMP STABLE FULL REPORT IN PACS

## 2015-03-08 ENCOUNTER — Other Ambulatory Visit: Payer: Self-pay

## 2015-04-27 ENCOUNTER — Encounter: Payer: Self-pay | Admitting: Gastroenterology

## 2015-06-28 ENCOUNTER — Other Ambulatory Visit: Payer: Self-pay

## 2015-06-28 DIAGNOSIS — Z1231 Encounter for screening mammogram for malignant neoplasm of breast: Secondary | ICD-10-CM

## 2015-06-29 ENCOUNTER — Ambulatory Visit
Admission: RE | Admit: 2015-06-29 | Discharge: 2015-06-29 | Disposition: A | Discharge status: Home / Self Care | Payer: Medicare Other | Source: Ambulatory Visit

## 2015-06-29 DIAGNOSIS — Z1231 Encounter for screening mammogram for malignant neoplasm of breast: Secondary | ICD-10-CM

## 2015-07-01 DIAGNOSIS — E785 Hyperlipidemia, unspecified: Secondary | ICD-10-CM | POA: Diagnosis not present

## 2015-07-01 DIAGNOSIS — N390 Urinary tract infection, site not specified: Secondary | ICD-10-CM | POA: Diagnosis not present

## 2015-07-01 DIAGNOSIS — R8290 Unspecified abnormal findings in urine: Secondary | ICD-10-CM | POA: Diagnosis not present

## 2015-07-01 DIAGNOSIS — R7301 Impaired fasting glucose: Secondary | ICD-10-CM | POA: Diagnosis not present

## 2015-07-06 ENCOUNTER — Other Ambulatory Visit: Payer: Self-pay | Admitting: Internal Medicine

## 2015-07-06 DIAGNOSIS — R928 Other abnormal and inconclusive findings on diagnostic imaging of breast: Secondary | ICD-10-CM

## 2015-07-07 DIAGNOSIS — H353131 Nonexudative age-related macular degeneration, bilateral, early dry stage: Secondary | ICD-10-CM | POA: Diagnosis not present

## 2015-07-07 DIAGNOSIS — H26493 Other secondary cataract, bilateral: Secondary | ICD-10-CM | POA: Diagnosis not present

## 2015-07-07 DIAGNOSIS — Z961 Presence of intraocular lens: Secondary | ICD-10-CM | POA: Diagnosis not present

## 2015-07-07 DIAGNOSIS — H52203 Unspecified astigmatism, bilateral: Secondary | ICD-10-CM | POA: Diagnosis not present

## 2015-07-08 ENCOUNTER — Other Ambulatory Visit: Payer: Medicare Other

## 2015-07-08 ENCOUNTER — Inpatient Hospital Stay: Admission: RE | Admit: 2015-07-08 | Payer: Medicare Other | Source: Ambulatory Visit

## 2015-07-08 DIAGNOSIS — R7301 Impaired fasting glucose: Secondary | ICD-10-CM | POA: Diagnosis not present

## 2015-07-08 DIAGNOSIS — C3490 Malignant neoplasm of unspecified part of unspecified bronchus or lung: Secondary | ICD-10-CM | POA: Diagnosis not present

## 2015-07-08 DIAGNOSIS — E785 Hyperlipidemia, unspecified: Secondary | ICD-10-CM | POA: Diagnosis not present

## 2015-07-08 DIAGNOSIS — Z6828 Body mass index (BMI) 28.0-28.9, adult: Secondary | ICD-10-CM | POA: Diagnosis not present

## 2015-07-08 DIAGNOSIS — M1990 Unspecified osteoarthritis, unspecified site: Secondary | ICD-10-CM | POA: Diagnosis not present

## 2015-07-08 DIAGNOSIS — M4800 Spinal stenosis, site unspecified: Secondary | ICD-10-CM | POA: Diagnosis not present

## 2015-07-08 DIAGNOSIS — N63 Unspecified lump in breast: Secondary | ICD-10-CM | POA: Diagnosis not present

## 2015-07-08 DIAGNOSIS — Z0000 Encounter for general adult medical examination without abnormal findings: Secondary | ICD-10-CM | POA: Diagnosis not present

## 2015-07-09 ENCOUNTER — Ambulatory Visit
Admission: RE | Admit: 2015-07-09 | Discharge: 2015-07-09 | Disposition: A | Discharge status: Home / Self Care | Payer: Medicare Other | Source: Ambulatory Visit | Attending: Internal Medicine | Admitting: Internal Medicine

## 2015-07-09 DIAGNOSIS — R928 Other abnormal and inconclusive findings on diagnostic imaging of breast: Secondary | ICD-10-CM

## 2015-07-09 DIAGNOSIS — N63 Unspecified lump in breast: Secondary | ICD-10-CM | POA: Diagnosis not present

## 2015-07-14 ENCOUNTER — Other Ambulatory Visit: Payer: Self-pay | Admitting: Internal Medicine

## 2015-07-14 ENCOUNTER — Other Ambulatory Visit: Payer: Self-pay | Admitting: Student

## 2015-07-14 DIAGNOSIS — M5416 Radiculopathy, lumbar region: Secondary | ICD-10-CM

## 2015-07-22 ENCOUNTER — Other Ambulatory Visit (HOSPITAL_BASED_OUTPATIENT_CLINIC_OR_DEPARTMENT_OTHER): Payer: Medicare Other

## 2015-07-22 ENCOUNTER — Ambulatory Visit (HOSPITAL_COMMUNITY)
Admission: RE | Admit: 2015-07-22 | Discharge: 2015-07-22 | Disposition: A | Discharge status: Home / Self Care | Payer: Medicare Other | Source: Ambulatory Visit | Attending: Internal Medicine | Admitting: Internal Medicine

## 2015-07-22 ENCOUNTER — Other Ambulatory Visit: Payer: BLUE CROSS/BLUE SHIELD

## 2015-07-22 ENCOUNTER — Ambulatory Visit: Payer: BLUE CROSS/BLUE SHIELD | Admitting: Internal Medicine

## 2015-07-22 ENCOUNTER — Other Ambulatory Visit: Payer: Self-pay | Admitting: *Deleted

## 2015-07-22 DIAGNOSIS — J90 Pleural effusion, not elsewhere classified: Secondary | ICD-10-CM | POA: Diagnosis not present

## 2015-07-22 DIAGNOSIS — C3490 Malignant neoplasm of unspecified part of unspecified bronchus or lung: Secondary | ICD-10-CM

## 2015-07-22 DIAGNOSIS — Z923 Personal history of irradiation: Secondary | ICD-10-CM | POA: Diagnosis not present

## 2015-07-22 DIAGNOSIS — M4854XA Collapsed vertebra, not elsewhere classified, thoracic region, initial encounter for fracture: Secondary | ICD-10-CM | POA: Diagnosis not present

## 2015-07-22 DIAGNOSIS — M954 Acquired deformity of chest and rib: Secondary | ICD-10-CM | POA: Diagnosis not present

## 2015-07-22 DIAGNOSIS — R918 Other nonspecific abnormal finding of lung field: Secondary | ICD-10-CM | POA: Insufficient documentation

## 2015-07-22 DIAGNOSIS — I700 Atherosclerosis of aorta: Secondary | ICD-10-CM | POA: Insufficient documentation

## 2015-07-22 LAB — CBC WITH DIFFERENTIAL/PLATELET
BASO%: 0.3 % (ref 0.0–2.0)
BASOS ABS: 0 10*3/uL (ref 0.0–0.1)
EOS%: 1.2 % (ref 0.0–7.0)
Eosinophils Absolute: 0.1 10*3/uL (ref 0.0–0.5)
HEMATOCRIT: 37.4 % (ref 34.8–46.6)
HGB: 12.6 g/dL (ref 11.6–15.9)
LYMPH#: 0.6 10*3/uL — AB (ref 0.9–3.3)
LYMPH%: 11.6 % — ABNORMAL LOW (ref 14.0–49.7)
MCH: 32.7 pg (ref 25.1–34.0)
MCHC: 33.7 g/dL (ref 31.5–36.0)
MCV: 96.8 fL (ref 79.5–101.0)
MONO#: 0.6 10*3/uL (ref 0.1–0.9)
MONO%: 12.3 % (ref 0.0–14.0)
NEUT#: 3.6 10*3/uL (ref 1.5–6.5)
NEUT%: 74.6 % (ref 38.4–76.8)
PLATELETS: 179 10*3/uL (ref 145–400)
RBC: 3.86 10*6/uL (ref 3.70–5.45)
RDW: 16.2 % — ABNORMAL HIGH (ref 11.2–14.5)
WBC: 4.9 10*3/uL (ref 3.9–10.3)

## 2015-07-22 LAB — COMPREHENSIVE METABOLIC PANEL (CC13)
ALT: 21 U/L (ref 0–55)
ANION GAP: 8 meq/L (ref 3–11)
AST: 23 U/L (ref 5–34)
Albumin: 3.8 g/dL (ref 3.5–5.0)
Alkaline Phosphatase: 66 U/L (ref 40–150)
BUN: 17.9 mg/dL (ref 7.0–26.0)
CHLORIDE: 105 meq/L (ref 98–109)
CO2: 26 meq/L (ref 22–29)
Calcium: 9.7 mg/dL (ref 8.4–10.4)
Creatinine: 0.8 mg/dL (ref 0.6–1.1)
EGFR: 67 mL/min/{1.73_m2} — AB (ref >90)
GLUCOSE: 112 mg/dL (ref 70–140)
POTASSIUM: 4.7 meq/L (ref 3.5–5.1)
SODIUM: 139 meq/L (ref 136–145)
Total Bilirubin: 0.44 mg/dL (ref 0.20–1.20)
Total Protein: 7.3 g/dL (ref 6.4–8.3)

## 2015-07-22 MED ORDER — IOHEXOL 300 MG/ML  SOLN
75.0000 mL | Freq: Once | INTRAMUSCULAR | Status: AC | PRN
  Administered 2015-07-22: 75 mL via INTRAVENOUS

## 2015-07-29 ENCOUNTER — Ambulatory Visit (HOSPITAL_BASED_OUTPATIENT_CLINIC_OR_DEPARTMENT_OTHER): Payer: Medicare Other | Admitting: Internal Medicine

## 2015-07-29 ENCOUNTER — Telehealth: Payer: Self-pay | Admitting: Internal Medicine

## 2015-07-29 ENCOUNTER — Encounter: Payer: Self-pay | Admitting: Internal Medicine

## 2015-07-29 VITALS — BP 114/54 | HR 93 | Temp 97.6°F | Resp 18 | Ht 67.0 in | Wt 173.6 lb

## 2015-07-29 DIAGNOSIS — C3491 Malignant neoplasm of unspecified part of right bronchus or lung: Secondary | ICD-10-CM | POA: Diagnosis not present

## 2015-07-29 NOTE — Telephone Encounter (Signed)
per pof tos ch pt appt-*gave pt copy of avs-[facility] will call to sch scan

## 2015-07-29 NOTE — Telephone Encounter (Signed)
see previous

## 2015-07-29 NOTE — Progress Notes (Signed)
Elizabeth Humphrey  Telephone:(336) [phone] Fax:(336) [phone]  OFFICE PROGRESS NOTE   Humphrey,Elizabeth A, MD [place] [address]  DIAGNOSIS: Small cell lung carcinoma, limited stage   Primary site: Lung (Right)   Staging method: AJCC 7th Edition   Clinical: Stage IIIA (T2a, N2, M0) signed by Curt Bears, MD on 08/06/2013  3:32 PM   Summary: Stage IIIA (T2a, N2, M0)  PRIOR THERAPY: 1) Systemic chemotherapy with carboplatin for an AUC of 5 given on day 1 and etoposide 120 mg per meter squared on days 1, 2 and 3 with Neulasta support given on day 4. This given concurrent with radiation. Status post 4 cycles 2) she declined prophylactic cranial irradiation.  CURRENT THERAPY: Observation.  DISEASE STAGE: Small cell lung carcinoma, limited stage   Primary site: Lung (Right)   Staging method: AJCC 7th Edition   Clinical: Stage IIIA (T2a, N2, M0) signed by Curt Bears, MD on 08/06/2013  3:32 PM   Summary: Stage IIIA (T2a, N2, M0)  CHEMOTHERAPY INTENT: curative  CURRENT # OF CHEMOTHERAPY CYCLES: 4  CURRENT ANTIEMETICS: Zofran, dexamethasone and Compazine  CURRENT SMOKING STATUS: Former smoker, quit 09/12/1991  ORAL CHEMOTHERAPY AND CONSENT: N/A  CURRENT BISPHOSPHONATES USE: None  PAIN MANAGEMENT: Hydrocodone and acetaminophen, tramadol  NARCOTICS INDUCED CONSTIPATION: Mild  LIVING WILL AND CODE STATUS: ?  INTERVAL HISTORY: Elizabeth Humphrey 81 y.o. female returns for followup visit accompanied by her husband. The patient is feeling fine today with no significant complaints. She denied having any significant chest pain, breath with exertion, cough or hemoptysis. The patient denied having any significant weight loss or night sweats. She denied having any nausea or vomiting, no fever or chills. She had repeat CT scan of the chest performed recently and she is here for evaluation and discussion of her scan results.  MEDICAL HISTORY: Past  Medical History  Diagnosis Date  . Allergy     bee stings/anaphylaxis  . Hyperlipidemia     hx of  . COPD (chronic obstructive pulmonary disease) 07/30/13  . Heart murmur   . History of pneumonia   . Arthritis   . Pyelonephritis   . Cough   . Lung cancer dx'd 07/2013  . History of radiation therapy 08/26/13-10/20/13    59.4 gray to central chest area    ALLERGIES:  is allergic to neosporin.  MEDICATIONS:  Current Outpatient Prescriptions  Medication Sig Dispense Refill  . acetaminophen (TYLENOL) 325 MG tablet Take 650 mg by mouth every 6 (six) hours as needed (pain).    [name] aspirin EC 81 MG tablet Take 81 mg by mouth at bedtime.    [name] escitalopram (LEXAPRO) 10 MG tablet Take 1 tablet by mouth daily.    [name] ibuprofen (ADVIL,MOTRIN) 200 MG tablet Take 400 mg by mouth every 6 (six) hours as needed for headache or moderate pain.    [name] lidocaine-prilocaine (EMLA) cream Apply to Port-A-Cath one to 2 hours prior to chemotherapy as directed 30 g 1  . rivastigmine (EXELON) 9.5 mg/24hr Place 1 patch onto the skin daily.    . simvastatin (ZOCOR) 40 MG tablet Take 40 mg by mouth at bedtime.     No current facility-administered medications for this visit.    SURGICAL HISTORY:  Past Surgical History  Procedure Laterality Date  . Dilation and curettage of uterus  yrs ago  . Tonsillectomy    . Cataract extraction      both eyes  . Video bronchoscopy with endobronchial ultrasound N/A  08/04/2013    Procedure: VIDEO BRONCHOSCOPY WITH ENDOBRONCHIAL ULTRASOUND;  Surgeon: Melrose Nakayama, MD;  Location: [facility];  Service: Thoracic;  Laterality: N/A;    REVIEW OF SYSTEMS:  A comprehensive review of systems was negative.   PHYSICAL EXAMINATION: General appearance: alert, cooperative, appears stated age and no distress Head: Normocephalic, without obvious abnormality, atraumatic Neck: no adenopathy, no carotid bruit, no JVD, supple, symmetrical, trachea midline and thyroid not enlarged, symmetric,  no tenderness/mass/nodules Lymph nodes: Cervical, supraclavicular, and axillary nodes normal. Resp: clear to auscultation bilaterally Back: symmetric, no curvature. ROM normal. No CVA tenderness. Cardio: regular rate and rhythm, S1, S2 normal, no murmur, click, rub or gallop GI: soft, non-tender; bowel sounds normal; no masses,  no organomegaly Extremities: extremities normal, atraumatic, no cyanosis or edema Neurologic: Alert and oriented X 3, normal strength and tone. Normal symmetric reflexes. Normal coordination and gait  ECOG PERFORMANCE STATUS: 1 - Symptomatic but completely ambulatory  Blood pressure 114/54, pulse 93, temperature 97.6 F (36.4 C), temperature source Oral, resp. rate 18, height '5\' 7"'$  (1.702 m), weight 173 lb 9.6 oz (78.744 kg), SpO2 99 %.  LABORATORY DATA: Lab Results  Component Value Date   WBC 4.9 07/22/2015   HGB 12.6 07/22/2015   HCT 37.4 07/22/2015   MCV 96.8 07/22/2015   PLT 179 07/22/2015      Chemistry      Component Value Date/Time   NA 139 07/22/2015 0908   NA 146 10/06/2013 0625   K 4.7 07/22/2015 0908   K 4.0 10/06/2013 0625   CL 108 10/06/2013 0625   CO2 26 07/22/2015 0908   CO2 23 10/06/2013 0625   BUN 17.9 07/22/2015 0908   BUN 42* 10/06/2013 0625   CREATININE 0.8 07/22/2015 0908   CREATININE 0.92 10/06/2013 0625      Component Value Date/Time   CALCIUM 9.7 07/22/2015 0908   CALCIUM 9.0 10/06/2013 0625   ALKPHOS 66 07/22/2015 0908   ALKPHOS 59 10/06/2013 0625   AST 23 07/22/2015 0908   AST 10 10/06/2013 0625   ALT 21 07/22/2015 0908   ALT 10 10/06/2013 0625   BILITOT 0.44 07/22/2015 0908   BILITOT 0.5 10/06/2013 0625       RADIOGRAPHIC STUDIES: Ct Chest W Contrast  07/22/2015  CLINICAL DATA:  Followup lung cancer. EXAM: CT CHEST WITH CONTRAST TECHNIQUE: Multidetector CT imaging of the chest was performed during intravenous contrast administration. CONTRAST:  51m OMNIPAQUE IOHEXOL 300 MG/ML  SOLN COMPARISON:  01/13/2015  FINDINGS: Mediastinum: The heart size appears within normal limits. No pericardial effusion identified. Aortic atherosclerosis noted. The trachea is patent and appears midline. Unremarkable appearance of the esophagus. No mediastinal or hilar adenopathy identified. Lungs/Pleura: Loculated pleural fluid is identified along the posterior aspect of the right midlung, image number 25 of series 5. This appears similar in volume to the previous exam. Paramediastinal radiation change is again identified. Small nonspecific nodules in the left lung appears stable from previous exam. Upper Abdomen: The adrenal glands both appear normal. On unremarkable appearance of the liver. The visualized portions of the kidneys an urinary bladder appear normal. Musculoskeletal: Spondylosis noted within the thoracic spine. There is a compression fracture within the T10 vertebra. This appears progressive when compared with 01/13/2015. No aggressive lytic or sclerotic bone lesions identified. IMPRESSION: 1. Stable CT of the chest. Right paramediastinal radiation change without specific findings to suggest residual or recurrence of tumor. 2. Stable loculated right pleural effusion. 3. No change in small left-sided pulmonary nodules.  4. Aortic atherosclerosis. 5. There is a age indeterminate compression deformity involving the T10 vertebra. This has progressed from 01/13/2015. Electronically Signed   By: Kerby Moors M.D.   On: 07/22/2015 11:35   US Breast Ltd Uni Left Inc Axilla  07/09/2015  CLINICAL DATA:  Patient returns today to evaluate possible masses within the retroareolar regions of each breasts. EXAM: DIGITAL DIAGNOSTIC BILATERAL MAMMOGRAM WITH 3D TOMOSYNTHESIS WITH CAD ULTRASOUND BILATERAL BREAST COMPARISON:  Previous screening mammogram dated 06/29/2015. There are no prior studies available. Patient had previous mammograms many years ago but these are no longer available. This is essentially a baseline mammogram. ACR Breast  Density Category a: The breast tissue is almost entirely fatty. FINDINGS: Bilateral CC and MLO views were obtained with tomosynthesis. On these additional views, multiple small circumscribed masses are confirmed within the retroareolar regions of each breast. Mammographic images were processed with CAD. Targeted ultrasound is performed, showing mildly prominent retroareolar ducts bilaterally without internal mass or vascularity. No suspicious solid or cystic masses are identified within either breast (retroareolar regions). IMPRESSION: Multiple small circumscribed masses within the retroareolar regions of each breast. The mildly prominent retroareolar ducts, without internal mass or vascularity, are probable sonographic correlates. As this is essentially patient's baseline mammogram (patient's previous mammograms from many years ago are no longer available), I recommend follow-up bilateral diagnostic mammograms in 6 months to ensure stability. RECOMMENDATION: Follow-up bilateral diagnostic mammograms, with possible ultrasound, in 6 months. I have discussed the findings and recommendations with the patient. Results were also provided in writing at the conclusion of the visit. If applicable, a reminder letter will be sent to the patient regarding the next appointment. BI-RADS CATEGORY  3: Probably benign. Electronically Signed   By: Franki Cabot M.D.   On: 07/09/2015 14:28   US Breast Ltd Uni Right Inc Axilla  07/09/2015  CLINICAL DATA:  Patient returns today to evaluate possible masses within the retroareolar regions of each breasts. EXAM: DIGITAL DIAGNOSTIC BILATERAL MAMMOGRAM WITH 3D TOMOSYNTHESIS WITH CAD ULTRASOUND BILATERAL BREAST COMPARISON:  Previous screening mammogram dated 06/29/2015. There are no prior studies available. Patient had previous mammograms many years ago but these are no longer available. This is essentially a baseline mammogram. ACR Breast Density Category a: The breast tissue is almost  entirely fatty. FINDINGS: Bilateral CC and MLO views were obtained with tomosynthesis. On these additional views, multiple small circumscribed masses are confirmed within the retroareolar regions of each breast. Mammographic images were processed with CAD. Targeted ultrasound is performed, showing mildly prominent retroareolar ducts bilaterally without internal mass or vascularity. No suspicious solid or cystic masses are identified within either breast (retroareolar regions). IMPRESSION: Multiple small circumscribed masses within the retroareolar regions of each breast. The mildly prominent retroareolar ducts, without internal mass or vascularity, are probable sonographic correlates. As this is essentially patient's baseline mammogram (patient's previous mammograms from many years ago are no longer available), I recommend follow-up bilateral diagnostic mammograms in 6 months to ensure stability. RECOMMENDATION: Follow-up bilateral diagnostic mammograms, with possible ultrasound, in 6 months. I have discussed the findings and recommendations with the patient. Results were also provided in writing at the conclusion of the visit. If applicable, a reminder letter will be sent to the patient regarding the next appointment. BI-RADS CATEGORY  3: Probably benign. Electronically Signed   By: Franki Cabot M.D.   On: 07/09/2015 14:28   Mm Diag Breast Tomo Bilateral  07/09/2015  CLINICAL DATA:  Patient returns today to evaluate possible masses within the  retroareolar regions of each breasts. EXAM: DIGITAL DIAGNOSTIC BILATERAL MAMMOGRAM WITH 3D TOMOSYNTHESIS WITH CAD ULTRASOUND BILATERAL BREAST COMPARISON:  Previous screening mammogram dated 06/29/2015. There are no prior studies available. Patient had previous mammograms many years ago but these are no longer available. This is essentially a baseline mammogram. ACR Breast Density Category a: The breast tissue is almost entirely fatty. FINDINGS: Bilateral CC and MLO views  were obtained with tomosynthesis. On these additional views, multiple small circumscribed masses are confirmed within the retroareolar regions of each breast. Mammographic images were processed with CAD. Targeted ultrasound is performed, showing mildly prominent retroareolar ducts bilaterally without internal mass or vascularity. No suspicious solid or cystic masses are identified within either breast (retroareolar regions). IMPRESSION: Multiple small circumscribed masses within the retroareolar regions of each breast. The mildly prominent retroareolar ducts, without internal mass or vascularity, are probable sonographic correlates. As this is essentially patient's baseline mammogram (patient's previous mammograms from many years ago are no longer available), I recommend follow-up bilateral diagnostic mammograms in 6 months to ensure stability. RECOMMENDATION: Follow-up bilateral diagnostic mammograms, with possible ultrasound, in 6 months. I have discussed the findings and recommendations with the patient. Results were also provided in writing at the conclusion of the visit. If applicable, a reminder letter will be sent to the patient regarding the next appointment. BI-RADS CATEGORY  3: Probably benign. Electronically Signed   By: Franki Cabot M.D.   On: 07/09/2015 14:28    ASSESSMENT/PLAN:  This is a very pleasant 81 years old white female was limited stage small cell lung cancer currently undergoing systemic chemotherapy with carboplatin and etoposide status post 4 cycles concurrent with radiation. She declined prophylactic cranial irradiation. Her recent CT scan of the chest showed no evidence for disease progression. I discussed the scan results with the patient and her husband. I recommended for her to continue on observation with repeat CT scan of the chest in 4 months. She will continue to have Port-A-Cath flush every 8 weeks. For the persistent anemia, I advised the patient to take  over-the-counter iron supplements 1-2 tablets every day. She was advised to call immediately if she has any concerning symptoms in the interval. The patient and her husband agreed to the current plan.  Disclaimer: This note was dictated with voice recognition software. Similar sounding words can inadvertently be transcribed and may not be corrected upon review. Eilleen Kempf., MD 10/01/2014      [facility]  Telephone:(336) [phone] Fax:(336) [phone]  OFFICE PROGRESS NOTE   Humphrey,Elizabeth A, MD [place] [address]  DIAGNOSIS: Small cell lung carcinoma, limited stage   Primary site: Lung (Right)   Staging method: AJCC 7th Edition   Clinical: Stage IIIA (T2a, N2, M0) signed by Curt Bears, MD on 08/06/2013  3:32 PM   Summary: Stage IIIA (T2a, N2, M0)  PRIOR THERAPY: 1) Systemic chemotherapy with carboplatin for an AUC of 5 given on day 1 and etoposide 120 mg per meter squared on days 1, 2 and 3 with Neulasta support given on day 4. This given concurrent with radiation. Status post 4 cycles 2) she declined prophylactic cranial irradiation.  CURRENT THERAPY: Observation.  DISEASE STAGE: Small cell lung carcinoma, limited stage   Primary site: Lung (Right)   Staging method: AJCC 7th Edition   Clinical: Stage IIIA (T2a, N2, M0) signed by Curt Bears, MD on 08/06/2013  3:32 PM   Summary: Stage IIIA (T2a, N2, M0)  CHEMOTHERAPY INTENT: curative  CURRENT #  OF CHEMOTHERAPY CYCLES: 0  CURRENT ANTIEMETICS: Zofran, dexamethasone and Compazine  CURRENT SMOKING STATUS: Former smoker, quit 09/12/1991  ORAL CHEMOTHERAPY AND CONSENT: N/A  CURRENT BISPHOSPHONATES USE: None  PAIN MANAGEMENT: Hydrocodone and acetaminophen, tramadol  NARCOTICS INDUCED CONSTIPATION: Mild  LIVING WILL AND CODE STATUS: ?  INTERVAL HISTORY: Elizabeth Humphrey 81 y.o. female returns for followup visit accompanied by her husband. The patient is feeling fine  today with no significant complaints except for occasional nausea most likely secondary to new medication with Lexapro and also taking a lot of ibuprofen. She denied having any significant chest pain, shortness of breath, cough or hemoptysis. The patient denied having any significant weight loss or night sweats. She denied having any fever or chills. She had repeat CT scan of the chest performed recently and she is here for evaluation and discussion of her scan results.  MEDICAL HISTORY: Past Medical History  Diagnosis Date  . Allergy     bee stings/anaphylaxis  . Hyperlipidemia     hx of  . COPD (chronic obstructive pulmonary disease) 07/30/13  . Heart murmur   . History of pneumonia   . Arthritis   . Pyelonephritis   . Cough   . Lung cancer dx'd 07/2013  . History of radiation therapy 08/26/13-10/20/13    59.4 gray to central chest area    ALLERGIES:  is allergic to neosporin.  MEDICATIONS:  Current Outpatient Prescriptions  Medication Sig Dispense Refill  . acetaminophen (TYLENOL) 325 MG tablet Take 650 mg by mouth every 6 (six) hours as needed (pain).    [name] aspirin EC 81 MG tablet Take 81 mg by mouth at bedtime.    [name] escitalopram (LEXAPRO) 10 MG tablet Take 1 tablet by mouth daily.    [name] ibuprofen (ADVIL,MOTRIN) 200 MG tablet Take 400 mg by mouth every 6 (six) hours as needed for headache or moderate pain.    [name] lidocaine-prilocaine (EMLA) cream Apply to Port-A-Cath one to 2 hours prior to chemotherapy as directed 30 g 1  . rivastigmine (EXELON) 9.5 mg/24hr Place 1 patch onto the skin daily.    . simvastatin (ZOCOR) 40 MG tablet Take 40 mg by mouth at bedtime.     No current facility-administered medications for this visit.    SURGICAL HISTORY:  Past Surgical History  Procedure Laterality Date  . Dilation and curettage of uterus  yrs ago  . Tonsillectomy    . Cataract extraction      both eyes  . Video bronchoscopy with endobronchial ultrasound N/A 08/04/2013    Procedure:  VIDEO BRONCHOSCOPY WITH ENDOBRONCHIAL ULTRASOUND;  Surgeon: Melrose Nakayama, MD;  Location: [facility];  Service: Thoracic;  Laterality: N/A;    REVIEW OF SYSTEMS:  A comprehensive review of systems was negative except for: Gastrointestinal: positive for nausea   PHYSICAL EXAMINATION: General appearance: alert, cooperative, appears stated age and no distress Head: Normocephalic, without obvious abnormality, atraumatic Neck: no adenopathy, no carotid bruit, no JVD, supple, symmetrical, trachea midline and thyroid not enlarged, symmetric, no tenderness/mass/nodules Lymph nodes: Cervical, supraclavicular, and axillary nodes normal. Resp: clear to auscultation bilaterally Back: symmetric, no curvature. ROM normal. No CVA tenderness. Cardio: regular rate and rhythm, S1, S2 normal, no murmur, click, rub or gallop GI: soft, non-tender; bowel sounds normal; no masses,  no organomegaly Extremities: extremities normal, atraumatic, no cyanosis or edema Neurologic: Alert and oriented X 3, normal strength and tone. Normal symmetric reflexes. Normal coordination and gait  ECOG PERFORMANCE STATUS: 1 - Symptomatic but completely  ambulatory  Blood pressure 114/54, pulse 93, temperature 97.6 F (36.4 C), temperature source Oral, resp. rate 18, height '5\' 7"'$  (1.702 m), weight 173 lb 9.6 oz (78.744 kg), SpO2 99 %.  LABORATORY DATA: Lab Results  Component Value Date   WBC 4.9 07/22/2015   HGB 12.6 07/22/2015   HCT 37.4 07/22/2015   MCV 96.8 07/22/2015   PLT 179 07/22/2015      Chemistry      Component Value Date/Time   NA 139 07/22/2015 0908   NA 146 10/06/2013 0625   K 4.7 07/22/2015 0908   K 4.0 10/06/2013 0625   CL 108 10/06/2013 0625   CO2 26 07/22/2015 0908   CO2 23 10/06/2013 0625   BUN 17.9 07/22/2015 0908   BUN 42* 10/06/2013 0625   CREATININE 0.8 07/22/2015 0908   CREATININE 0.92 10/06/2013 0625      Component Value Date/Time   CALCIUM 9.7 07/22/2015 0908   CALCIUM 9.0 10/06/2013  0625   ALKPHOS 66 07/22/2015 0908   ALKPHOS 59 10/06/2013 0625   AST 23 07/22/2015 0908   AST 10 10/06/2013 0625   ALT 21 07/22/2015 0908   ALT 10 10/06/2013 0625   BILITOT 0.44 07/22/2015 0908   BILITOT 0.5 10/06/2013 0625       RADIOGRAPHIC STUDIES: Ct Chest W Contrast  07/22/2015  CLINICAL DATA:  Followup lung cancer. EXAM: CT CHEST WITH CONTRAST TECHNIQUE: Multidetector CT imaging of the chest was performed during intravenous contrast administration. CONTRAST:  84m OMNIPAQUE IOHEXOL 300 MG/ML  SOLN COMPARISON:  01/13/2015 FINDINGS: Mediastinum: The heart size appears within normal limits. No pericardial effusion identified. Aortic atherosclerosis noted. The trachea is patent and appears midline. Unremarkable appearance of the esophagus. No mediastinal or hilar adenopathy identified. Lungs/Pleura: Loculated pleural fluid is identified along the posterior aspect of the right midlung, image number 25 of series 5. This appears similar in volume to the previous exam. Paramediastinal radiation change is again identified. Small nonspecific nodules in the left lung appears stable from previous exam. Upper Abdomen: The adrenal glands both appear normal. On unremarkable appearance of the liver. The visualized portions of the kidneys an urinary bladder appear normal. Musculoskeletal: Spondylosis noted within the thoracic spine. There is a compression fracture within the T10 vertebra. This appears progressive when compared with 01/13/2015. No aggressive lytic or sclerotic bone lesions identified. IMPRESSION: 1. Stable CT of the chest. Right paramediastinal radiation change without specific findings to suggest residual or recurrence of tumor. 2. Stable loculated right pleural effusion. 3. No change in small left-sided pulmonary nodules. 4. Aortic atherosclerosis. 5. There is a age indeterminate compression deformity involving the T10 vertebra. This has progressed from 01/13/2015. Electronically Signed   By:  TKerby MoorsM.D.   On: 07/22/2015 11:35   [place]Breast Ltd Uni Left Inc Axilla  07/09/2015  CLINICAL DATA:  Patient returns today to evaluate possible masses within the retroareolar regions of each breasts. EXAM: DIGITAL DIAGNOSTIC BILATERAL MAMMOGRAM WITH 3D TOMOSYNTHESIS WITH CAD ULTRASOUND BILATERAL BREAST COMPARISON:  Previous screening mammogram dated 06/29/2015. There are no prior studies available. Patient had previous mammograms many years ago but these are no longer available. This is essentially a baseline mammogram. ACR Breast Density Category a: The breast tissue is almost entirely fatty. FINDINGS: Bilateral CC and MLO views were obtained with tomosynthesis. On these additional views, multiple small circumscribed masses are confirmed within the retroareolar regions of each breast. Mammographic images were processed with CAD. Targeted ultrasound is performed, showing mildly prominent  retroareolar ducts bilaterally without internal mass or vascularity. No suspicious solid or cystic masses are identified within either breast (retroareolar regions). IMPRESSION: Multiple small circumscribed masses within the retroareolar regions of each breast. The mildly prominent retroareolar ducts, without internal mass or vascularity, are probable sonographic correlates. As this is essentially patient's baseline mammogram (patient's previous mammograms from many years ago are no longer available), I recommend follow-up bilateral diagnostic mammograms in 6 months to ensure stability. RECOMMENDATION: Follow-up bilateral diagnostic mammograms, with possible ultrasound, in 6 months. I have discussed the findings and recommendations with the patient. Results were also provided in writing at the conclusion of the visit. If applicable, a reminder letter will be sent to the patient regarding the next appointment. BI-RADS CATEGORY  3: Probably benign. Electronically Signed   By: Franki Cabot M.D.   On: 07/09/2015 14:28   US  Breast Ltd Uni Right Inc Axilla  07/09/2015  CLINICAL DATA:  Patient returns today to evaluate possible masses within the retroareolar regions of each breasts. EXAM: DIGITAL DIAGNOSTIC BILATERAL MAMMOGRAM WITH 3D TOMOSYNTHESIS WITH CAD ULTRASOUND BILATERAL BREAST COMPARISON:  Previous screening mammogram dated 06/29/2015. There are no prior studies available. Patient had previous mammograms many years ago but these are no longer available. This is essentially a baseline mammogram. ACR Breast Density Category a: The breast tissue is almost entirely fatty. FINDINGS: Bilateral CC and MLO views were obtained with tomosynthesis. On these additional views, multiple small circumscribed masses are confirmed within the retroareolar regions of each breast. Mammographic images were processed with CAD. Targeted ultrasound is performed, showing mildly prominent retroareolar ducts bilaterally without internal mass or vascularity. No suspicious solid or cystic masses are identified within either breast (retroareolar regions). IMPRESSION: Multiple small circumscribed masses within the retroareolar regions of each breast. The mildly prominent retroareolar ducts, without internal mass or vascularity, are probable sonographic correlates. As this is essentially patient's baseline mammogram (patient's previous mammograms from many years ago are no longer available), I recommend follow-up bilateral diagnostic mammograms in 6 months to ensure stability. RECOMMENDATION: Follow-up bilateral diagnostic mammograms, with possible ultrasound, in 6 months. I have discussed the findings and recommendations with the patient. Results were also provided in writing at the conclusion of the visit. If applicable, a reminder letter will be sent to the patient regarding the next appointment. BI-RADS CATEGORY  3: Probably benign. Electronically Signed   By: Franki Cabot M.D.   On: 07/09/2015 14:28   Mm Diag Breast Tomo Bilateral  07/09/2015   CLINICAL DATA:  Patient returns today to evaluate possible masses within the retroareolar regions of each breasts. EXAM: DIGITAL DIAGNOSTIC BILATERAL MAMMOGRAM WITH 3D TOMOSYNTHESIS WITH CAD ULTRASOUND BILATERAL BREAST COMPARISON:  Previous screening mammogram dated 06/29/2015. There are no prior studies available. Patient had previous mammograms many years ago but these are no longer available. This is essentially a baseline mammogram. ACR Breast Density Category a: The breast tissue is almost entirely fatty. FINDINGS: Bilateral CC and MLO views were obtained with tomosynthesis. On these additional views, multiple small circumscribed masses are confirmed within the retroareolar regions of each breast. Mammographic images were processed with CAD. Targeted ultrasound is performed, showing mildly prominent retroareolar ducts bilaterally without internal mass or vascularity. No suspicious solid or cystic masses are identified within either breast (retroareolar regions). IMPRESSION: Multiple small circumscribed masses within the retroareolar regions of each breast. The mildly prominent retroareolar ducts, without internal mass or vascularity, are probable sonographic correlates. As this is essentially patient's baseline mammogram (patient's previous mammograms from  many years ago are no longer available), I recommend follow-up bilateral diagnostic mammograms in 6 months to ensure stability. RECOMMENDATION: Follow-up bilateral diagnostic mammograms, with possible ultrasound, in 6 months. I have discussed the findings and recommendations with the patient. Results were also provided in writing at the conclusion of the visit. If applicable, a reminder letter will be sent to the patient regarding the next appointment. BI-RADS CATEGORY  3: Probably benign. Electronically Signed   By: Franki Cabot M.D.   On: 07/09/2015 14:28    ASSESSMENT/PLAN:  This is a very pleasant 81 years old white female was limited stage small  cell lung cancer currently undergoing systemic chemotherapy with carboplatin and etoposide status post 4 cycles concurrent with radiation. She declined prophylactic cranial irradiation. Her recent CT scan of the chest showed no evidence for disease progression. The patient is doing fine today except for occasional nausea from her new medications. I recommended for her to discontinue treatment with ibuprofen. I discussed the scan results with the patient and her husband. I recommended for her to continue on observation with repeat CT scan of the chest in 6 months. Her anemia has improved after starting treatment with oral iron tablets. She was advised to call immediately if she has any concerning symptoms in the interval. The patient and her husband agreed to the current plan.  Disclaimer: This note was dictated with voice recognition software. Similar sounding words can inadvertently be transcribed and may not be corrected upon review. Eilleen Kempf., MD

## 2015-08-03 ENCOUNTER — Ambulatory Visit
Admission: RE | Admit: 2015-08-03 | Discharge: 2015-08-03 | Disposition: A | Discharge status: Home / Self Care | Payer: Medicare Other | Source: Ambulatory Visit | Attending: Internal Medicine | Admitting: Internal Medicine

## 2015-08-03 DIAGNOSIS — M4806 Spinal stenosis, lumbar region: Secondary | ICD-10-CM | POA: Diagnosis not present

## 2015-08-03 DIAGNOSIS — M5416 Radiculopathy, lumbar region: Secondary | ICD-10-CM

## 2015-08-03 DIAGNOSIS — M5126 Other intervertebral disc displacement, lumbar region: Secondary | ICD-10-CM | POA: Diagnosis not present

## 2015-08-12 DIAGNOSIS — M4806 Spinal stenosis, lumbar region: Secondary | ICD-10-CM | POA: Diagnosis not present

## 2015-08-12 DIAGNOSIS — Z6827 Body mass index (BMI) 27.0-27.9, adult: Secondary | ICD-10-CM | POA: Diagnosis not present

## 2015-08-12 DIAGNOSIS — M4727 Other spondylosis with radiculopathy, lumbosacral region: Secondary | ICD-10-CM | POA: Diagnosis not present

## 2015-08-12 DIAGNOSIS — M5416 Radiculopathy, lumbar region: Secondary | ICD-10-CM | POA: Diagnosis not present

## 2015-08-17 DIAGNOSIS — M4727 Other spondylosis with radiculopathy, lumbosacral region: Secondary | ICD-10-CM | POA: Diagnosis not present

## 2015-08-24 DIAGNOSIS — M4727 Other spondylosis with radiculopathy, lumbosacral region: Secondary | ICD-10-CM | POA: Diagnosis not present

## 2015-08-26 DIAGNOSIS — M4727 Other spondylosis with radiculopathy, lumbosacral region: Secondary | ICD-10-CM | POA: Diagnosis not present

## 2015-08-30 DIAGNOSIS — M4727 Other spondylosis with radiculopathy, lumbosacral region: Secondary | ICD-10-CM | POA: Diagnosis not present

## 2015-09-01 DIAGNOSIS — M4727 Other spondylosis with radiculopathy, lumbosacral region: Secondary | ICD-10-CM | POA: Diagnosis not present

## 2015-09-08 DIAGNOSIS — M4727 Other spondylosis with radiculopathy, lumbosacral region: Secondary | ICD-10-CM | POA: Diagnosis not present

## 2015-09-10 DIAGNOSIS — M4727 Other spondylosis with radiculopathy, lumbosacral region: Secondary | ICD-10-CM | POA: Diagnosis not present

## 2015-09-20 DIAGNOSIS — M4727 Other spondylosis with radiculopathy, lumbosacral region: Secondary | ICD-10-CM | POA: Diagnosis not present

## 2015-09-23 DIAGNOSIS — M4727 Other spondylosis with radiculopathy, lumbosacral region: Secondary | ICD-10-CM | POA: Diagnosis not present

## 2015-10-01 DIAGNOSIS — M4727 Other spondylosis with radiculopathy, lumbosacral region: Secondary | ICD-10-CM | POA: Diagnosis not present

## 2015-11-02 DIAGNOSIS — Z6827 Body mass index (BMI) 27.0-27.9, adult: Secondary | ICD-10-CM | POA: Diagnosis not present

## 2015-11-02 DIAGNOSIS — J209 Acute bronchitis, unspecified: Secondary | ICD-10-CM | POA: Diagnosis not present

## 2015-11-02 DIAGNOSIS — C3490 Malignant neoplasm of unspecified part of unspecified bronchus or lung: Secondary | ICD-10-CM | POA: Diagnosis not present

## 2015-11-02 DIAGNOSIS — R0602 Shortness of breath: Secondary | ICD-10-CM | POA: Diagnosis not present

## 2016-01-24 ENCOUNTER — Telehealth: Payer: Self-pay | Admitting: Internal Medicine

## 2016-01-24 NOTE — Telephone Encounter (Signed)
Returned call and lvm confirm appt

## 2016-01-26 ENCOUNTER — Other Ambulatory Visit (HOSPITAL_BASED_OUTPATIENT_CLINIC_OR_DEPARTMENT_OTHER): Payer: Medicare Other

## 2016-01-26 ENCOUNTER — Ambulatory Visit (HOSPITAL_COMMUNITY)
Admission: RE | Admit: 2016-01-26 | Discharge: 2016-01-26 | Disposition: A | Discharge status: Home / Self Care | Payer: Medicare Other | Source: Ambulatory Visit | Attending: Internal Medicine | Admitting: Internal Medicine

## 2016-01-26 DIAGNOSIS — I2510 Atherosclerotic heart disease of native coronary artery without angina pectoris: Secondary | ICD-10-CM | POA: Insufficient documentation

## 2016-01-26 DIAGNOSIS — Z923 Personal history of irradiation: Secondary | ICD-10-CM | POA: Diagnosis not present

## 2016-01-26 DIAGNOSIS — I7090 Unspecified atherosclerosis: Secondary | ICD-10-CM | POA: Insufficient documentation

## 2016-01-26 DIAGNOSIS — R918 Other nonspecific abnormal finding of lung field: Secondary | ICD-10-CM | POA: Insufficient documentation

## 2016-01-26 DIAGNOSIS — J90 Pleural effusion, not elsewhere classified: Secondary | ICD-10-CM | POA: Insufficient documentation

## 2016-01-26 DIAGNOSIS — C3491 Malignant neoplasm of unspecified part of right bronchus or lung: Secondary | ICD-10-CM

## 2016-01-26 DIAGNOSIS — C342 Malignant neoplasm of middle lobe, bronchus or lung: Secondary | ICD-10-CM

## 2016-01-26 DIAGNOSIS — J701 Chronic and other pulmonary manifestations due to radiation: Secondary | ICD-10-CM | POA: Diagnosis not present

## 2016-01-26 LAB — COMPREHENSIVE METABOLIC PANEL
ALK PHOS: 56 U/L (ref 40–150)
ALT: 15 U/L (ref 0–55)
AST: 15 U/L (ref 5–34)
Albumin: 3.7 g/dL (ref 3.5–5.0)
Anion Gap: 6 mEq/L (ref 3–11)
BUN: 15.8 mg/dL (ref 7.0–26.0)
CO2: 28 meq/L (ref 22–29)
Calcium: 9.2 mg/dL (ref 8.4–10.4)
Chloride: 108 mEq/L (ref 98–109)
Creatinine: 0.8 mg/dL (ref 0.6–1.1)
EGFR: 68 mL/min/{1.73_m2} — AB (ref >90)
GLUCOSE: 94 mg/dL (ref 70–140)
Potassium: 4.1 mEq/L (ref 3.5–5.1)
SODIUM: 142 meq/L (ref 136–145)
Total Bilirubin: 0.38 mg/dL (ref 0.20–1.20)
Total Protein: 7.1 g/dL (ref 6.4–8.3)

## 2016-01-26 LAB — CBC WITH DIFFERENTIAL/PLATELET
BASO%: 0.7 % (ref 0.0–2.0)
Basophils Absolute: 0 10*3/uL (ref 0.0–0.1)
EOS%: 2 % (ref 0.0–7.0)
Eosinophils Absolute: 0.1 10*3/uL (ref 0.0–0.5)
HCT: 35 % (ref 34.8–46.6)
HGB: 11.6 g/dL (ref 11.6–15.9)
LYMPH%: 13.1 % — ABNORMAL LOW (ref 14.0–49.7)
MCH: 32 pg (ref 25.1–34.0)
MCHC: 33.1 g/dL (ref 31.5–36.0)
MCV: 96.7 fL (ref 79.5–101.0)
MONO#: 0.7 10*3/uL (ref 0.1–0.9)
MONO%: 14.2 % — AB (ref 0.0–14.0)
NEUT#: 3.6 10*3/uL (ref 1.5–6.5)
NEUT%: 70 % (ref 38.4–76.8)
PLATELETS: 166 10*3/uL (ref 145–400)
RBC: 3.61 10*6/uL — AB (ref 3.70–5.45)
RDW: 16.5 % — ABNORMAL HIGH (ref 11.2–14.5)
WBC: 5.1 10*3/uL (ref 3.9–10.3)
lymph#: 0.7 10*3/uL — ABNORMAL LOW (ref 0.9–3.3)

## 2016-01-26 MED ORDER — IOPAMIDOL (ISOVUE-300) INJECTION 61%
75.0000 mL | Freq: Once | INTRAVENOUS | Status: AC | PRN
  Administered 2016-01-26: 75 mL via INTRAVENOUS

## 2016-01-31 ENCOUNTER — Ambulatory Visit (HOSPITAL_BASED_OUTPATIENT_CLINIC_OR_DEPARTMENT_OTHER): Payer: Medicare Other | Admitting: Internal Medicine

## 2016-01-31 ENCOUNTER — Encounter: Payer: Self-pay | Admitting: Internal Medicine

## 2016-01-31 ENCOUNTER — Telehealth: Payer: Self-pay | Admitting: Internal Medicine

## 2016-01-31 ENCOUNTER — Encounter: Payer: Self-pay | Admitting: *Deleted

## 2016-01-31 VITALS — BP 106/61 | HR 103 | Temp 98.6°F | Resp 18 | Ht 67.0 in | Wt 178.3 lb

## 2016-01-31 DIAGNOSIS — C3491 Malignant neoplasm of unspecified part of right bronchus or lung: Secondary | ICD-10-CM | POA: Diagnosis not present

## 2016-01-31 NOTE — Progress Notes (Signed)
Oncology Nurse Navigator Documentation  Oncology Nurse Navigator Flowsheets 01/31/2016  Navigator Encounter Type Clinic/MDC  Patient Visit Type MedOnc  Treatment Phase Post-Tx Follow-up  Barriers/Navigation Needs No barriers at this time  Interventions None required  Acuity Level 1  Acuity Level 1 Minimal follow up required  Time Spent with Patient 25   Spoke with patient and her husband today.  She is doing well without complaints.

## 2016-01-31 NOTE — Telephone Encounter (Signed)
Gave and printed appt sched and avs fo rpt; for NOV  °

## 2016-01-31 NOTE — Progress Notes (Signed)
[facility]  [phone]) [phone] Fax:(336) [phone]  OFFICE PROGRESS NOTE   ARONSON,RICHARD A, MD [place] [address]  DIAGNOSIS: Small cell lung carcinoma, limited stage   Primary site: Lung (Right)   Staging method: AJCC 7th Edition   Clinical: Stage IIIA (T2a, N2, M0) signed by Curt Bears, MD on 08/06/2013  3:32 PM   Summary: Stage IIIA (T2a, N2, M0)  PRIOR THERAPY: 1) Systemic chemotherapy with carboplatin for an AUC of 5 given on day 1 and etoposide 120 mg per meter squared on days 1, 2 and 3 with Neulasta support given on day 4. This given concurrent with radiation. Status post 4 cycles 2) she declined prophylactic cranial irradiation.  CURRENT THERAPY: Observation.  DISEASE STAGE: Small cell lung carcinoma, limited stage   Primary site: Lung (Right)   Staging method: AJCC 7th Edition   Clinical: Stage IIIA (T2a, N2, M0) signed by Curt Bears, MD on 08/06/2013  3:32 PM   Summary: Stage IIIA (T2a, N2, M0)  CHEMOTHERAPY INTENT: curative  CURRENT # OF CHEMOTHERAPY CYCLES: 4  CURRENT ANTIEMETICS: Zofran, dexamethasone and Compazine  CURRENT SMOKING STATUS: Former smoker, quit 09/12/1991  ORAL CHEMOTHERAPY AND CONSENT: N/A  CURRENT BISPHOSPHONATES USE: None  PAIN MANAGEMENT: Hydrocodone and acetaminophen, tramadol  NARCOTICS INDUCED CONSTIPATION: Mild  LIVING WILL AND CODE STATUS: ?  INTERVAL HISTORY: Elizabeth Humphrey 82 y.o. female returns for followup visit accompanied by her husband. The patient is feeling fine today with no significant complaints. She has been on observation for more than 2 years. She denied having any significant chest pain, shortness of breath with exertion, cough or hemoptysis. The patient denied having any significant weight loss or night sweats. She denied having any nausea or vomiting, no fever or chills. She had repeat CT scan of the chest performed recently and she is here for evaluation and  discussion of her scan results.  MEDICAL HISTORY: Past Medical History  Diagnosis Date  . Allergy     bee stings/anaphylaxis  . Hyperlipidemia     hx of  . COPD (chronic obstructive pulmonary disease) ([place]) 07/30/13  . Heart murmur   . History of pneumonia   . Arthritis   . Pyelonephritis   . Cough   . Lung cancer ([place]) dx'd 07/2013  . History of radiation therapy 08/26/13-10/20/13    59.4 gray to central chest area    ALLERGIES:  is allergic to neosporin.  MEDICATIONS:  Current Outpatient Prescriptions  Medication Sig Dispense Refill  . acetaminophen (TYLENOL) 325 MG tablet Take 650 mg by mouth every 6 (six) hours as needed (pain).    [name] aspirin EC 81 MG tablet Take 81 mg by mouth at bedtime.    . donepezil (ARICEPT) 10 MG tablet     . simvastatin (ZOCOR) 40 MG tablet Take 40 mg by mouth daily.    [name] escitalopram (LEXAPRO) 10 MG tablet Take 1 tablet by mouth daily. Reported on 01/31/2016    . ibuprofen (ADVIL,MOTRIN) 200 MG tablet Take 400 mg by mouth every 6 (six) hours as needed for headache or moderate pain. Reported on 01/31/2016     No current facility-administered medications for this visit.    SURGICAL HISTORY:  Past Surgical History  Procedure Laterality Date  . Dilation and curettage of uterus  yrs ago  . Tonsillectomy    . Cataract extraction      both eyes  . Video bronchoscopy with endobronchial ultrasound N/A 08/04/2013    Procedure: VIDEO  BRONCHOSCOPY WITH ENDOBRONCHIAL ULTRASOUND;  Surgeon: Melrose Nakayama, MD;  Location: [facility] OR;  Service: Thoracic;  Laterality: N/A;    REVIEW OF SYSTEMS:  A comprehensive review of systems was negative.   PHYSICAL EXAMINATION: General appearance: alert, cooperative, appears stated age and no distress Head: Normocephalic, without obvious abnormality, atraumatic Neck: no adenopathy, no carotid bruit, no JVD, supple, symmetrical, trachea midline and thyroid not enlarged, symmetric, no tenderness/mass/nodules Lymph nodes:  Cervical, supraclavicular, and axillary nodes normal. Resp: clear to auscultation bilaterally Back: symmetric, no curvature. ROM normal. No CVA tenderness. Cardio: regular rate and rhythm, S1, S2 normal, no murmur, click, rub or gallop GI: soft, non-tender; bowel sounds normal; no masses,  no organomegaly Extremities: extremities normal, atraumatic, no cyanosis or edema Neurologic: Alert and oriented X 3, normal strength and tone. Normal symmetric reflexes. Normal coordination and gait  ECOG PERFORMANCE STATUS: 1 - Symptomatic but completely ambulatory  Blood pressure 106/61, pulse 103, temperature 98.6 F (37 C), temperature source Oral, resp. rate 18, height '5\' 7"'$  (1.702 m), weight 178 lb 4.8 oz (80.876 kg), SpO2 99 %.  LABORATORY DATA: Lab Results  Component Value Date   WBC 5.1 01/26/2016   HGB 11.6 01/26/2016   HCT 35.0 01/26/2016   MCV 96.7 01/26/2016   PLT 166 01/26/2016      Chemistry      Component Value Date/Time   NA 142 01/26/2016 0903   NA 146 10/06/2013 0625   K 4.1 01/26/2016 0903   K 4.0 10/06/2013 0625   CL 108 10/06/2013 0625   CO2 28 01/26/2016 0903   CO2 23 10/06/2013 0625   BUN 15.8 01/26/2016 0903   BUN 42* 10/06/2013 0625   CREATININE 0.8 01/26/2016 0903   CREATININE 0.92 10/06/2013 0625      Component Value Date/Time   CALCIUM 9.2 01/26/2016 0903   CALCIUM 9.0 10/06/2013 0625   ALKPHOS 56 01/26/2016 0903   ALKPHOS 59 10/06/2013 0625   AST 15 01/26/2016 0903   AST 10 10/06/2013 0625   ALT 15 01/26/2016 0903   ALT 10 10/06/2013 0625   BILITOT 0.38 01/26/2016 0903   BILITOT 0.5 10/06/2013 0625       RADIOGRAPHIC STUDIES: Ct Chest W Contrast  01/26/2016  CLINICAL DATA:  Followup of lung cancer. Squamous cell carcinoma of the right lung. Restaging. EXAM: CT CHEST WITH CONTRAST TECHNIQUE: Multidetector CT imaging of the chest was performed during intravenous contrast administration. CONTRAST:  85m ISOVUE-300 IOPAMIDOL (ISOVUE-300) INJECTION  61% COMPARISON:  07/22/2015 FINDINGS: Mediastinum/Nodes: No supraclavicular adenopathy. Aortic and branch vessel atherosclerosis. Tortuous thoracic aorta. Normal heart size. Multivessel coronary artery atherosclerosis. Trace pericardial fluid is unchanged and favored to be physiologic. No central pulmonary embolism, on this non-dedicated study. No mediastinal or hilar adenopathy. Lungs/Pleura: Small right-sided pleural effusion is similar. Patent airways. Right apical pleural-parenchymal scarring. Mild centrilobular emphysema. Minimal motion degradation inferiorly. 5 mm left lower lobe pulmonary nodule on image 78/series 5. Similar, given differences in slice thickness. Just inferiorly, a 4 mm nodule on image 82/series 5 is also not significantly changed. Smaller left lower lobe nodules including at 2 mm on image 74/series 5 and just over the left hemidiaphragm at 3 mm on image 104/series 5. Likely similar. Right paramediastinal radiation fibrosis again identified. Similar in distribution, without residual or locally recurrent disease. Upper abdomen: Normal imaged portions of the liver, spleen, stomach, pancreas, gallbladder, adrenal glands, kidneys. Abdominal aortic atherosclerosis. Musculoskeletal: Second right rib deformity could be postsurgical or posttraumatic. Compression deformity involving the  T10 vertebral body mild-to-moderate and similar. IMPRESSION: 1. Similar appearance of right-sided paramediastinal radiation fibrosis, without recurrent or metastatic disease. 2. Similar small right-sided pleural effusion. 3. Similar left-sided pulmonary nodules. 4.  Atherosclerosis, including within the coronary arteries. Electronically Signed   By: Abigail Miyamoto M.D.   On: 01/26/2016 13:59    ASSESSMENT/PLAN:  This is a very pleasant 82 years old white female was limited stage small cell lung cancer currently undergoing systemic chemotherapy with carboplatin and etoposide status post 4 cycles concurrent with  radiation. She declined prophylactic cranial irradiation. Her recent CT scan of the chest showed no evidence for disease progression. I discussed the scan results with the patient and her husband. I recommended for her to continue on observation with repeat CT scan of the chest in 6 months. She was advised to call immediately if she has any concerning symptoms in the interval. The patient and her husband agreed to the current plan.  Disclaimer: This note was dictated with voice recognition software. Similar sounding words can inadvertently be transcribed and may not be corrected upon review.

## 2016-03-09 ENCOUNTER — Other Ambulatory Visit: Payer: Self-pay

## 2016-03-09 DIAGNOSIS — C44529 Squamous cell carcinoma of skin of other part of trunk: Secondary | ICD-10-CM | POA: Diagnosis not present

## 2016-03-30 DIAGNOSIS — C44529 Squamous cell carcinoma of skin of other part of trunk: Secondary | ICD-10-CM | POA: Diagnosis not present

## 2016-03-30 DIAGNOSIS — Z85828 Personal history of other malignant neoplasm of skin: Secondary | ICD-10-CM | POA: Diagnosis not present

## 2016-07-06 DIAGNOSIS — Z23 Encounter for immunization: Secondary | ICD-10-CM | POA: Diagnosis not present

## 2016-07-06 DIAGNOSIS — R05 Cough: Secondary | ICD-10-CM | POA: Diagnosis not present

## 2016-07-06 DIAGNOSIS — Z6828 Body mass index (BMI) 28.0-28.9, adult: Secondary | ICD-10-CM | POA: Diagnosis not present

## 2016-07-06 DIAGNOSIS — J302 Other seasonal allergic rhinitis: Secondary | ICD-10-CM | POA: Diagnosis not present

## 2016-07-24 ENCOUNTER — Ambulatory Visit (HOSPITAL_COMMUNITY)
Admission: RE | Admit: 2016-07-24 | Discharge: 2016-07-24 | Disposition: A | Discharge status: Home / Self Care | Payer: Medicare Other | Source: Ambulatory Visit | Attending: Internal Medicine | Admitting: Internal Medicine

## 2016-07-24 ENCOUNTER — Other Ambulatory Visit (HOSPITAL_BASED_OUTPATIENT_CLINIC_OR_DEPARTMENT_OTHER): Payer: Medicare Other

## 2016-07-24 DIAGNOSIS — I700 Atherosclerosis of aorta: Secondary | ICD-10-CM | POA: Diagnosis not present

## 2016-07-24 DIAGNOSIS — R918 Other nonspecific abnormal finding of lung field: Secondary | ICD-10-CM | POA: Diagnosis not present

## 2016-07-24 DIAGNOSIS — C3491 Malignant neoplasm of unspecified part of right bronchus or lung: Secondary | ICD-10-CM | POA: Insufficient documentation

## 2016-07-24 DIAGNOSIS — C3490 Malignant neoplasm of unspecified part of unspecified bronchus or lung: Secondary | ICD-10-CM | POA: Diagnosis not present

## 2016-07-24 DIAGNOSIS — J90 Pleural effusion, not elsewhere classified: Secondary | ICD-10-CM | POA: Diagnosis not present

## 2016-07-24 LAB — CBC WITH DIFFERENTIAL/PLATELET
BASO%: 0.2 % (ref 0.0–2.0)
Basophils Absolute: 0 10*3/uL (ref 0.0–0.1)
EOS ABS: 0.1 10*3/uL (ref 0.0–0.5)
EOS%: 1.2 % (ref 0.0–7.0)
HCT: 36.1 % (ref 34.8–46.6)
HEMOGLOBIN: 11.6 g/dL (ref 11.6–15.9)
LYMPH#: 0.7 10*3/uL — AB (ref 0.9–3.3)
LYMPH%: 11.1 % — ABNORMAL LOW (ref 14.0–49.7)
MCH: 31.4 pg (ref 25.1–34.0)
MCHC: 32.1 g/dL (ref 31.5–36.0)
MCV: 97.6 fL (ref 79.5–101.0)
MONO#: 0.7 10*3/uL (ref 0.1–0.9)
MONO%: 12.2 % (ref 0.0–14.0)
NEUT%: 75.3 % (ref 38.4–76.8)
NEUTROS ABS: 4.4 10*3/uL (ref 1.5–6.5)
PLATELETS: 166 10*3/uL (ref 145–400)
RBC: 3.7 10*6/uL (ref 3.70–5.45)
RDW: 16.1 % — AB (ref 11.2–14.5)
WBC: 5.8 10*3/uL (ref 3.9–10.3)

## 2016-07-24 LAB — COMPREHENSIVE METABOLIC PANEL
ALBUMIN: 3.6 g/dL (ref 3.5–5.0)
ALK PHOS: 66 U/L (ref 40–150)
ALT: 15 U/L (ref 0–55)
ANION GAP: 10 meq/L (ref 3–11)
AST: 16 U/L (ref 5–34)
BILIRUBIN TOTAL: 0.41 mg/dL (ref 0.20–1.20)
BUN: 13.9 mg/dL (ref 7.0–26.0)
CO2: 26 mEq/L (ref 22–29)
CREATININE: 0.8 mg/dL (ref 0.6–1.1)
Calcium: 9.8 mg/dL (ref 8.4–10.4)
Chloride: 106 mEq/L (ref 98–109)
EGFR: 74 mL/min/{1.73_m2} — AB (ref >90)
GLUCOSE: 115 mg/dL (ref 70–140)
Potassium: 4.6 mEq/L (ref 3.5–5.1)
SODIUM: 142 meq/L (ref 136–145)
TOTAL PROTEIN: 7.1 g/dL (ref 6.4–8.3)

## 2016-07-24 MED ORDER — IOPAMIDOL (ISOVUE-300) INJECTION 61%
75.0000 mL | Freq: Once | INTRAVENOUS | Status: AC | PRN
  Administered 2016-07-24: 75 mL via INTRAVENOUS

## 2016-07-31 ENCOUNTER — Telehealth: Payer: Self-pay | Admitting: Internal Medicine

## 2016-07-31 ENCOUNTER — Encounter: Payer: Self-pay | Admitting: Internal Medicine

## 2016-07-31 ENCOUNTER — Ambulatory Visit (HOSPITAL_BASED_OUTPATIENT_CLINIC_OR_DEPARTMENT_OTHER): Payer: Medicare Other | Admitting: Internal Medicine

## 2016-07-31 VITALS — BP 121/56 | HR 101 | Temp 97.7°F | Resp 17 | Ht 67.0 in | Wt 180.1 lb

## 2016-07-31 DIAGNOSIS — C3491 Malignant neoplasm of unspecified part of right bronchus or lung: Secondary | ICD-10-CM

## 2016-07-31 DIAGNOSIS — C3490 Malignant neoplasm of unspecified part of unspecified bronchus or lung: Secondary | ICD-10-CM | POA: Diagnosis not present

## 2016-07-31 NOTE — Telephone Encounter (Signed)
Appointments scheduled per 07/31/16 los. A copy of the AVS report & appointment schedule was given to patient,per 07/31/16 los.

## 2016-07-31 NOTE — Progress Notes (Signed)
[facility]  Telephone:(336) [phone] Fax:(336) [phone]  OFFICE PROGRESS NOTE   ARONSON,RICHARD A, MD [place] [address]  DIAGNOSIS: Small cell lung carcinoma, limited stage   Primary site: Lung (Right)   Staging method: AJCC 7th Edition   Clinical: Stage IIIA (T2a, N2, M0) signed by Curt Bears, MD on 08/06/2013  3:32 PM   Summary: Stage IIIA (T2a, N2, M0)  PRIOR THERAPY: 1) Systemic chemotherapy with carboplatin for an AUC of 5 given on day 1 and etoposide 120 mg per meter squared on days 1, 2 and 3 with Neulasta support given on day 4. This given concurrent with radiation. Status post 4 cycles 2) she declined prophylactic cranial irradiation.  CURRENT THERAPY: Observation.  DISEASE STAGE: Small cell lung carcinoma, limited stage   Primary site: Lung (Right)   Staging method: AJCC 7th Edition   Clinical: Stage IIIA (T2a, N2, M0) signed by Curt Bears, MD on 08/06/2013  3:32 PM   Summary: Stage IIIA (T2a, N2, M0)  CHEMOTHERAPY INTENT: curative  CURRENT # OF CHEMOTHERAPY CYCLES: 4  CURRENT ANTIEMETICS: Zofran, dexamethasone and Compazine  CURRENT SMOKING STATUS: Former smoker, quit 09/12/1991  ORAL CHEMOTHERAPY AND CONSENT: N/A  CURRENT BISPHOSPHONATES USE: None  PAIN MANAGEMENT: Hydrocodone and acetaminophen, tramadol  NARCOTICS INDUCED CONSTIPATION: Mild  LIVING WILL AND CODE STATUS: ?  INTERVAL HISTORY: Elizabeth Humphrey 82 y.o. female returns for followup visit accompanied by her husband. The patient is feeling fine today with no significant complaints. She is currently on observation and feeling very well. She has intermittent dry cough. She was seen recently by her primary care physician and was advised to use over-the-counter cough medication. She denied having any significant chest pain, shortness of breath with exertion or hemoptysis. The patient denied having any significant weight loss or night sweats. She denied  having any nausea or vomiting, no fever or chills. She had repeat CT scan of the chest performed recently and she is here for evaluation and discussion of her scan results.  MEDICAL HISTORY: Past Medical History:  Diagnosis Date  . Allergy    bee stings/anaphylaxis  . Arthritis   . COPD (chronic obstructive pulmonary disease) ([place]) 07/30/13  . Cough   . Heart murmur   . History of pneumonia   . History of radiation therapy 08/26/13-10/20/13   59.4 gray to central chest area  . Hyperlipidemia    hx of  . Lung cancer ([place]) dx'd 07/2013  . Pyelonephritis     ALLERGIES:  is allergic to neosporin [neomycin-bacitracin zn-polymyx].  MEDICATIONS:  Current Outpatient Prescriptions  Medication Sig Dispense Refill  . acetaminophen (TYLENOL) 325 MG tablet Take 650 mg by mouth every 6 (six) hours as needed (pain).    [name] aspirin EC 81 MG tablet Take 81 mg by mouth at bedtime.    . donepezil (ARICEPT) 10 MG tablet     . escitalopram (LEXAPRO) 10 MG tablet Take 1 tablet by mouth daily. Reported on 01/31/2016    . ibuprofen (ADVIL,MOTRIN) 200 MG tablet Take 400 mg by mouth every 6 (six) hours as needed for headache or moderate pain. Reported on 01/31/2016    . simvastatin (ZOCOR) 40 MG tablet Take 40 mg by mouth daily.     No current facility-administered medications for this visit.     SURGICAL HISTORY:  Past Surgical History:  Procedure Laterality Date  . CATARACT EXTRACTION     both eyes  . DILATION AND CURETTAGE OF UTERUS  yrs ago  .  TONSILLECTOMY    . VIDEO BRONCHOSCOPY WITH ENDOBRONCHIAL ULTRASOUND N/A 08/04/2013   Procedure: VIDEO BRONCHOSCOPY WITH ENDOBRONCHIAL ULTRASOUND;  Surgeon: Melrose Nakayama, MD;  Location: [place];  Service: Thoracic;  Laterality: N/A;    REVIEW OF SYSTEMS:  A comprehensive review of systems was negative except for: Respiratory: positive for cough   PHYSICAL EXAMINATION: General appearance: alert, cooperative, appears stated age and no distress Head:  Normocephalic, without obvious abnormality, atraumatic Neck: no adenopathy, no carotid bruit, no JVD, supple, symmetrical, trachea midline and thyroid not enlarged, symmetric, no tenderness/mass/nodules Lymph nodes: Cervical, supraclavicular, and axillary nodes normal. Resp: clear to auscultation bilaterally Back: symmetric, no curvature. ROM normal. No CVA tenderness. Cardio: regular rate and rhythm, S1, S2 normal, no murmur, click, rub or gallop GI: soft, non-tender; bowel sounds normal; no masses,  no organomegaly Extremities: extremities normal, atraumatic, no cyanosis or edema Neurologic: Alert and oriented X 3, normal strength and tone. Normal symmetric reflexes. Normal coordination and gait  ECOG PERFORMANCE STATUS: 1 - Symptomatic but completely ambulatory  Blood pressure (!) 121/56, pulse (!) 101, temperature 97.7 F (36.5 C), temperature source Oral, resp. rate 17, height '5\' 7"'$  (1.702 m), weight 180 lb 1.6 oz (81.7 kg), SpO2 98 %.  LABORATORY DATA: Lab Results  Component Value Date   WBC 5.8 07/24/2016   HGB 11.6 07/24/2016   HCT 36.1 07/24/2016   MCV 97.6 07/24/2016   PLT 166 07/24/2016      Chemistry      Component Value Date/Time   NA 142 07/24/2016 0926   K 4.6 07/24/2016 0926   CL 108 10/06/2013 0625   CO2 26 07/24/2016 0926   BUN 13.9 07/24/2016 0926   CREATININE 0.8 07/24/2016 0926      Component Value Date/Time   CALCIUM 9.8 07/24/2016 0926   ALKPHOS 66 07/24/2016 0926   AST 16 07/24/2016 0926   ALT 15 07/24/2016 0926   BILITOT 0.41 07/24/2016 0926       RADIOGRAPHIC STUDIES: Ct Chest W Contrast  Result Date: 07/24/2016 CLINICAL DATA:  Lung cancer restaging. EXAM: CT CHEST WITH CONTRAST TECHNIQUE: Multidetector CT imaging of the chest was performed during intravenous contrast administration. CONTRAST:  67m ISOVUE-300 IOPAMIDOL (ISOVUE-300) INJECTION 61% COMPARISON:  01/26/2016 FINDINGS: Cardiovascular: The heart size is normal. Heart size is normal.  Stable appearance trace pericardial effusion. Coronary artery calcification is noted. Atherosclerotic calcification is noted in the wall of the thoracic aorta. Mediastinum/Nodes: No mediastinal lymphadenopathy. Amorphous soft tissue attenuation in the subcarinal space and posterior right hilum is stable, and likely reflects post treatment change. No discrete hilar lymphadenopathy. The esophagus has normal imaging features. Lungs/Pleura: Given the slightly different slice orientation between the 2 studies, there has been no appreciable interval change in the right parahilar and right apical pleural-parenchymal fibrosis/scarring. Small right pleural effusion is stable. 5 mm lingular nodule seen on image 69 series 6 is stable. 5 mm left lower lobe pulmonary nodule (image 82 series 6) is stable and a second left lower lobe nodule seen just inferior measures 4 mm today compared to 4 mm previously (image 86 series 6 today). Tiny nodules seen previously along the posterior left hemidiaphragm (image 106) is stable. No new or progressive pulmonary nodule or mass. No focal airspace consolidation. No evidence for pulmonary edema. Upper Abdomen: 3 mm hypodensity anterior right liver (122) likely benign, but attention on follow-up recommended. Atherosclerotic calcification noted in the upper abdominal aorta. Musculoskeletal: Stable appearance T10 compression deformity. Bone windows reveal no worrisome lytic  or sclerotic osseous lesions. IMPRESSION: 1. Stable exam. Post radiation changes/scarring in the right lung are stable in the interval. 2. Stable small right pleural effusion. 3. Scattered tiny left pulmonary nodules unchanged interval. 4. Coronary artery and thoracoabdominal aortic atherosclerosis. Electronically Signed   By: Misty Stanley M.D.   On: 07/24/2016 11:49    ASSESSMENT/PLAN:  This is a very pleasant 82 years old white female was limited stage small cell lung cancer currently undergoing systemic chemotherapy  with carboplatin and etoposide status post 4 cycles concurrent with radiation. She declined prophylactic cranial irradiation. Her recent CT scan of the chest showed no evidence for disease progression. I discussed the scan results with the patient and her husband. For the dry cough I advised her to take over-the-counter Delsym syrup as needed. I recommended for her to continue on observation with repeat CT scan of the chest in 6 months. She was advised to call immediately if she has any concerning symptoms in the interval. The patient and her husband agreed to the current plan.  Disclaimer: This note was dictated with voice recognition software. Similar sounding words can inadvertently be transcribed and may not be corrected upon review.

## 2016-12-07 DIAGNOSIS — R7301 Impaired fasting glucose: Secondary | ICD-10-CM | POA: Diagnosis not present

## 2016-12-07 DIAGNOSIS — E784 Other hyperlipidemia: Secondary | ICD-10-CM | POA: Diagnosis not present

## 2016-12-13 DIAGNOSIS — Z6829 Body mass index (BMI) 29.0-29.9, adult: Secondary | ICD-10-CM | POA: Diagnosis not present

## 2016-12-13 DIAGNOSIS — C3490 Malignant neoplasm of unspecified part of unspecified bronchus or lung: Secondary | ICD-10-CM | POA: Diagnosis not present

## 2016-12-13 DIAGNOSIS — Z0000 Encounter for general adult medical examination without abnormal findings: Secondary | ICD-10-CM | POA: Diagnosis not present

## 2016-12-13 DIAGNOSIS — Z23 Encounter for immunization: Secondary | ICD-10-CM | POA: Diagnosis not present

## 2016-12-13 DIAGNOSIS — Z1389 Encounter for screening for other disorder: Secondary | ICD-10-CM | POA: Diagnosis not present

## 2016-12-13 DIAGNOSIS — R011 Cardiac murmur, unspecified: Secondary | ICD-10-CM | POA: Diagnosis not present

## 2016-12-13 DIAGNOSIS — R7301 Impaired fasting glucose: Secondary | ICD-10-CM | POA: Diagnosis not present

## 2016-12-13 DIAGNOSIS — E784 Other hyperlipidemia: Secondary | ICD-10-CM | POA: Diagnosis not present

## 2016-12-13 DIAGNOSIS — H8113 Benign paroxysmal vertigo, bilateral: Secondary | ICD-10-CM | POA: Diagnosis not present

## 2016-12-13 DIAGNOSIS — M4800 Spinal stenosis, site unspecified: Secondary | ICD-10-CM | POA: Diagnosis not present

## 2016-12-13 DIAGNOSIS — R918 Other nonspecific abnormal finding of lung field: Secondary | ICD-10-CM | POA: Diagnosis not present

## 2016-12-13 DIAGNOSIS — M1990 Unspecified osteoarthritis, unspecified site: Secondary | ICD-10-CM | POA: Diagnosis not present

## 2016-12-13 DIAGNOSIS — J3089 Other allergic rhinitis: Secondary | ICD-10-CM | POA: Diagnosis not present

## 2016-12-14 ENCOUNTER — Other Ambulatory Visit (HOSPITAL_COMMUNITY): Payer: Self-pay | Admitting: Internal Medicine

## 2016-12-14 DIAGNOSIS — R011 Cardiac murmur, unspecified: Secondary | ICD-10-CM

## 2016-12-21 ENCOUNTER — Ambulatory Visit (HOSPITAL_COMMUNITY)
Admission: RE | Admit: 2016-12-21 | Discharge: 2016-12-21 | Disposition: A | Discharge status: Home / Self Care | Payer: Medicare Other | Source: Ambulatory Visit | Attending: Internal Medicine | Admitting: Internal Medicine

## 2016-12-21 DIAGNOSIS — R011 Cardiac murmur, unspecified: Secondary | ICD-10-CM | POA: Diagnosis not present

## 2016-12-21 DIAGNOSIS — I5030 Unspecified diastolic (congestive) heart failure: Secondary | ICD-10-CM | POA: Diagnosis not present

## 2016-12-21 NOTE — Progress Notes (Signed)
  Echocardiogram 2D Echocardiogram has been performed.  Jennette Dubin 12/21/2016, 3:29 PM

## 2017-01-24 ENCOUNTER — Other Ambulatory Visit: Payer: Self-pay | Admitting: *Deleted

## 2017-01-25 ENCOUNTER — Other Ambulatory Visit (HOSPITAL_BASED_OUTPATIENT_CLINIC_OR_DEPARTMENT_OTHER): Payer: Medicare Other

## 2017-01-25 ENCOUNTER — Ambulatory Visit (HOSPITAL_COMMUNITY)
Admission: RE | Admit: 2017-01-25 | Discharge: 2017-01-25 | Disposition: A | Discharge status: Home / Self Care | Payer: Medicare Other | Source: Ambulatory Visit | Attending: Internal Medicine | Admitting: Internal Medicine

## 2017-01-25 DIAGNOSIS — J432 Centrilobular emphysema: Secondary | ICD-10-CM | POA: Insufficient documentation

## 2017-01-25 DIAGNOSIS — C3491 Malignant neoplasm of unspecified part of right bronchus or lung: Secondary | ICD-10-CM

## 2017-01-25 DIAGNOSIS — J8410 Pulmonary fibrosis, unspecified: Secondary | ICD-10-CM | POA: Diagnosis not present

## 2017-01-25 DIAGNOSIS — I2510 Atherosclerotic heart disease of native coronary artery without angina pectoris: Secondary | ICD-10-CM | POA: Insufficient documentation

## 2017-01-25 DIAGNOSIS — J90 Pleural effusion, not elsewhere classified: Secondary | ICD-10-CM | POA: Diagnosis not present

## 2017-01-25 DIAGNOSIS — I700 Atherosclerosis of aorta: Secondary | ICD-10-CM | POA: Diagnosis not present

## 2017-01-25 LAB — CBC WITH DIFFERENTIAL/PLATELET
BASO%: 0.2 % (ref 0.0–2.0)
BASOS ABS: 0 10*3/uL (ref 0.0–0.1)
EOS ABS: 0.1 10*3/uL (ref 0.0–0.5)
EOS%: 2.3 % (ref 0.0–7.0)
HCT: 38.1 % (ref 34.8–46.6)
HEMOGLOBIN: 12.4 g/dL (ref 11.6–15.9)
LYMPH%: 14.7 % (ref 14.0–49.7)
MCH: 31.7 pg (ref 25.1–34.0)
MCHC: 32.5 g/dL (ref 31.5–36.0)
MCV: 97.4 fL (ref 79.5–101.0)
MONO#: 0.8 10*3/uL (ref 0.1–0.9)
MONO%: 15.1 % — ABNORMAL HIGH (ref 0.0–14.0)
NEUT%: 67.7 % (ref 38.4–76.8)
NEUTROS ABS: 3.6 10*3/uL (ref 1.5–6.5)
PLATELETS: 156 10*3/uL (ref 145–400)
RBC: 3.91 10*6/uL (ref 3.70–5.45)
RDW: 16.3 % — ABNORMAL HIGH (ref 11.2–14.5)
WBC: 5.3 10*3/uL (ref 3.9–10.3)
lymph#: 0.8 10*3/uL — ABNORMAL LOW (ref 0.9–3.3)

## 2017-01-25 LAB — COMPREHENSIVE METABOLIC PANEL
ALT: 16 U/L (ref 0–55)
AST: 19 U/L (ref 5–34)
Albumin: 4 g/dL (ref 3.5–5.0)
Alkaline Phosphatase: 61 U/L (ref 40–150)
Anion Gap: 8 mEq/L (ref 3–11)
BUN: 18.6 mg/dL (ref 7.0–26.0)
CALCIUM: 9.6 mg/dL (ref 8.4–10.4)
CO2: 27 mEq/L (ref 22–29)
CREATININE: 0.8 mg/dL (ref 0.6–1.1)
Chloride: 106 mEq/L (ref 98–109)
EGFR: 71 mL/min/{1.73_m2} — ABNORMAL LOW (ref >90)
Glucose: 114 mg/dl (ref 70–140)
POTASSIUM: 4.4 meq/L (ref 3.5–5.1)
Sodium: 141 mEq/L (ref 136–145)
Total Bilirubin: 0.43 mg/dL (ref 0.20–1.20)
Total Protein: 7.4 g/dL (ref 6.4–8.3)

## 2017-01-25 MED ORDER — IOPAMIDOL (ISOVUE-300) INJECTION 61%
INTRAVENOUS | Status: AC
  Administered 2017-01-25: 75 mL
  Filled 2017-01-25: qty 75

## 2017-01-29 ENCOUNTER — Telehealth: Payer: Self-pay | Admitting: Internal Medicine

## 2017-01-29 ENCOUNTER — Encounter: Payer: Self-pay | Admitting: Internal Medicine

## 2017-01-29 ENCOUNTER — Ambulatory Visit (HOSPITAL_BASED_OUTPATIENT_CLINIC_OR_DEPARTMENT_OTHER): Payer: Medicare Other | Admitting: Internal Medicine

## 2017-01-29 ENCOUNTER — Other Ambulatory Visit: Payer: Medicare Other

## 2017-01-29 DIAGNOSIS — C3490 Malignant neoplasm of unspecified part of unspecified bronchus or lung: Secondary | ICD-10-CM

## 2017-01-29 DIAGNOSIS — Z85118 Personal history of other malignant neoplasm of bronchus and lung: Secondary | ICD-10-CM

## 2017-01-29 DIAGNOSIS — Z923 Personal history of irradiation: Secondary | ICD-10-CM | POA: Diagnosis not present

## 2017-01-29 DIAGNOSIS — J449 Chronic obstructive pulmonary disease, unspecified: Secondary | ICD-10-CM

## 2017-01-29 DIAGNOSIS — Z9221 Personal history of antineoplastic chemotherapy: Secondary | ICD-10-CM

## 2017-01-29 NOTE — Telephone Encounter (Signed)
Gave patient AVS and calender per 5/21 los. Central radiology to contact patient with Ct schedule.

## 2017-01-29 NOTE — Progress Notes (Signed)
[facility]  [phone] Fax:(336) [phone]  OFFICE PROGRESS NOTE   Burnard Bunting, MD [place] [address]  DIAGNOSIS: Small cell lung carcinoma, limited stage   Primary site: Lung (Right)   Staging method: AJCC 7th Edition   Clinical: Stage IIIA (T2a, N2, M0) signed by Curt Bears, MD on 08/06/2013  3:32 PM   Summary: Stage IIIA (T2a, N2, M0)  PRIOR THERAPY: 1) Systemic chemotherapy with carboplatin for an AUC of 5 given on day 1 and etoposide 120 mg per meter squared on days 1, 2 and 3 with Neulasta support given on day 4. This given concurrent with radiation. Status post 4 cycles 2) she declined prophylactic cranial irradiation.  CURRENT THERAPY: Observation.  DISEASE STAGE: Small cell lung carcinoma, limited stage   Primary site: Lung (Right)   Staging method: AJCC 7th Edition   Clinical: Stage IIIA (T2a, N2, M0) signed by Curt Bears, MD on 08/06/2013  3:32 PM   Summary: Stage IIIA (T2a, N2, M0)  CHEMOTHERAPY INTENT: curative  CURRENT # OF CHEMOTHERAPY CYCLES: 4  CURRENT ANTIEMETICS: Zofran, dexamethasone and Compazine  CURRENT SMOKING STATUS: Former smoker, quit 09/12/1991  ORAL CHEMOTHERAPY AND CONSENT: N/A  CURRENT BISPHOSPHONATES USE: None  PAIN MANAGEMENT: Hydrocodone and acetaminophen, tramadol  NARCOTICS INDUCED CONSTIPATION: Mild  LIVING WILL AND CODE STATUS: ?  INTERVAL HISTORY: Elizabeth Humphrey 83 y.o. female returns to the clinic today for follow-up visit accompanied by her husband. The patient is feeling fine today with no specific complaints. She has no chest pain, shortness breath, cough or hemoptysis. She denied having any recent weight loss or night sweats. She has no nausea, vomiting, diarrhea or constipation. She has no fever or chills. She had repeat CT scan of the chest performed recently and she is here for evaluation and discussion of her scan results.  MEDICAL HISTORY: Past Medical  History:  Diagnosis Date  . Allergy    bee stings/anaphylaxis  . Arthritis   . COPD (chronic obstructive pulmonary disease) ([facility]) 07/30/13  . Cough   . Heart murmur   . History of pneumonia   . History of radiation therapy 08/26/13-10/20/13   59.4 gray to central chest area  . Hyperlipidemia    hx of  . Lung cancer ([facility]) dx'd 07/2013  . Pyelonephritis     ALLERGIES:  is allergic to neosporin [neomycin-bacitracin zn-polymyx].  MEDICATIONS:  Current Outpatient Prescriptions  Medication Sig Dispense Refill  . acetaminophen (TYLENOL) 325 MG tablet Take 650 mg by mouth every 6 (six) hours as needed (pain).    [name] aspirin EC 81 MG tablet Take 81 mg by mouth at bedtime.    . donepezil (ARICEPT) 10 MG tablet     . ibuprofen (ADVIL,MOTRIN) 200 MG tablet Take 400 mg by mouth every 6 (six) hours as needed for headache or moderate pain. Reported on 01/31/2016    . memantine (NAMENDA) 10 MG tablet Take 10 mg by mouth daily.    . simvastatin (ZOCOR) 40 MG tablet Take 40 mg by mouth daily.     No current facility-administered medications for this visit.     SURGICAL HISTORY:  Past Surgical History:  Procedure Laterality Date  . CATARACT EXTRACTION     both eyes  . DILATION AND CURETTAGE OF UTERUS  yrs ago  . TONSILLECTOMY    . VIDEO BRONCHOSCOPY WITH ENDOBRONCHIAL ULTRASOUND N/A 08/04/2013   Procedure: VIDEO BRONCHOSCOPY WITH ENDOBRONCHIAL ULTRASOUND;  Surgeon: Melrose Nakayama, MD;  Location: [facility];  Service: Thoracic;  Laterality: N/A;    REVIEW OF SYSTEMS:  A comprehensive review of systems was negative.   PHYSICAL EXAMINATION: General appearance: alert, cooperative, appears stated age and no distress Head: Normocephalic, without obvious abnormality, atraumatic Neck: no adenopathy, no carotid bruit, no JVD, supple, symmetrical, trachea midline and thyroid not enlarged, symmetric, no tenderness/mass/nodules Lymph nodes: Cervical, supraclavicular, and axillary nodes normal. Resp:  clear to auscultation bilaterally Back: symmetric, no curvature. ROM normal. No CVA tenderness. Cardio: regular rate and rhythm, S1, S2 normal, no murmur, click, rub or gallop GI: soft, non-tender; bowel sounds normal; no masses,  no organomegaly Extremities: extremities normal, atraumatic, no cyanosis or edema  ECOG PERFORMANCE STATUS: 1 - Symptomatic but completely ambulatory  There were no vitals taken for this visit.  LABORATORY DATA: Lab Results  Component Value Date   WBC 5.3 01/25/2017   HGB 12.4 01/25/2017   HCT 38.1 01/25/2017   MCV 97.4 01/25/2017   PLT 156 01/25/2017      Chemistry      Component Value Date/Time   NA 141 01/25/2017 0813   K 4.4 01/25/2017 0813   CL 108 10/06/2013 0625   CO2 27 01/25/2017 0813   BUN 18.6 01/25/2017 0813   CREATININE 0.8 01/25/2017 0813      Component Value Date/Time   CALCIUM 9.6 01/25/2017 0813   ALKPHOS 61 01/25/2017 0813   AST 19 01/25/2017 0813   ALT 16 01/25/2017 0813   BILITOT 0.43 01/25/2017 0813       RADIOGRAPHIC STUDIES: Ct Chest W Contrast  Result Date: 01/25/2017 CLINICAL DATA:  83 year old female with history of lung cancer diagnosed in November 2014. Status post chemotherapy and radiation therapy now complete. Followup examination. EXAM: CT CHEST WITH CONTRAST TECHNIQUE: Multidetector CT imaging of the chest was performed during intravenous contrast administration. CONTRAST:  75 mL of Isovue-300. COMPARISON:  Chest CT 07/24/2016. FINDINGS: Cardiovascular: Heart size is normal. There is no significant pericardial fluid, thickening or pericardial calcification. There is aortic atherosclerosis, as well as atherosclerosis of the great vessels of the mediastinum and the coronary arteries, including calcified atherosclerotic plaque in the left main, left anterior descending, left circumflex and right coronary arteries. Severe thickening calcification of the aortic valve, mitral annulus and posterior leaflet of the mitral  valve. Mediastinum/Nodes: No pathologically enlarged mediastinal or hilar lymph nodes. Esophagus is unremarkable in appearance. No axillary lymphadenopathy. Lungs/Pleura: There continues to be extensive mass-like areas of architectural distortion throughout the medial aspect of the right mid to upper hemithorax involving portions of the right upper and lower lobes, similar to prior examinations, compatible with postradiation mass-like fibrosis. Additional areas of nodular peripheral pleuroparenchymal architectural distortion are also noted, most evident in the right upper lobe near the apex and posteriorly, also similar to the prior examination. Small right pleural effusion lying dependently (unchanged). No left pleural effusion. Multiple small 2-4 mm pulmonary nodules are again noted in the left lower lobe, unchanged compared to prior examinations. No other definite new larger more suspicious appearing pulmonary nodules or masses are noted. No acute consolidative airspace disease. Mild diffuse bronchial wall thickening with mild centrilobular emphysema. Upper Abdomen: Aortic atherosclerosis. Musculoskeletal: T10 compression fracture with 25% loss of anterior vertebral body height, unchanged compared to prior examination. There are no aggressive appearing lytic or blastic lesions noted in the visualized portions of the skeleton. IMPRESSION: 1. Stable examination demonstrating chronic postradiation mass-like fibrosis in the right lung with stable chronic small right pleural effusion. Micronodularity in the left lower  lobe is also unchanged (likely areas of mucoid impaction). 2. Mild diffuse bronchial wall thickening with mild centrilobular emphysema; imaging findings suggestive of underlying COPD. 3. Aortic atherosclerosis, in addition to left main and 3 vessel coronary artery disease. 4. There are severe calcifications of the aortic valve, mitral annulus and posterior leaflet of mitral valve. Echocardiographic  correlation for evaluation of potential valvular dysfunction may be warranted if clinically indicated. 5. Additional incidental findings, as above. Electronically Signed   By: Vinnie Langton M.D.   On: 01/25/2017 10:17    ASSESSMENT/PLAN:  This is a very pleasant 83 years old white female with limited stage small cell lung cancer diagnosed more than 2.5 years ago. She status post systemic chemotherapy with carboplatin and etoposide concurrent with radiation and she declined prophylactic cranial irradiation. The patient is currently on observation and the recent CT scan showed no evidence for disease recurrence. I discussed the scan results with the patient and her husband. I recommended for her to continue on observation with repeat CT scan of the chest in 6 months. She was advised to call immediately if she has any concerning symptoms in the interval. The patient and her husband agreed to the current plan. I spent 10 minutes counseling the patient face to face. The total time spent in the appointment was 15 minutes.  Disclaimer: This note was dictated with voice recognition software. Similar sounding words can inadvertently be transcribed and may not be corrected upon review.

## 2017-01-31 DIAGNOSIS — E7800 Pure hypercholesterolemia, unspecified: Secondary | ICD-10-CM | POA: Diagnosis not present

## 2017-01-31 DIAGNOSIS — I2510 Atherosclerotic heart disease of native coronary artery without angina pectoris: Secondary | ICD-10-CM | POA: Diagnosis not present

## 2017-01-31 DIAGNOSIS — I350 Nonrheumatic aortic (valve) stenosis: Secondary | ICD-10-CM | POA: Diagnosis not present

## 2017-01-31 DIAGNOSIS — J8410 Pulmonary fibrosis, unspecified: Secondary | ICD-10-CM | POA: Diagnosis not present

## 2017-02-08 DIAGNOSIS — R0989 Other specified symptoms and signs involving the circulatory and respiratory systems: Secondary | ICD-10-CM | POA: Diagnosis not present

## 2017-02-12 DIAGNOSIS — E7800 Pure hypercholesterolemia, unspecified: Secondary | ICD-10-CM | POA: Diagnosis not present

## 2017-02-12 DIAGNOSIS — I2510 Atherosclerotic heart disease of native coronary artery without angina pectoris: Secondary | ICD-10-CM | POA: Diagnosis not present

## 2017-02-12 DIAGNOSIS — I350 Nonrheumatic aortic (valve) stenosis: Secondary | ICD-10-CM | POA: Diagnosis not present

## 2017-02-20 DIAGNOSIS — I2510 Atherosclerotic heart disease of native coronary artery without angina pectoris: Secondary | ICD-10-CM | POA: Diagnosis not present

## 2017-02-20 DIAGNOSIS — I350 Nonrheumatic aortic (valve) stenosis: Secondary | ICD-10-CM | POA: Diagnosis not present

## 2017-02-20 DIAGNOSIS — E7800 Pure hypercholesterolemia, unspecified: Secondary | ICD-10-CM | POA: Diagnosis not present

## 2017-02-20 DIAGNOSIS — J8410 Pulmonary fibrosis, unspecified: Secondary | ICD-10-CM | POA: Diagnosis not present

## 2017-04-19 DIAGNOSIS — L570 Actinic keratosis: Secondary | ICD-10-CM | POA: Diagnosis not present

## 2017-04-19 DIAGNOSIS — Z85828 Personal history of other malignant neoplasm of skin: Secondary | ICD-10-CM | POA: Diagnosis not present

## 2017-04-19 DIAGNOSIS — C44629 Squamous cell carcinoma of skin of left upper limb, including shoulder: Secondary | ICD-10-CM | POA: Diagnosis not present

## 2017-04-19 DIAGNOSIS — C44619 Basal cell carcinoma of skin of left upper limb, including shoulder: Secondary | ICD-10-CM | POA: Diagnosis not present

## 2017-05-18 DIAGNOSIS — H04123 Dry eye syndrome of bilateral lacrimal glands: Secondary | ICD-10-CM | POA: Diagnosis not present

## 2017-05-18 DIAGNOSIS — H353131 Nonexudative age-related macular degeneration, bilateral, early dry stage: Secondary | ICD-10-CM | POA: Diagnosis not present

## 2017-05-18 DIAGNOSIS — H52203 Unspecified astigmatism, bilateral: Secondary | ICD-10-CM | POA: Diagnosis not present

## 2017-05-18 DIAGNOSIS — Z961 Presence of intraocular lens: Secondary | ICD-10-CM | POA: Diagnosis not present

## 2017-06-25 DIAGNOSIS — M4800 Spinal stenosis, site unspecified: Secondary | ICD-10-CM | POA: Diagnosis not present

## 2017-06-25 DIAGNOSIS — Z23 Encounter for immunization: Secondary | ICD-10-CM | POA: Diagnosis not present

## 2017-06-25 DIAGNOSIS — M5416 Radiculopathy, lumbar region: Secondary | ICD-10-CM | POA: Diagnosis not present

## 2017-06-25 DIAGNOSIS — H8113 Benign paroxysmal vertigo, bilateral: Secondary | ICD-10-CM | POA: Diagnosis not present

## 2017-06-25 DIAGNOSIS — R918 Other nonspecific abnormal finding of lung field: Secondary | ICD-10-CM | POA: Diagnosis not present

## 2017-06-25 DIAGNOSIS — E7849 Other hyperlipidemia: Secondary | ICD-10-CM | POA: Diagnosis not present

## 2017-06-25 DIAGNOSIS — R011 Cardiac murmur, unspecified: Secondary | ICD-10-CM | POA: Diagnosis not present

## 2017-06-25 DIAGNOSIS — M1990 Unspecified osteoarthritis, unspecified site: Secondary | ICD-10-CM | POA: Diagnosis not present

## 2017-06-25 DIAGNOSIS — R413 Other amnesia: Secondary | ICD-10-CM | POA: Diagnosis not present

## 2017-06-25 DIAGNOSIS — Z6828 Body mass index (BMI) 28.0-28.9, adult: Secondary | ICD-10-CM | POA: Diagnosis not present

## 2017-06-25 DIAGNOSIS — R7301 Impaired fasting glucose: Secondary | ICD-10-CM | POA: Diagnosis not present

## 2017-06-25 DIAGNOSIS — C3490 Malignant neoplasm of unspecified part of unspecified bronchus or lung: Secondary | ICD-10-CM | POA: Diagnosis not present

## 2017-07-27 DIAGNOSIS — C44629 Squamous cell carcinoma of skin of left upper limb, including shoulder: Secondary | ICD-10-CM | POA: Diagnosis not present

## 2017-07-27 DIAGNOSIS — C44529 Squamous cell carcinoma of skin of other part of trunk: Secondary | ICD-10-CM | POA: Diagnosis not present

## 2017-07-27 DIAGNOSIS — L570 Actinic keratosis: Secondary | ICD-10-CM | POA: Diagnosis not present

## 2017-07-27 DIAGNOSIS — H61021 Chronic perichondritis of right external ear: Secondary | ICD-10-CM | POA: Diagnosis not present

## 2017-07-27 DIAGNOSIS — H61022 Chronic perichondritis of left external ear: Secondary | ICD-10-CM | POA: Diagnosis not present

## 2017-07-27 DIAGNOSIS — Z85828 Personal history of other malignant neoplasm of skin: Secondary | ICD-10-CM | POA: Diagnosis not present

## 2017-07-30 ENCOUNTER — Encounter (HOSPITAL_COMMUNITY): Payer: Self-pay

## 2017-07-30 ENCOUNTER — Other Ambulatory Visit: Payer: Medicare Other

## 2017-07-30 ENCOUNTER — Ambulatory Visit (HOSPITAL_COMMUNITY)
Admission: RE | Admit: 2017-07-30 | Discharge: 2017-07-30 | Disposition: A | Discharge status: Home / Self Care | Payer: Medicare Other | Source: Ambulatory Visit | Attending: Internal Medicine | Admitting: Internal Medicine

## 2017-07-30 ENCOUNTER — Other Ambulatory Visit (HOSPITAL_BASED_OUTPATIENT_CLINIC_OR_DEPARTMENT_OTHER): Payer: Medicare Other

## 2017-07-30 DIAGNOSIS — C3490 Malignant neoplasm of unspecified part of unspecified bronchus or lung: Secondary | ICD-10-CM | POA: Insufficient documentation

## 2017-07-30 DIAGNOSIS — Z923 Personal history of irradiation: Secondary | ICD-10-CM | POA: Diagnosis not present

## 2017-07-30 DIAGNOSIS — I2510 Atherosclerotic heart disease of native coronary artery without angina pectoris: Secondary | ICD-10-CM | POA: Insufficient documentation

## 2017-07-30 DIAGNOSIS — Z85118 Personal history of other malignant neoplasm of bronchus and lung: Secondary | ICD-10-CM

## 2017-07-30 DIAGNOSIS — I700 Atherosclerosis of aorta: Secondary | ICD-10-CM | POA: Insufficient documentation

## 2017-07-30 DIAGNOSIS — J449 Chronic obstructive pulmonary disease, unspecified: Secondary | ICD-10-CM | POA: Diagnosis not present

## 2017-07-30 LAB — CBC WITH DIFFERENTIAL/PLATELET
BASO%: 0.3 % (ref 0.0–2.0)
Basophils Absolute: 0 10*3/uL (ref 0.0–0.1)
EOS%: 1.4 % (ref 0.0–7.0)
Eosinophils Absolute: 0.1 10*3/uL (ref 0.0–0.5)
HCT: 37.3 % (ref 34.8–46.6)
HGB: 12.2 g/dL (ref 11.6–15.9)
LYMPH%: 13.7 % — ABNORMAL LOW (ref 14.0–49.7)
MCH: 31.9 pg (ref 25.1–34.0)
MCHC: 32.7 g/dL (ref 31.5–36.0)
MCV: 97.6 fL (ref 79.5–101.0)
MONO#: 0.7 10*3/uL (ref 0.1–0.9)
MONO%: 11.4 % (ref 0.0–14.0)
NEUT#: 4.6 10*3/uL (ref 1.5–6.5)
NEUT%: 73.2 % (ref 38.4–76.8)
Platelets: 140 10*3/uL — ABNORMAL LOW (ref 145–400)
RBC: 3.82 10*6/uL (ref 3.70–5.45)
RDW: 16.1 % — AB (ref 11.2–14.5)
WBC: 6.3 10*3/uL (ref 3.9–10.3)
lymph#: 0.9 10*3/uL (ref 0.9–3.3)

## 2017-07-30 LAB — COMPREHENSIVE METABOLIC PANEL
ALT: 15 U/L (ref 0–55)
ANION GAP: 8 meq/L (ref 3–11)
AST: 17 U/L (ref 5–34)
Albumin: 4 g/dL (ref 3.5–5.0)
Alkaline Phosphatase: 59 U/L (ref 40–150)
BILIRUBIN TOTAL: 0.49 mg/dL (ref 0.20–1.20)
BUN: 16.6 mg/dL (ref 7.0–26.0)
CALCIUM: 9.5 mg/dL (ref 8.4–10.4)
CHLORIDE: 106 meq/L (ref 98–109)
CO2: 26 mEq/L (ref 22–29)
Creatinine: 0.8 mg/dL (ref 0.6–1.1)
EGFR: >60 mL/min/{1.73_m2} (ref >60)
Glucose: 105 mg/dl (ref 70–140)
Potassium: 4.5 mEq/L (ref 3.5–5.1)
Sodium: 141 mEq/L (ref 136–145)
TOTAL PROTEIN: 7.3 g/dL (ref 6.4–8.3)

## 2017-07-30 MED ORDER — IOPAMIDOL (ISOVUE-300) INJECTION 61%
75.0000 mL | Freq: Once | INTRAVENOUS | Status: AC | PRN
  Administered 2017-07-30: 75 mL via INTRAVENOUS

## 2017-07-30 MED ORDER — IOPAMIDOL (ISOVUE-300) INJECTION 61%
INTRAVENOUS | Status: AC
  Filled 2017-07-30: qty 75

## 2017-08-06 ENCOUNTER — Ambulatory Visit: Payer: Medicare Other | Admitting: Internal Medicine

## 2017-08-08 ENCOUNTER — Telehealth: Payer: Self-pay | Admitting: Internal Medicine

## 2017-08-08 ENCOUNTER — Ambulatory Visit (HOSPITAL_BASED_OUTPATIENT_CLINIC_OR_DEPARTMENT_OTHER): Payer: Medicare Other | Admitting: Internal Medicine

## 2017-08-08 ENCOUNTER — Encounter: Payer: Self-pay | Admitting: Internal Medicine

## 2017-08-08 VITALS — BP 118/42 | HR 98 | Resp 18 | Ht 67.0 in | Wt 177.3 lb

## 2017-08-08 DIAGNOSIS — Z85118 Personal history of other malignant neoplasm of bronchus and lung: Secondary | ICD-10-CM

## 2017-08-08 DIAGNOSIS — C3490 Malignant neoplasm of unspecified part of unspecified bronchus or lung: Secondary | ICD-10-CM

## 2017-08-08 NOTE — Telephone Encounter (Signed)
Gave avs and calendar for May and June 2019

## 2017-08-08 NOTE — Progress Notes (Signed)
[facility] Humphrey  Telephone:(336) [phone] Fax:(336) [phone]  OFFICE PROGRESS NOTE   Burnard Bunting, MD [place] [address]  DIAGNOSIS: Small cell lung carcinoma, limited stage   Primary site: Lung (Right)   Staging method: AJCC 7th Edition   Clinical: Stage IIIA (T2a, N2, M0) signed by Curt Bears, MD on 08/06/2013  3:32 PM   Summary: Stage IIIA (T2a, N2, M0)  PRIOR THERAPY: 1) Systemic chemotherapy with carboplatin for an AUC of 5 given on day 1 and etoposide 120 mg per meter squared on days 1, 2 and 3 with Neulasta support given on day 4. This given concurrent with radiation. Status post 4 cycles 2) she declined prophylactic cranial irradiation.  CURRENT THERAPY: Observation.  DISEASE STAGE: Small cell lung carcinoma, limited stage   Primary site: Lung (Right)   Staging method: AJCC 7th Edition   Clinical: Stage IIIA (T2a, N2, M0) signed by Curt Bears, MD on 08/06/2013  3:32 PM   Summary: Stage IIIA (T2a, N2, M0)  CHEMOTHERAPY INTENT: curative  CURRENT # OF CHEMOTHERAPY CYCLES: 4  CURRENT ANTIEMETICS: Zofran, dexamethasone and Compazine  CURRENT SMOKING STATUS: Former smoker, quit 09/12/1991  ORAL CHEMOTHERAPY AND CONSENT: N/A  CURRENT BISPHOSPHONATES USE: None  PAIN MANAGEMENT: Hydrocodone and acetaminophen, tramadol  NARCOTICS INDUCED CONSTIPATION: Mild  LIVING WILL AND CODE STATUS: ?  INTERVAL HISTORY: Elizabeth Humphrey 83 y.o. female returns to the clinic today for 6 months follow-up visit.  The patient is feeling fine today with no specific complaints.  She denied having any chest pain, shortness of breath, cough or hemoptysis.  She denied having any fever or chills.  She has no nausea, vomiting, diarrhea or constipation.  She had repeat CT scan of the chest performed recently and she is here for evaluation and discussion of her discuss results.   MEDICAL HISTORY: Past Medical History:  Diagnosis Date  . Allergy    bee stings/anaphylaxis  . Arthritis   . COPD (chronic obstructive pulmonary disease) ([facility]) 07/30/13  . Cough   . Heart murmur   . History of pneumonia   . History of radiation therapy 08/26/13-10/20/13   59.4 gray to central chest area  . Hyperlipidemia    hx of  . Lung cancer ([facility]) dx'd 07/2013  . Pyelonephritis     ALLERGIES:  is allergic to neosporin [neomycin-bacitracin zn-polymyx].  MEDICATIONS:  Current Outpatient Medications  Medication Sig Dispense Refill  . acetaminophen (TYLENOL) 325 MG tablet Take 650 mg by mouth every 6 (six) hours as needed (pain).    [name] aspirin EC 81 MG tablet Take 81 mg by mouth at bedtime.    . donepezil (ARICEPT) 10 MG tablet     . ibuprofen (ADVIL,MOTRIN) 200 MG tablet Take 400 mg by mouth every 6 (six) hours as needed for headache or moderate pain. Reported on 01/31/2016    . memantine (NAMENDA) 10 MG tablet Take 10 mg by mouth daily.    . simvastatin (ZOCOR) 40 MG tablet Take 40 mg by mouth daily.     No current facility-administered medications for this visit.     SURGICAL HISTORY:  Past Surgical History:  Procedure Laterality Date  . CATARACT EXTRACTION     both eyes  . DILATION AND CURETTAGE OF UTERUS  yrs ago  . TONSILLECTOMY    . VIDEO BRONCHOSCOPY WITH ENDOBRONCHIAL ULTRASOUND N/A 08/04/2013   Procedure: VIDEO BRONCHOSCOPY WITH ENDOBRONCHIAL ULTRASOUND;  Surgeon: Melrose Nakayama, MD;  Location: [facility];  Service: Thoracic;  Laterality: N/A;    REVIEW OF SYSTEMS:  A comprehensive review of systems was negative.   PHYSICAL EXAMINATION: General appearance: alert, cooperative, appears stated age and no distress Head: Normocephalic, without obvious abnormality, atraumatic Neck: no adenopathy, no carotid bruit, no JVD, supple, symmetrical, trachea midline and thyroid not enlarged, symmetric, no tenderness/mass/nodules Lymph nodes: Cervical, supraclavicular, and axillary nodes normal. Resp: clear to auscultation bilaterally Back:  symmetric, no curvature. ROM normal. No CVA tenderness. Cardio: regular rate and rhythm, S1, S2 normal, no murmur, click, rub or gallop GI: soft, non-tender; bowel sounds normal; no masses,  no organomegaly Extremities: extremities normal, atraumatic, no cyanosis or edema  ECOG PERFORMANCE STATUS: 1 - Symptomatic but completely ambulatory  Blood pressure (!) 118/42, pulse 98, resp. rate 18, height 5\' 7"  (1.702 m), weight 177 lb 4.8 oz (80.4 kg), SpO2 98 %.  LABORATORY DATA: Lab Results  Component Value Date   WBC 6.3 07/30/2017   HGB 12.2 07/30/2017   HCT 37.3 07/30/2017   MCV 97.6 07/30/2017   PLT 140 (L) 07/30/2017      Chemistry      Component Value Date/Time   NA 141 07/30/2017 1106   K 4.5 07/30/2017 1106   CL 108 10/06/2013 0625   CO2 26 07/30/2017 1106   BUN 16.6 07/30/2017 1106   CREATININE 0.8 07/30/2017 1106      Component Value Date/Time   CALCIUM 9.5 07/30/2017 1106   ALKPHOS 59 07/30/2017 1106   AST 17 07/30/2017 1106   ALT 15 07/30/2017 1106   BILITOT 0.49 07/30/2017 1106       RADIOGRAPHIC STUDIES: Ct Chest W Contrast  Result Date: 07/30/2017 CLINICAL DATA:  Restaging lung cancer. Chemotherapy and radiation therapy complete. EXAM: CT CHEST WITH CONTRAST TECHNIQUE: Multidetector CT imaging of the chest was performed during intravenous contrast administration. CONTRAST:  53mL ISOVUE-300 IOPAMIDOL (ISOVUE-300) INJECTION 61% COMPARISON:  CT 01/25/2017 FINDINGS: Cardiovascular: Normal heart size. Coronary artery calcification and aortic atherosclerotic calcification. Mediastinum/Nodes: No axillary or supraclavicular adenopathy. No mediastinal hilar adenopathy. No pericardial fluid. Lungs/Pleura: RIGHT perihilar consolidation with bronchiectasis in a linear pattern typical of radiation change. Volume loss in the RIGHT hemithorax. No new nodularity. LEFT lung clear Upper Abdomen: Adrenal glands are normal. Limited view of the liver unremarkable. Musculoskeletal: No  aggressive osseous lesion IMPRESSION: 1. Postradiation change in the RIGHT hemithorax without evidence of lung cancer recurrence. 2. No adenopathy or metastatic disease. 3. Coronary artery calcification and Aortic Atherosclerosis (ICD10-I70.0). Electronically Signed   By: Suzy Bouchard M.D.   On: 07/30/2017 14:17    ASSESSMENT/PLAN:  This is a very pleasant 83 years old white female with limited stage small cell lung cancer diagnosed in November 2014. She status post systemic chemotherapy with carboplatin and etoposide concurrent with radiation and she declined prophylactic cranial irradiation. The patient is doing very well today.  She has been in observation since the completion of her treatment. She had repeat CT scan of the chest performed recently that showed no evidence for disease recurrence or metastasis. I discussed the scan results with the patient and her husband and recommended for her to continue on observation with repeat CT scan of the chest in 6 months. She was advised to call immediately if she has any concerning symptoms in the interval. The patient and her husband agreed to the current plan. I spent 10 minutes counseling the patient face to face. The total time spent in the appointment was 15 minutes.  Disclaimer: This note was dictated with  voice recognition software. Similar sounding words can inadvertently be transcribed and may not be corrected upon review.

## 2017-09-17 ENCOUNTER — Other Ambulatory Visit (HOSPITAL_COMMUNITY): Payer: Self-pay | Admitting: Internal Medicine

## 2017-09-17 ENCOUNTER — Ambulatory Visit (HOSPITAL_COMMUNITY)
Admission: RE | Admit: 2017-09-17 | Discharge: 2017-09-17 | Disposition: A | Discharge status: Home / Self Care | Payer: Medicare Other | Source: Ambulatory Visit | Attending: Surgery | Admitting: Surgery

## 2017-09-17 DIAGNOSIS — M79604 Pain in right leg: Secondary | ICD-10-CM

## 2017-09-17 DIAGNOSIS — R600 Localized edema: Secondary | ICD-10-CM | POA: Diagnosis not present

## 2017-09-18 ENCOUNTER — Ambulatory Visit (INDEPENDENT_AMBULATORY_CARE_PROVIDER_SITE_OTHER)
Admission: RE | Admit: 2017-09-18 | Discharge: 2017-09-18 | Disposition: A | Discharge status: Home / Self Care | Payer: Medicare Other | Source: Ambulatory Visit | Attending: Vascular Surgery | Admitting: Vascular Surgery

## 2017-09-18 ENCOUNTER — Ambulatory Visit (HOSPITAL_COMMUNITY)
Admission: RE | Admit: 2017-09-18 | Discharge: 2017-09-18 | Disposition: A | Discharge status: Home / Self Care | Payer: Medicare Other | Source: Ambulatory Visit | Attending: Vascular Surgery | Admitting: Vascular Surgery

## 2017-09-18 ENCOUNTER — Other Ambulatory Visit (HOSPITAL_COMMUNITY): Payer: Self-pay | Admitting: Internal Medicine

## 2017-09-18 DIAGNOSIS — M79604 Pain in right leg: Secondary | ICD-10-CM

## 2017-09-18 LAB — VAS US LOWER EXTREMITY ARTERIAL DUPLEX
LSFMPSV: -89 cm/s
Left ant tibial distal sys: 42 cm/s
Left super femoral dist sys PSV: -80 cm/s
Left super femoral prox sys PSV: 107 cm/s
RATIBDISTSYS: -55 cm/s
RSFMPSV: -93 cm/s
RSFPPSV: -128 cm/s
RTIBDISTSYS: 81 cm/s
Right super femoral dist sys PSV: 107 cm/s
left post tibial dist sys: 91 cm/s

## 2017-09-21 ENCOUNTER — Encounter (HOSPITAL_COMMUNITY): Payer: Medicare Other

## 2017-10-30 DIAGNOSIS — Z85828 Personal history of other malignant neoplasm of skin: Secondary | ICD-10-CM | POA: Diagnosis not present

## 2017-10-30 DIAGNOSIS — H61021 Chronic perichondritis of right external ear: Secondary | ICD-10-CM | POA: Diagnosis not present

## 2017-10-30 DIAGNOSIS — D485 Neoplasm of uncertain behavior of skin: Secondary | ICD-10-CM | POA: Diagnosis not present

## 2017-10-30 DIAGNOSIS — L821 Other seborrheic keratosis: Secondary | ICD-10-CM | POA: Diagnosis not present

## 2017-10-30 DIAGNOSIS — H61002 Unspecified perichondritis of left external ear: Secondary | ICD-10-CM | POA: Diagnosis not present

## 2017-10-30 DIAGNOSIS — L570 Actinic keratosis: Secondary | ICD-10-CM | POA: Diagnosis not present

## 2017-12-10 DIAGNOSIS — R7301 Impaired fasting glucose: Secondary | ICD-10-CM | POA: Diagnosis not present

## 2017-12-10 DIAGNOSIS — E7849 Other hyperlipidemia: Secondary | ICD-10-CM | POA: Diagnosis not present

## 2017-12-17 ENCOUNTER — Other Ambulatory Visit: Payer: Self-pay | Admitting: Internal Medicine

## 2017-12-17 DIAGNOSIS — M5416 Radiculopathy, lumbar region: Secondary | ICD-10-CM

## 2017-12-17 DIAGNOSIS — Z1389 Encounter for screening for other disorder: Secondary | ICD-10-CM | POA: Diagnosis not present

## 2017-12-17 DIAGNOSIS — M79604 Pain in right leg: Secondary | ICD-10-CM | POA: Diagnosis not present

## 2017-12-17 DIAGNOSIS — M4800 Spinal stenosis, site unspecified: Secondary | ICD-10-CM

## 2017-12-17 DIAGNOSIS — R413 Other amnesia: Secondary | ICD-10-CM | POA: Diagnosis not present

## 2017-12-17 DIAGNOSIS — M545 Low back pain: Secondary | ICD-10-CM | POA: Diagnosis not present

## 2017-12-17 DIAGNOSIS — H8110 Benign paroxysmal vertigo, unspecified ear: Secondary | ICD-10-CM | POA: Diagnosis not present

## 2017-12-17 DIAGNOSIS — C3490 Malignant neoplasm of unspecified part of unspecified bronchus or lung: Secondary | ICD-10-CM | POA: Diagnosis not present

## 2017-12-17 DIAGNOSIS — Z0000 Encounter for general adult medical examination without abnormal findings: Secondary | ICD-10-CM | POA: Diagnosis not present

## 2017-12-17 DIAGNOSIS — Z6828 Body mass index (BMI) 28.0-28.9, adult: Secondary | ICD-10-CM | POA: Diagnosis not present

## 2017-12-17 DIAGNOSIS — N63 Unspecified lump in unspecified breast: Secondary | ICD-10-CM | POA: Diagnosis not present

## 2017-12-17 DIAGNOSIS — R011 Cardiac murmur, unspecified: Secondary | ICD-10-CM | POA: Diagnosis not present

## 2017-12-17 DIAGNOSIS — R600 Localized edema: Secondary | ICD-10-CM | POA: Diagnosis not present

## 2017-12-26 ENCOUNTER — Ambulatory Visit
Admission: RE | Admit: 2017-12-26 | Discharge: 2017-12-26 | Disposition: A | Discharge status: Home / Self Care | Payer: Medicare Other | Source: Ambulatory Visit | Attending: Internal Medicine | Admitting: Internal Medicine

## 2017-12-26 DIAGNOSIS — M47816 Spondylosis without myelopathy or radiculopathy, lumbar region: Secondary | ICD-10-CM | POA: Diagnosis not present

## 2017-12-26 DIAGNOSIS — M5126 Other intervertebral disc displacement, lumbar region: Secondary | ICD-10-CM | POA: Diagnosis not present

## 2017-12-26 DIAGNOSIS — M48061 Spinal stenosis, lumbar region without neurogenic claudication: Secondary | ICD-10-CM | POA: Diagnosis not present

## 2017-12-26 DIAGNOSIS — M79604 Pain in right leg: Secondary | ICD-10-CM

## 2017-12-26 DIAGNOSIS — M5416 Radiculopathy, lumbar region: Secondary | ICD-10-CM

## 2017-12-26 DIAGNOSIS — M4800 Spinal stenosis, site unspecified: Secondary | ICD-10-CM

## 2017-12-26 MED ORDER — GADOBENATE DIMEGLUMINE 529 MG/ML IV SOLN
15.0000 mL | Freq: Once | INTRAVENOUS | Status: AC | PRN
  Administered 2017-12-26: 15 mL via INTRAVENOUS

## 2018-01-30 DIAGNOSIS — M48061 Spinal stenosis, lumbar region without neurogenic claudication: Secondary | ICD-10-CM | POA: Diagnosis not present

## 2018-01-30 DIAGNOSIS — M4727 Other spondylosis with radiculopathy, lumbosacral region: Secondary | ICD-10-CM | POA: Diagnosis not present

## 2018-01-30 DIAGNOSIS — M5416 Radiculopathy, lumbar region: Secondary | ICD-10-CM | POA: Diagnosis not present

## 2018-01-30 DIAGNOSIS — M545 Low back pain: Secondary | ICD-10-CM | POA: Diagnosis not present

## 2018-01-30 DIAGNOSIS — M4316 Spondylolisthesis, lumbar region: Secondary | ICD-10-CM | POA: Diagnosis not present

## 2018-02-05 ENCOUNTER — Telehealth: Payer: Self-pay | Admitting: *Deleted

## 2018-02-07 ENCOUNTER — Ambulatory Visit (HOSPITAL_COMMUNITY)
Admission: RE | Admit: 2018-02-07 | Discharge: 2018-02-07 | Disposition: A | Discharge status: Home / Self Care | Payer: Medicare Other | Source: Ambulatory Visit | Attending: Internal Medicine | Admitting: Internal Medicine

## 2018-02-07 ENCOUNTER — Inpatient Hospital Stay: Payer: Medicare Other | Attending: Internal Medicine

## 2018-02-07 ENCOUNTER — Encounter (HOSPITAL_COMMUNITY): Payer: Self-pay

## 2018-02-07 DIAGNOSIS — I700 Atherosclerosis of aorta: Secondary | ICD-10-CM | POA: Diagnosis not present

## 2018-02-07 DIAGNOSIS — J439 Emphysema, unspecified: Secondary | ICD-10-CM | POA: Diagnosis not present

## 2018-02-07 DIAGNOSIS — S22078A Other fracture of T9-T10 vertebra, initial encounter for closed fracture: Secondary | ICD-10-CM | POA: Diagnosis not present

## 2018-02-07 DIAGNOSIS — M438X4 Other specified deforming dorsopathies, thoracic region: Secondary | ICD-10-CM | POA: Insufficient documentation

## 2018-02-07 DIAGNOSIS — Z85118 Personal history of other malignant neoplasm of bronchus and lung: Secondary | ICD-10-CM | POA: Insufficient documentation

## 2018-02-07 DIAGNOSIS — Y842 Radiological procedure and radiotherapy as the cause of abnormal reaction of the patient, or of later complication, without mention of misadventure at the time of the procedure: Secondary | ICD-10-CM | POA: Insufficient documentation

## 2018-02-07 DIAGNOSIS — C3490 Malignant neoplasm of unspecified part of unspecified bronchus or lung: Secondary | ICD-10-CM | POA: Diagnosis not present

## 2018-02-07 DIAGNOSIS — S22069A Unspecified fracture of T7-T8 vertebra, initial encounter for closed fracture: Secondary | ICD-10-CM | POA: Diagnosis not present

## 2018-02-07 DIAGNOSIS — Z5111 Encounter for antineoplastic chemotherapy: Secondary | ICD-10-CM | POA: Diagnosis not present

## 2018-02-07 LAB — CBC WITH DIFFERENTIAL/PLATELET
Basophils Absolute: 0 10*3/uL (ref 0.0–0.1)
Basophils Relative: 1 %
Eosinophils Absolute: 0.1 10*3/uL (ref 0.0–0.5)
Eosinophils Relative: 2 %
HCT: 35.1 % (ref 34.8–46.6)
Hemoglobin: 11.7 g/dL (ref 11.6–15.9)
LYMPHS ABS: 0.6 10*3/uL — AB (ref 0.9–3.3)
LYMPHS PCT: 14 %
MCH: 31.3 pg (ref 25.1–34.0)
MCHC: 33.3 g/dL (ref 31.5–36.0)
MCV: 94 fL (ref 79.5–101.0)
MONO ABS: 0.4 10*3/uL (ref 0.1–0.9)
MONOS PCT: 10 %
Neutro Abs: 3.2 10*3/uL (ref 1.5–6.5)
Neutrophils Relative %: 73 %
Platelets: 143 10*3/uL — ABNORMAL LOW (ref 145–400)
RBC: 3.73 MIL/uL (ref 3.70–5.45)
RDW: 16.8 % — AB (ref 11.2–14.5)
WBC: 4.4 10*3/uL (ref 3.9–10.3)

## 2018-02-07 LAB — COMPREHENSIVE METABOLIC PANEL
ALBUMIN: 4.1 g/dL (ref 3.5–5.0)
ALK PHOS: 64 U/L (ref 40–150)
ALT: 12 U/L (ref 0–55)
ANION GAP: 10 (ref 3–11)
AST: 19 U/L (ref 5–34)
BUN: 14 mg/dL (ref 7–26)
CALCIUM: 9.3 mg/dL (ref 8.4–10.4)
CO2: 25 mmol/L (ref 22–29)
Chloride: 106 mmol/L (ref 98–109)
Creatinine, Ser: 0.84 mg/dL (ref 0.60–1.10)
GFR calc Af Amer: >60 mL/min (ref >60)
GFR calc non Af Amer: >60 mL/min (ref >60)
GLUCOSE: 129 mg/dL (ref 70–140)
POTASSIUM: 3.8 mmol/L (ref 3.5–5.1)
SODIUM: 141 mmol/L (ref 136–145)
Total Bilirubin: 0.5 mg/dL (ref 0.2–1.2)
Total Protein: 7.2 g/dL (ref 6.4–8.3)

## 2018-02-07 MED ORDER — IOHEXOL 300 MG/ML  SOLN
75.0000 mL | Freq: Once | INTRAMUSCULAR | Status: AC | PRN
  Administered 2018-02-07: 75 mL via INTRAVENOUS

## 2018-02-11 DIAGNOSIS — M545 Low back pain: Secondary | ICD-10-CM | POA: Diagnosis not present

## 2018-02-13 ENCOUNTER — Inpatient Hospital Stay: Payer: Medicare Other | Attending: Internal Medicine | Admitting: Internal Medicine

## 2018-02-13 ENCOUNTER — Encounter: Payer: Self-pay | Admitting: Internal Medicine

## 2018-02-13 ENCOUNTER — Telehealth: Payer: Self-pay | Admitting: Internal Medicine

## 2018-02-13 VITALS — BP 120/68 | HR 83 | Temp 98.7°F | Resp 18 | Ht 67.0 in | Wt 171.4 lb

## 2018-02-13 DIAGNOSIS — C3490 Malignant neoplasm of unspecified part of unspecified bronchus or lung: Secondary | ICD-10-CM

## 2018-02-13 DIAGNOSIS — Z85118 Personal history of other malignant neoplasm of bronchus and lung: Secondary | ICD-10-CM | POA: Insufficient documentation

## 2018-02-13 NOTE — Progress Notes (Signed)
[facility]  [phone] [phone]  OFFICE PROGRESS NOTE   Burnard Bunting, MD [place] [address]  DIAGNOSIS: Small cell lung carcinoma, limited stage   Primary site: Lung (Right)   Staging method: AJCC 7th Edition   Clinical: Stage IIIA (T2a, N2, M0) signed by Curt Bears, MD on 08/06/2013  3:32 PM   Summary: Stage IIIA (T2a, N2, M0)  PRIOR THERAPY: 1) Systemic chemotherapy with carboplatin for an AUC of 5 given on day 1 and etoposide 120 mg per meter squared on days 1, 2 and 3 with Neulasta support given on day 4. This given concurrent with radiation. Status post 4 cycles 2) she declined prophylactic cranial irradiation.  CURRENT THERAPY: Observation.  DISEASE STAGE: Small cell lung carcinoma, limited stage   Primary site: Lung (Right)   Staging method: AJCC 7th Edition   Clinical: Stage IIIA (T2a, N2, M0) signed by Curt Bears, MD on 08/06/2013  3:32 PM   Summary: Stage IIIA (T2a, N2, M0)  CHEMOTHERAPY INTENT: curative  CURRENT # OF CHEMOTHERAPY CYCLES: 4  CURRENT ANTIEMETICS: Zofran, dexamethasone and Compazine  CURRENT SMOKING STATUS: Former smoker, quit 09/12/1991  ORAL CHEMOTHERAPY AND CONSENT: N/A  CURRENT BISPHOSPHONATES USE: None  PAIN MANAGEMENT: Hydrocodone and acetaminophen, tramadol  NARCOTICS INDUCED CONSTIPATION: Mild  LIVING WILL AND CODE STATUS: ?  INTERVAL HISTORY: Elizabeth Humphrey 84 y.o. female returns to the clinic today for follow-up visit accompanied by her husband.  The patient is feeling fine today with no specific complaints except for memory loss and low back pain.  She was seen recently by Dr. Vertell Limber and receiving steroid injection to the spinal stenosis.  The patient denied having any chest pain, shortness breath, cough or hemoptysis.  She denied having any fever or chills.  She has no nausea, vomiting, diarrhea or constipation.  She had repeat CT scan of the chest performed  recently and she is here for evaluation and discussion of her risk her results.   MEDICAL HISTORY: Past Medical History:  Diagnosis Date  . Allergy    bee stings/anaphylaxis  . Arthritis   . COPD (chronic obstructive pulmonary disease) ([place]) 07/30/13  . Cough   . Heart murmur   . History of pneumonia   . History of radiation therapy 08/26/13-10/20/13   59.4 gray to central chest area  . Hyperlipidemia    hx of  . Lung cancer ([place]) dx'd 07/2013  . Pyelonephritis     ALLERGIES:  is allergic to neosporin [neomycin-bacitracin zn-polymyx].  MEDICATIONS:  Current Outpatient Medications  Medication Sig Dispense Refill  . acetaminophen (TYLENOL) 325 MG tablet Take 650 mg by mouth every 6 (six) hours as needed (pain).    [name] aspirin EC 81 MG tablet Take 81 mg by mouth at bedtime.    . cholecalciferol (VITAMIN D) 1000 units tablet Take 1,000 Units by mouth daily.    [name] donepezil (ARICEPT) 10 MG tablet     . ibuprofen (ADVIL,MOTRIN) 200 MG tablet Take 400 mg by mouth every 6 (six) hours as needed for headache or moderate pain. Reported on 01/31/2016    . memantine (NAMENDA) 10 MG tablet Take 10 mg by mouth daily.    . simvastatin (ZOCOR) 40 MG tablet Take 40 mg by mouth daily.     No current facility-administered medications for this visit.     SURGICAL HISTORY:  Past Surgical History:  Procedure Laterality Date  . CATARACT EXTRACTION     both eyes  .  DILATION AND CURETTAGE OF UTERUS  yrs ago  . TONSILLECTOMY    . VIDEO BRONCHOSCOPY WITH ENDOBRONCHIAL ULTRASOUND N/A 08/04/2013   Procedure: VIDEO BRONCHOSCOPY WITH ENDOBRONCHIAL ULTRASOUND;  Surgeon: Melrose Nakayama, MD;  Location: [facility] OR;  Service: Thoracic;  Laterality: N/A;    REVIEW OF SYSTEMS:  A comprehensive review of systems was negative except for: Musculoskeletal: positive for back pain Neurological: positive for memory problems   PHYSICAL EXAMINATION: General appearance: alert, cooperative, appears stated age and no  distress Head: Normocephalic, without obvious abnormality, atraumatic Neck: no adenopathy, no carotid bruit, no JVD, supple, symmetrical, trachea midline and thyroid not enlarged, symmetric, no tenderness/mass/nodules Lymph nodes: Cervical, supraclavicular, and axillary nodes normal. Resp: clear to auscultation bilaterally Back: symmetric, no curvature. ROM normal. No CVA tenderness. Cardio: regular rate and rhythm, S1, S2 normal, no murmur, click, rub or gallop GI: soft, non-tender; bowel sounds normal; no masses,  no organomegaly Extremities: extremities normal, atraumatic, no cyanosis or edema  ECOG PERFORMANCE STATUS: 1 - Symptomatic but completely ambulatory  Blood pressure 120/68, pulse 83, temperature 98.7 F (37.1 C), temperature source Oral, resp. rate 18, height 5\' 7"  (1.702 m), weight 171 lb 6.4 oz (77.7 kg), SpO2 97 %.  LABORATORY DATA: Lab Results  Component Value Date   WBC 4.4 02/07/2018   HGB 11.7 02/07/2018   HCT 35.1 02/07/2018   MCV 94.0 02/07/2018   PLT 143 (L) 02/07/2018      Chemistry      Component Value Date/Time   NA 141 02/07/2018 1038   NA 141 07/30/2017 1106   K 3.8 02/07/2018 1038   K 4.5 07/30/2017 1106   CL 106 02/07/2018 1038   CO2 25 02/07/2018 1038   CO2 26 07/30/2017 1106   BUN 14 02/07/2018 1038   BUN 16.6 07/30/2017 1106   CREATININE 0.84 02/07/2018 1038   CREATININE 0.8 07/30/2017 1106      Component Value Date/Time   CALCIUM 9.3 02/07/2018 1038   CALCIUM 9.5 07/30/2017 1106   ALKPHOS 64 02/07/2018 1038   ALKPHOS 59 07/30/2017 1106   AST 19 02/07/2018 1038   AST 17 07/30/2017 1106   ALT 12 02/07/2018 1038   ALT 15 07/30/2017 1106   BILITOT 0.5 02/07/2018 1038   BILITOT 0.49 07/30/2017 1106       RADIOGRAPHIC STUDIES: Ct Chest W Contrast  Result Date: 02/07/2018 CLINICAL DATA:  Lung cancer, diagnosed 2014, status post chemotherapy and XRT EXAM: CT CHEST WITH CONTRAST TECHNIQUE: Multidetector CT imaging of the chest was  performed during intravenous contrast administration. CONTRAST:  28mL OMNIPAQUE IOHEXOL 300 MG/ML  SOLN COMPARISON:  07/30/2017 FINDINGS: Cardiovascular: Heart is normal in size.  Trace pericardial fluid. No evidence of thoracic aortic aneurysm. Atherosclerotic calcifications of the aortic arch. Three vessel coronary atherosclerosis. Mediastinum/Nodes: No suspicious mediastinal lymphadenopathy. Visualized thyroid is unremarkable. Lungs/Pleura: Radiation changes in the right upper lobe/paramediastinal region. Associated volume loss in the right hemithorax. Mild centrilobular emphysematous changes, upper lobe predominant. 4 mm left lower lobe nodule (series 5/image 93), unchanged, benign. Small right pleural effusion, grossly unchanged. No pneumothorax. Upper Abdomen: Visualized upper abdomen is grossly unremarkable. Musculoskeletal: Degenerative changes of the visualized thoracolumbar spine. Mild to moderate superior endplate compression fracture deformity at T8 (sagittal image 94), new. No retropulsion. Mild superior endplate compression fracture deformity at T10, chronic. IMPRESSION: Radiation changes in the right hemithorax. No evidence of recurrent or metastatic disease. Mild to moderate superior endplate compression fracture deformity at T8, new. No retropulsion. Mild superior endplate  compression fracture deformity at T10, chronic. Aortic Atherosclerosis (ICD10-I70.0) and Emphysema (ICD10-J43.9). Electronically Signed   By: Julian Hy M.D.   On: 02/07/2018 19:34    ASSESSMENT/PLAN:  This is a very pleasant 84 years old white female with limited stage small cell lung cancer diagnosed in November 2014. She status post systemic chemotherapy with carboplatin and etoposide concurrent with radiation and she declined prophylactic cranial irradiation. She has been on observation since that time.  The patient is feeling fine today with no concerning complaints. The recent CT scan of the chest showed no  concerning findings for disease progression.  I discussed the scan results with the patient and her husband and recommended for her to continue on observation with repeat CT scan of the chest in 6 months. She was advised to call immediately if she has any concerning symptoms in the interval. I spent 10 minutes counseling the patient face to face. The total time spent in the appointment was 15 minutes.  Disclaimer: This note was dictated with voice recognition software. Similar sounding words can inadvertently be transcribed and may not be corrected upon review.

## 2018-02-13 NOTE — Telephone Encounter (Signed)
Scheduled appt per 6/5 los - gave patient aVS and calender per los. Central radiology to contact patient with ct scan ,.

## 2018-03-26 DIAGNOSIS — M545 Low back pain: Secondary | ICD-10-CM | POA: Diagnosis not present

## 2018-04-03 DIAGNOSIS — C44719 Basal cell carcinoma of skin of left lower limb, including hip: Secondary | ICD-10-CM | POA: Diagnosis not present

## 2018-04-03 DIAGNOSIS — Z85828 Personal history of other malignant neoplasm of skin: Secondary | ICD-10-CM | POA: Diagnosis not present

## 2018-04-03 DIAGNOSIS — L821 Other seborrheic keratosis: Secondary | ICD-10-CM | POA: Diagnosis not present

## 2018-04-03 DIAGNOSIS — D045 Carcinoma in situ of skin of trunk: Secondary | ICD-10-CM | POA: Diagnosis not present

## 2018-04-03 DIAGNOSIS — D225 Melanocytic nevi of trunk: Secondary | ICD-10-CM | POA: Diagnosis not present

## 2018-04-03 DIAGNOSIS — L814 Other melanin hyperpigmentation: Secondary | ICD-10-CM | POA: Diagnosis not present

## 2018-04-03 DIAGNOSIS — D0439 Carcinoma in situ of skin of other parts of face: Secondary | ICD-10-CM | POA: Diagnosis not present

## 2018-04-03 DIAGNOSIS — L570 Actinic keratosis: Secondary | ICD-10-CM | POA: Diagnosis not present

## 2018-04-23 ENCOUNTER — Other Ambulatory Visit: Payer: Self-pay

## 2018-04-23 ENCOUNTER — Encounter (HOSPITAL_COMMUNITY): Payer: Self-pay

## 2018-04-23 ENCOUNTER — Emergency Department (HOSPITAL_COMMUNITY): Payer: Medicare Other

## 2018-04-23 ENCOUNTER — Emergency Department (HOSPITAL_COMMUNITY)
Admission: EM | Admit: 2018-04-23 | Discharge: 2018-04-23 | Disposition: A | Discharge status: Home / Self Care | Payer: Medicare Other | Attending: Emergency Medicine | Admitting: Emergency Medicine

## 2018-04-23 DIAGNOSIS — K644 Residual hemorrhoidal skin tags: Secondary | ICD-10-CM | POA: Diagnosis not present

## 2018-04-23 DIAGNOSIS — F0390 Unspecified dementia without behavioral disturbance: Secondary | ICD-10-CM | POA: Insufficient documentation

## 2018-04-23 DIAGNOSIS — K5900 Constipation, unspecified: Secondary | ICD-10-CM | POA: Diagnosis not present

## 2018-04-23 DIAGNOSIS — Z79899 Other long term (current) drug therapy: Secondary | ICD-10-CM | POA: Insufficient documentation

## 2018-04-23 DIAGNOSIS — J449 Chronic obstructive pulmonary disease, unspecified: Secondary | ICD-10-CM | POA: Insufficient documentation

## 2018-04-23 DIAGNOSIS — Z87891 Personal history of nicotine dependence: Secondary | ICD-10-CM | POA: Insufficient documentation

## 2018-04-23 DIAGNOSIS — N3000 Acute cystitis without hematuria: Secondary | ICD-10-CM | POA: Diagnosis not present

## 2018-04-23 DIAGNOSIS — Z7982 Long term (current) use of aspirin: Secondary | ICD-10-CM | POA: Insufficient documentation

## 2018-04-23 DIAGNOSIS — Z85118 Personal history of other malignant neoplasm of bronchus and lung: Secondary | ICD-10-CM | POA: Diagnosis not present

## 2018-04-23 DIAGNOSIS — K6289 Other specified diseases of anus and rectum: Secondary | ICD-10-CM | POA: Diagnosis present

## 2018-04-23 HISTORY — DX: Unspecified dementia, unspecified severity, without behavioral disturbance, psychotic disturbance, mood disturbance, and anxiety: F03.90

## 2018-04-23 LAB — CBC WITH DIFFERENTIAL/PLATELET
BASOS ABS: 0 10*3/uL (ref 0.0–0.1)
BASOS PCT: 0 %
EOS ABS: 0.1 10*3/uL (ref 0.0–0.7)
Eosinophils Relative: 2 %
HEMATOCRIT: 30.9 % — AB (ref 36.0–46.0)
HEMOGLOBIN: 10.3 g/dL — AB (ref 12.0–15.0)
Lymphocytes Relative: 10 %
Lymphs Abs: 0.5 10*3/uL — ABNORMAL LOW (ref 0.7–4.0)
MCH: 31.1 pg (ref 26.0–34.0)
MCHC: 33.3 g/dL (ref 30.0–36.0)
MCV: 93.4 fL (ref 78.0–100.0)
MONOS PCT: 14 %
Monocytes Absolute: 0.6 10*3/uL (ref 0.1–1.0)
NEUTROS ABS: 3.5 10*3/uL (ref 1.7–7.7)
NEUTROS PCT: 74 %
Platelets: 162 10*3/uL (ref 150–400)
RBC: 3.31 MIL/uL — AB (ref 3.87–5.11)
RDW: 17 % — ABNORMAL HIGH (ref 11.5–15.5)
WBC: 4.8 10*3/uL (ref 4.0–10.5)

## 2018-04-23 LAB — COMPREHENSIVE METABOLIC PANEL
ALBUMIN: 3.5 g/dL (ref 3.5–5.0)
ALK PHOS: 61 U/L (ref 38–126)
ALT: 13 U/L (ref 0–44)
ANION GAP: 9 (ref 5–15)
AST: 19 U/L (ref 15–41)
BILIRUBIN TOTAL: 0.5 mg/dL (ref 0.3–1.2)
BUN: 20 mg/dL (ref 8–23)
CALCIUM: 9.1 mg/dL (ref 8.9–10.3)
CO2: 27 mmol/L (ref 22–32)
Chloride: 107 mmol/L (ref 98–111)
Creatinine, Ser: 1.02 mg/dL — ABNORMAL HIGH (ref 0.44–1.00)
GFR calc Af Amer: 58 mL/min — ABNORMAL LOW (ref >60)
GFR calc non Af Amer: 50 mL/min — ABNORMAL LOW (ref >60)
GLUCOSE: 114 mg/dL — AB (ref 70–99)
Potassium: 3.1 mmol/L — ABNORMAL LOW (ref 3.5–5.1)
SODIUM: 143 mmol/L (ref 135–145)
TOTAL PROTEIN: 6.8 g/dL (ref 6.5–8.1)

## 2018-04-23 LAB — URINALYSIS, ROUTINE W REFLEX MICROSCOPIC
BILIRUBIN URINE: NEGATIVE
Glucose, UA: NEGATIVE mg/dL
Ketones, ur: NEGATIVE mg/dL
Nitrite: POSITIVE — AB
PROTEIN: 30 mg/dL — AB
SPECIFIC GRAVITY, URINE: 1.019 (ref 1.005–1.030)
WBC, UA: >50 WBC/hpf — ABNORMAL HIGH (ref 0–5)
pH: 5 (ref 5.0–8.0)

## 2018-04-23 MED ORDER — CEPHALEXIN 500 MG PO CAPS
500.0000 mg | ORAL_CAPSULE | Freq: Once | ORAL | Status: AC
  Administered 2018-04-23: 500 mg via ORAL
  Filled 2018-04-23: qty 1

## 2018-04-23 MED ORDER — HYDROCORTISONE ACETATE 25 MG RE SUPP
25.0000 mg | Freq: Once | RECTAL | Status: AC
  Administered 2018-04-23: 25 mg via RECTAL
  Filled 2018-04-23: qty 1

## 2018-04-23 MED ORDER — SENNOSIDES-DOCUSATE SODIUM 8.6-50 MG PO TABS: 1.0000 | ORAL_TABLET | Freq: Every day | ORAL | 0 refills | Status: AC

## 2018-04-23 MED ORDER — CEPHALEXIN 500 MG PO CAPS: 500.0000 mg | ORAL_CAPSULE | Freq: Three times a day (TID) | ORAL | 0 refills | Status: DC

## 2018-04-23 MED ORDER — HYDROCORTISONE ACETATE 25 MG RE SUPP: 25.0000 mg | Freq: Two times a day (BID) | RECTAL | 0 refills | Status: DC

## 2018-04-23 MED ORDER — SENNOSIDES-DOCUSATE SODIUM 8.6-50 MG PO TABS: 1.0000 | ORAL_TABLET | Freq: Every day | ORAL | 0 refills | Status: DC

## 2018-04-23 MED ORDER — SODIUM CHLORIDE 0.9 % IV BOLUS
1000.0000 mL | Freq: Once | INTRAVENOUS | Status: AC
  Administered 2018-04-23: 1000 mL via INTRAVENOUS

## 2018-04-23 NOTE — ED Triage Notes (Signed)
Patient's husband reports that his wife was c/o rectal pain and reports that she has a large hemorrhoid.

## 2018-04-23 NOTE — Discharge Instructions (Addendum)
Use anusol suppository twice daily.   Try senokot to help soften your stools.   Use sitz bath three times daily   You have a UTI. Take keflex three times daily for a week.   See your doctor. Follow up with surgeon only if you still have rectal pain in a week   Return to ER if you have severe rectal pain, uncontrolled rectal bleeding, fever, abdominal pain, vomiting.

## 2018-04-23 NOTE — ED Provider Notes (Addendum)
[facility] DEPT Provider Note   CSN: [id number] Arrival date & time: 04/23/18  1001     History   Chief Complaint Chief Complaint  Patient presents with  . Rectal Pain    HPI Elizabeth Humphrey is a 84 y.o. female history of COPD, dementia, previous lung cancer in remission here presenting with rectal pain.  Patient is demented at baseline and lives at home with her husband.  Husband noticed that her hemorrhoids seems a little swollen today and she has some rectal pain.  Patient states that she did have a normal bowel movement yesterday denies being constipated.  Patient denies any abdominal pain or fevers or vomiting.  Patient had previous lung cancer and is in remission.  Patient is currently under any treatment for her hemorrhoids.  The history is provided by the patient and the spouse.  Level V caveat- dementia   Past Medical History:  Diagnosis Date  . Allergy    bee stings/anaphylaxis  . Arthritis   . COPD (chronic obstructive pulmonary disease) ([place]) 07/30/13  . Cough   . Dementia   . Heart murmur   . History of pneumonia   . History of radiation therapy 08/26/13-10/20/13   59.4 gray to central chest area  . Hyperlipidemia    hx of  . Lung cancer ([place]) dx'd 07/2013  . Pyelonephritis     Patient Active Problem List   Diagnosis Date Noted  . Acute pyelonephritis 10/07/2013  . Protein-calorie malnutrition, severe ([place]) 10/06/2013  . UTI (urinary tract infection) 10/05/2013  . Small cell lung carcinoma ([place]) 08/06/2013  . COPD, moderate ([place]) 07/30/2013  . COPD (chronic obstructive pulmonary disease) ([facility]) 07/30/2013  . Lung mass 07/17/2013  . Cough 07/17/2013    Past Surgical History:  Procedure Laterality Date  . CATARACT EXTRACTION     both eyes  . DILATION AND CURETTAGE OF UTERUS  yrs ago  . TONSILLECTOMY    . VIDEO BRONCHOSCOPY WITH ENDOBRONCHIAL ULTRASOUND N/A 08/04/2013   Procedure: VIDEO BRONCHOSCOPY WITH ENDOBRONCHIAL  ULTRASOUND;  Surgeon: Melrose Nakayama, MD;  Location: [facility];  Service: Thoracic;  Laterality: N/A;     OB History   None      Home Medications    Prior to Admission medications   Medication Sig Start Date End Date Taking? Authorizing Provider  acetaminophen (TYLENOL) 325 MG tablet Take 650 mg by mouth every 6 (six) hours as needed (pain).    [provider]  aspirin EC 81 MG tablet Take 81 mg by mouth at bedtime.    [provider]  cephALEXin (KEFLEX) 500 MG capsule Take 1 capsule (500 mg total) by mouth 3 (three) times daily. 04/23/18   Drenda Freeze, MD  cholecalciferol (VITAMIN D) 1000 units tablet Take 1,000 Units by mouth daily.    [provider]  donepezil (ARICEPT) 10 MG tablet  12/17/15   [provider]  hydrocortisone (ANUSOL-HC) 25 MG suppository Place 1 suppository (25 mg total) rectally 2 (two) times daily. For 7 days 04/23/18   Drenda Freeze, MD  ibuprofen (ADVIL,MOTRIN) 200 MG tablet Take 400 mg by mouth every 6 (six) hours as needed for headache or moderate pain. Reported on 01/31/2016    [provider]  memantine (NAMENDA) 10 MG tablet Take 10 mg by mouth daily. 01/26/17   [provider]  senna-docusate (SENOKOT-S) 8.6-50 MG tablet Take 1 tablet by mouth daily. 04/23/18   Drenda Freeze, MD  simvastatin [facility])  40 MG tablet Take 40 mg by mouth daily.    [provider]    Family History Family History  Problem Relation Age of Onset  . Heart disease Mother   . Heart disease Father     Social History Social History   Tobacco Use  . Smoking status: Former Smoker    Packs/day: 0.50    Years: 50.00    Pack years: 25.00    Types: Cigarettes    Last attempt to quit: 09/12/1991    Years since quitting: 26.6  . Smokeless tobacco: Never Used  Substance Use Topics  . Alcohol use: Yes    Alcohol/week: 7.0 standard drinks    Types: 7 Standard drinks or equivalent per week    Comment: Vodka  daily  . Drug use: No     Allergies   Neosporin [neomycin-bacitracin zn-polymyx]   Review of Systems Review of Systems  Gastrointestinal:       Rectal pain   All other systems reviewed and are negative.    Physical Exam Updated Vital Signs BP (!) 115/59 (BP Location: Right Arm)   Pulse 94   Temp 97.9 F (36.6 C) (Oral)   Resp 20   Ht 5\' 7"  (1.702 m)   Wt 77.1 kg   SpO2 94%   BMI 26.63 kg/m   Physical Exam  Constitutional: She appears well-developed and well-nourished.  HENT:  Head: Normocephalic.  Mouth/Throat: Oropharynx is clear and moist.  Eyes: Pupils are equal, round, and reactive to light. Conjunctivae and EOM are normal.  Neck: Normal range of motion. Neck supple.  Cardiovascular: Normal rate, regular rhythm and normal heart sounds.  Pulmonary/Chest: Effort normal and breath sounds normal. No stridor. No respiratory distress. She has no wheezes.  Abdominal: Soft. Bowel sounds are normal. She exhibits no distension. There is no tenderness.  Genitourinary:  Genitourinary Comments: Multiple small external hemorrhoids, no obvious thromboses. No obvious stool impaction   Musculoskeletal: Normal range of motion.  Neurological: She is alert.  Demented, moving all extremities, nl gait   Skin: Skin is warm.  Psychiatric: She has a normal mood and affect.  Nursing note and vitals reviewed.    ED Treatments / Results  Labs (all labs ordered are listed, but only abnormal results are displayed) Labs Reviewed  CBC WITH DIFFERENTIAL/PLATELET - Abnormal; Notable for the following components:      Result Value   RBC 3.31 (*)    Hemoglobin 10.3 (*)    HCT 30.9 (*)    RDW 17.0 (*)    Lymphs Abs 0.5 (*)    All other components within normal limits  COMPREHENSIVE METABOLIC PANEL - Abnormal; Notable for the following components:   Potassium 3.1 (*)    Glucose, Bld 114 (*)    Creatinine, Ser 1.02 (*)    GFR calc non Af Amer 50 (*)    GFR calc Af Amer 58 (*)    All  other components within normal limits  URINALYSIS, ROUTINE W REFLEX MICROSCOPIC - Abnormal; Notable for the following components:   Color, Urine AMBER (*)    APPearance CLOUDY (*)    Hgb urine dipstick SMALL (*)    Protein, ur 30 (*)    Nitrite POSITIVE (*)    Leukocytes, UA MODERATE (*)    WBC, UA >50 (*)    Bacteria, UA MANY (*)    All other components within normal limits  URINE CULTURE    EKG None  Radiology Dg Abd Acute W/chest  Result Date: 04/23/2018 CLINICAL DATA:  84 year old female with history of treated lung cancer. Abdominal, rectal pain and constipation. EXAM: DG ABDOMEN ACUTE W/ 1V CHEST COMPARISON:  Chest CT 02/07/2018. Acute abdominal series 10/04/2013. FINDINGS: Architectural distortion in the right upper lung and at the right hilum compatible with radiation sequelae as described on the May CT. Lung volumes and mediastinal contours are stable. No acute pulmonary opacity. No pneumothorax or pneumoperitoneum. Upright and supine views of the abdomen. Non obstructed bowel gas pattern. Abdominal and pelvic visceral contours appear stable since 2015. No acute osseous abnormality identified. Iliofemoral calcified atherosclerosis. IMPRESSION: 1. Normal bowel gas pattern, no free air. 2. No acute cardiopulmonary abnormality. Stable post treatment appearance of the right lung. Electronically Signed   By: Genevie Ann M.D.   On: 04/23/2018 11:42    Procedures Procedures (including critical care time)  Medications Ordered in ED Medications  cephALEXin (KEFLEX) capsule 500 mg (has no administration in time range)  sodium chloride 0.9 % bolus 1,000 mL (0 mLs Intravenous Stopped 04/23/18 1155)  hydrocortisone (ANUSOL-HC) suppository 25 mg (25 mg Rectal Given 04/23/18 1126)     Initial Impression / Assessment and Plan / ED Course  I have reviewed the triage vital signs and the nursing notes.  Pertinent labs & imaging results that were available during my care of the patient were  reviewed by me and considered in my medical decision making (see chart for details).     Elizabeth Humphrey is a 84 y.o. female here with rectal pain. Patient slightly tachycardic likely from pain. Has obvious external hemorrhoids with no obvious thromboses. No obvious stool impaction. Will get labs, acute abdominal series. Will give anusol and IVF and reassess.   12:20 pm Hg 10, baseline around 11. WBC nl. Xray unremarkable. Chemistry unremarkable. Given anusol and IVF. Tachycardia resolved. Abdomen continues to be nontender. Will start on anusol, stool softeners. Will have her follow up with surgery outpatient in case she needs hemorrhoidectomy in the future. Stable for discharge   1:19 PM UA came back with UTI. Given keflex in the ED. Will dc home with course of keflex.   Final Clinical Impressions(s) / ED Diagnoses   Final diagnoses:  External hemorrhoids  Acute cystitis without hematuria    ED Discharge Orders         Ordered    hydrocortisone (ANUSOL-HC) 25 MG suppository  2 times daily     04/23/18 1223    senna-docusate (SENOKOT-S) 8.6-50 MG tablet  Daily,   Status:  Discontinued     04/23/18 1223    senna-docusate (SENOKOT-S) 8.6-50 MG tablet  Daily     04/23/18 1226    cephALEXin (KEFLEX) 500 MG capsule  3 times daily     04/23/18 1318           Drenda Freeze, MD 04/23/18 1222    Drenda Freeze, MD 04/23/18 1320

## 2018-04-25 LAB — URINE CULTURE

## 2018-04-26 ENCOUNTER — Telehealth: Payer: Self-pay | Admitting: *Deleted

## 2018-04-26 NOTE — Telephone Encounter (Signed)
Post ED Visit - Positive Culture Follow-up  Culture report reviewed by antimicrobial stewardship pharmacist:  []  Elenor Quinones, Pharm.D. []  Heide Guile, Pharm.D., BCPS AQ-ID []  Parks Neptune, Pharm.D., BCPS []  Alycia Rossetti, Pharm.D., BCPS []  [place], Pharm.D., BCPS, AAHIVP []  Legrand Como, Pharm.D., BCPS, AAHIVP []  Salome Arnt, PharmD, BCPS []  Johnnette Gourd, PharmD, BCPS []  Hughes Better, PharmD, BCPS []  Leeroy Cha, PharmD Jackson Latino, PharmD  Positive urine culture Treated with Cephalexin, organism sensitive to the same and no further patient follow-up is required at this time.  Harlon Flor [facility] 04/26/2018, 9:29 AM

## 2018-05-03 DIAGNOSIS — K645 Perianal venous thrombosis: Secondary | ICD-10-CM | POA: Diagnosis not present

## 2018-05-16 DIAGNOSIS — K649 Unspecified hemorrhoids: Secondary | ICD-10-CM | POA: Diagnosis not present

## 2018-05-17 ENCOUNTER — Telehealth: Payer: Self-pay

## 2018-05-17 NOTE — Telephone Encounter (Signed)
No answer/VM

## 2018-05-17 NOTE — Telephone Encounter (Signed)
-----   Message from Ladene Artist, MD sent at 05/16/2018  6:32 PM EDT ----- Regarding: RE: Rectal lesion? Hi Chris, We will contact her about proceeding with flex sig or colonoscopy. Prefer colonoscopy to examine entire colon colonoscopy in case this turns out to be a neoplasm. Thanks, Garnette Scheuermann please call her/husband to schedule office appt or direct colonoscopy. ----- Message ----- From: Ileana Roup, MD Sent: 05/16/2018   5:18 PM EDT To: Ladene Artist, MD Subject: Rectal lesion?                                 Pecolia Ades -  I saw Ms. Lachney in the office today. She was scoped by you in 2013 which is why I'm reaching out... She was here today for possible recent thrombosed hemorrhoid. She has a palpable soft lesion in her distal rectum and on anoscopy, I can't definitively visualize it... I saw she had a hypertrophied papilla on your scope... I was hoping y'all could get her in for at least a flex sig to further evaluate. Hopefully this is all nothing. Our office sent over a referral as well.  She does have mild/moderate dementia and small cell lung ca in remission (tx 2014) - not currently on chemo. I tried to have a goals of care discussion with the husband about everything but he wants additional information before he makes any final decisions.   Gerald Stabs

## 2018-05-18 ENCOUNTER — Telehealth: Payer: Self-pay | Admitting: Gastroenterology

## 2018-05-18 ENCOUNTER — Telehealth: Payer: Self-pay | Admitting: Physician Assistant

## 2018-05-18 NOTE — Telephone Encounter (Signed)
Spoke with husband again in regards to colonoscopy request. Explained that the patient was not scheduled for a colonoscopy yet, so patient does not need prep over the weekend. He urged that he would like a phone call EARLY on Monday morning so he could arrange for colonoscopy for his wife as she is suffering from both hemorrhoids and polyps (per Dr. Dema Severin) and needs colonoscopy before further intervention on her hemorrhoids.  I will send a note to [facility], Therapist, sports as well.  Ellouise Newer, PA-C 05/18/18 10:26

## 2018-05-18 NOTE — Telephone Encounter (Signed)
This patient's husband called Sat AM 9/6 since he did not speak to you yesterday (I saw you left VM after MS message about colonoscopy).. He was hoping I would call in prep with instructions so his wife could have the colonoscopy Monday, which I explained was not feasible.  Please contact him Monday morning with Dr. Lynne Leader recommendations and plan.

## 2018-05-20 ENCOUNTER — Encounter: Payer: Self-pay | Admitting: *Deleted

## 2018-05-20 NOTE — Progress Notes (Signed)
During chart review before up coming colonoscopy the 2018 echo was given to [facility]. He reviewed it and states "ok" for [place]. Echo report in epic and attached to chart.

## 2018-05-20 NOTE — Telephone Encounter (Signed)
I attempted to reach the patient again this am.  No answer and no machine.

## 2018-05-20 NOTE — Telephone Encounter (Signed)
Patient's husband called back. She will come for a pre-visit tomorrow and colon on 06/21/18.  I have added her to the cancellation list.

## 2018-05-20 NOTE — Telephone Encounter (Signed)
See other phone note from 05/17/18.  I attempted to reach the patient again this am.  No answer/machine.

## 2018-05-21 ENCOUNTER — Ambulatory Visit (AMBULATORY_SURGERY_CENTER): Payer: Self-pay

## 2018-05-21 ENCOUNTER — Other Ambulatory Visit: Payer: Self-pay

## 2018-05-21 VITALS — Ht 67.0 in | Wt 166.6 lb

## 2018-05-21 DIAGNOSIS — K6289 Other specified diseases of anus and rectum: Secondary | ICD-10-CM

## 2018-05-21 DIAGNOSIS — K629 Disease of anus and rectum, unspecified: Secondary | ICD-10-CM

## 2018-05-21 NOTE — Progress Notes (Signed)
Pt and husband came into the office today for her PV prior to her colon on 06/21/18. Pt's husband was very upset about his wife waiting until 06/21/18 for the colon and wanted something done ASAP. The husband was very difficult at the [facility] and was demanding to speak to the supervisor.He was not helpful in answering any medical questions.  He states, he wanted his wife seen today! I informed the husband I will speak to our supervisor. I spoke with Remo Lipps- supervisor and Dr Fuller Plan about working the pt in for an OV to be evaluated for the rectal mass. The husband was very impatient and wanted to know what was going to be done today to help his wife. He was not going to wait! Barbera Setters, Dr Lynne Leader nurse came to speak to the pt and husband. The pt was scheduled for a colon on 05/23/18 with Dr Fuller Plan and instructions and consent form were done by [facility]! The PV was not completed today, no medical questions were answered due to the patient's husband was very difficult. Gwyndolyn Saxon

## 2018-05-23 ENCOUNTER — Encounter: Payer: Self-pay | Admitting: Gastroenterology

## 2018-05-23 ENCOUNTER — Ambulatory Visit (AMBULATORY_SURGERY_CENTER): Payer: Medicare Other | Admitting: Gastroenterology

## 2018-05-23 VITALS — BP 102/58 | HR 95 | Temp 97.3°F | Resp 21 | Ht 67.0 in | Wt 166.0 lb

## 2018-05-23 DIAGNOSIS — D122 Benign neoplasm of ascending colon: Secondary | ICD-10-CM

## 2018-05-23 DIAGNOSIS — K635 Polyp of colon: Secondary | ICD-10-CM

## 2018-05-23 DIAGNOSIS — D123 Benign neoplasm of transverse colon: Secondary | ICD-10-CM

## 2018-05-23 DIAGNOSIS — K6289 Other specified diseases of anus and rectum: Secondary | ICD-10-CM

## 2018-05-23 DIAGNOSIS — Z86010 Personal history of colonic polyps: Secondary | ICD-10-CM | POA: Diagnosis not present

## 2018-05-23 DIAGNOSIS — D125 Benign neoplasm of sigmoid colon: Secondary | ICD-10-CM | POA: Diagnosis not present

## 2018-05-23 DIAGNOSIS — J449 Chronic obstructive pulmonary disease, unspecified: Secondary | ICD-10-CM | POA: Diagnosis not present

## 2018-05-23 MED ORDER — SODIUM CHLORIDE 0.9 % IV SOLN: 500.0000 mL | Freq: Once | INTRAVENOUS | Status: AC

## 2018-05-23 NOTE — Progress Notes (Signed)
Called to room to assist during endoscopic procedure.  Patient ID and intended procedure confirmed with present staff. Received instructions for my participation in the procedure from the performing physician.  

## 2018-05-23 NOTE — Progress Notes (Signed)
Report given to PACU, vss 

## 2018-05-23 NOTE — Patient Instructions (Signed)
YOU HAD AN ENDOSCOPIC PROCEDURE TODAY AT [facility]:   Refer to the procedure report that was given to you for any specific questions about what was found during the examination.  If the procedure report does not answer your questions, please call your gastroenterologist to clarify.  If you requested that your care partner not be given the details of your procedure findings, then the procedure report has been included in a sealed envelope for you to review at your convenience later.  YOU SHOULD EXPECT: Some feelings of bloating in the abdomen. Passage of more gas than usual.  Walking can help get rid of the air that was put into your GI tract during the procedure and reduce the bloating. If you had a lower endoscopy (such as a colonoscopy or flexible sigmoidoscopy) you may notice spotting of blood in your stool or on the toilet paper. If you underwent a bowel prep for your procedure, you may not have a normal bowel movement for a few days.  Please Note:  You might notice some irritation and congestion in your nose or some drainage.  This is from the oxygen used during your procedure.  There is no need for concern and it should clear up in a day or so.  SYMPTOMS TO REPORT IMMEDIATELY:   Following lower endoscopy (colonoscopy or flexible sigmoidoscopy):  Excessive amounts of blood in the stool  Significant tenderness or worsening of abdominal pains  Swelling of the abdomen that is new, acute  Fever of 100F or higher    For urgent or emergent issues, a gastroenterologist can be reached at any hour by calling [phone].   DIET:  We do recommend a small meal at first, but then you may proceed to your regular diet.  Drink plenty of fluids but you should avoid alcoholic beverages for 24 hours.  ACTIVITY:  You should plan to take it easy for the rest of today and you should NOT DRIVE or use heavy machinery until tomorrow (because of the sedation medicines used during the test).     FOLLOW UP: Our staff will call the number listed on your records the next business day following your procedure to check on you and address any questions or concerns that you may have regarding the information given to you following your procedure. If we do not reach you, we will leave a message.  However, if you are feeling well and you are not experiencing any problems, there is no need to return our call.  We will assume that you have returned to your regular daily activities without incident.  If any biopsies were taken you will be contacted by phone or by letter within the next 1-3 weeks.  Please call us at [phone] if you have not heard about the biopsies in 3 weeks.    SIGNATURES/CONFIDENTIALITY: You and/or your care partner have signed paperwork which will be entered into your electronic medical record.  These signatures attest to the fact that that the information above on your After Visit Summary has been reviewed and is understood.  Full responsibility of the confidentiality of this discharge information lies with you and/or your care-partner.    No  Aspirin,ibuprofen,naproxen,or other non-steroidal anti-inflammatory drugs for 2 weeks,resume remainder of medication. Follow up with Dr. Dema Severin. Information given on polyps and hemorrhoids.

## 2018-05-23 NOTE — Progress Notes (Signed)
Patient has alzheimers/dementia and is pleasant and cooperative. Patient's husband at bedside answering questions for admitting purposes.

## 2018-05-23 NOTE — Op Note (Signed)
[facility] Patient Name: Elizabeth Humphrey Procedure Date: 05/23/2018 11:33 AM MRN: [id number] Endoscopist: Ladene Artist , MD Age: 84 Referring MD:  Date of Birth: Jan 10, 1934 Gender: Female Account #: [id number] Procedure:                Colonoscopy Indications:              Surveillance: Personal history of adenomatous                            polyps on last colonoscopy > 5 years ago. Rectal                            lesion. Medicines:                Monitored Anesthesia Care Procedure:                Pre-Anesthesia Assessment:                           - Prior to the procedure, a History and Physical                            was performed, and patient medications and                            allergies were reviewed. The patient's tolerance of                            previous anesthesia was also reviewed. The risks                            and benefits of the procedure and the sedation                            options and risks were discussed with the patient.                            All questions were answered, and informed consent                            was obtained. Prior Anticoagulants: The patient has                            taken no previous anticoagulant or antiplatelet                            agents. ASA Grade Assessment: II - A patient with                            mild systemic disease. After reviewing the risks                            and benefits, the patient was deemed in  satisfactory condition to undergo the procedure.                           After obtaining informed consent, the colonoscope                            was passed under direct vision. Throughout the                            procedure, the patient's blood pressure, pulse, and                            oxygen saturations were monitored continuously. The                            Model [id number]-[id number]) scope was introduced                            through the anus and advanced to the the cecum,                            identified by appendiceal orifice and ileocecal                            valve. The ileocecal valve, appendiceal orifice,                            and rectum were photographed. The quality of the                            bowel preparation was good. The colonoscopy was                            performed without difficulty. The patient tolerated                            the procedure well. Scope In: 11:59:06 AM Scope Out: 12:15:00 PM Scope Withdrawal Time: 0 hours 13 minutes 1 second  Total Procedure Duration: 0 hours 15 minutes 54 seconds  Findings:                 Large external hemorrhoids were found on perianal                            exam.                           A 12 mm polyp was found in the sigmoid colon. The                            polyp was sessile. The polyp was removed with a hot                            snare. Resection and retrieval were complete.  Four sessile polyps were found in the transverse                            colon and ascending colon. The polyps were 6 to 8                            mm in size. These polyps were removed with a cold                            snare. Resection and retrieval were complete.                           Anal papilla(e) were hypertrophied.                           Internal hemorrhoids were found during                            retroflexion. The hemorrhoids were small and Grade                            I (internal hemorrhoids that do not prolapse).                           The exam was otherwise without abnormality on                            direct and retroflexion views. Complications:            No immediate complications. Estimated blood loss:                            None. Estimated Blood Loss:     Estimated blood loss: none. Impression:               - Large external hemorrhoids  found on perianal exam.                           - One 12 mm polyp in the sigmoid colon, removed                            with a hot snare. Resected and retrieved.                           - Four 6 to 8 mm polyps in the transverse colon (2)                            and in the ascending colon (2), removed with a cold                            snare. Resected and retrieved.                           - Anal papilla(e) were hypertrophied.                           -  Internal hemorrhoids.                           - The examination was otherwise normal on direct                            and retroflexion views. Recommendation:           - Patient has a contact number available for                            emergencies. The signs and symptoms of potential                            delayed complications were discussed with the                            patient. Return to normal activities tomorrow.                            Written discharge instructions were provided to the                            patient.                           - Resume previous diet.                           - Continue present medications.                           - No aspirin, ibuprofen, naproxen, or other                            non-steroidal anti-inflammatory drugs for 2 weeks                            after polyp removal.                           - Await pathology results.                           - No repeat colonoscopy due to age.                           - Follow up with Dr. Dema Severin. Ladene Artist, MD 05/23/2018 12:25:41 PM This report has been signed electronically.

## 2018-05-24 ENCOUNTER — Telehealth: Payer: Self-pay | Admitting: *Deleted

## 2018-05-24 NOTE — Telephone Encounter (Signed)
  Follow up Call-  Call back number 05/23/2018  Post procedure Call Back phone  # [phone] home, [phone]  Permission to leave phone message Yes  Some recent data might be hidden     Patient questions:  Do you have a fever, pain , or abdominal swelling? No. Pain Score  0 *  Have you tolerated food without any problems? Yes.    Have you been able to return to your normal activities? Yes.    Do you have any questions about your discharge instructions: Diet   No. Medications  No. Follow up visit  No.  Do you have questions or concerns about your Care? No.  Actions: * If pain score is 4 or above: No action needed, pain <4.

## 2018-05-30 DIAGNOSIS — Z86010 Personal history of colonic polyps: Secondary | ICD-10-CM | POA: Diagnosis not present

## 2018-05-30 DIAGNOSIS — Z7982 Long term (current) use of aspirin: Secondary | ICD-10-CM | POA: Diagnosis not present

## 2018-05-30 DIAGNOSIS — K644 Residual hemorrhoidal skin tags: Secondary | ICD-10-CM | POA: Diagnosis not present

## 2018-05-30 DIAGNOSIS — K648 Other hemorrhoids: Secondary | ICD-10-CM | POA: Diagnosis not present

## 2018-06-03 DIAGNOSIS — Z881 Allergy status to other antibiotic agents status: Secondary | ICD-10-CM | POA: Diagnosis not present

## 2018-06-03 DIAGNOSIS — K6289 Other specified diseases of anus and rectum: Secondary | ICD-10-CM | POA: Diagnosis not present

## 2018-06-03 DIAGNOSIS — K644 Residual hemorrhoidal skin tags: Secondary | ICD-10-CM | POA: Diagnosis not present

## 2018-06-10 ENCOUNTER — Encounter: Payer: Self-pay | Admitting: Gastroenterology

## 2018-06-15 DIAGNOSIS — Z23 Encounter for immunization: Secondary | ICD-10-CM | POA: Diagnosis not present

## 2018-06-17 DIAGNOSIS — Z1389 Encounter for screening for other disorder: Secondary | ICD-10-CM | POA: Diagnosis not present

## 2018-06-17 DIAGNOSIS — Z6826 Body mass index (BMI) 26.0-26.9, adult: Secondary | ICD-10-CM | POA: Diagnosis not present

## 2018-06-17 DIAGNOSIS — R7301 Impaired fasting glucose: Secondary | ICD-10-CM | POA: Diagnosis not present

## 2018-06-17 DIAGNOSIS — R413 Other amnesia: Secondary | ICD-10-CM | POA: Diagnosis not present

## 2018-06-17 DIAGNOSIS — R011 Cardiac murmur, unspecified: Secondary | ICD-10-CM | POA: Diagnosis not present

## 2018-06-17 DIAGNOSIS — R600 Localized edema: Secondary | ICD-10-CM | POA: Diagnosis not present

## 2018-06-17 DIAGNOSIS — R918 Other nonspecific abnormal finding of lung field: Secondary | ICD-10-CM | POA: Diagnosis not present

## 2018-06-17 DIAGNOSIS — M1990 Unspecified osteoarthritis, unspecified site: Secondary | ICD-10-CM | POA: Diagnosis not present

## 2018-06-17 DIAGNOSIS — C3490 Malignant neoplasm of unspecified part of unspecified bronchus or lung: Secondary | ICD-10-CM | POA: Diagnosis not present

## 2018-06-17 DIAGNOSIS — E7849 Other hyperlipidemia: Secondary | ICD-10-CM | POA: Diagnosis not present

## 2018-06-17 DIAGNOSIS — M5416 Radiculopathy, lumbar region: Secondary | ICD-10-CM | POA: Diagnosis not present

## 2018-06-17 DIAGNOSIS — M545 Low back pain: Secondary | ICD-10-CM | POA: Diagnosis not present

## 2018-06-21 ENCOUNTER — Encounter

## 2018-06-21 ENCOUNTER — Encounter: Payer: Medicare Other | Admitting: Gastroenterology

## 2018-08-01 DIAGNOSIS — C44722 Squamous cell carcinoma of skin of right lower limb, including hip: Secondary | ICD-10-CM | POA: Diagnosis not present

## 2018-08-01 DIAGNOSIS — Z85828 Personal history of other malignant neoplasm of skin: Secondary | ICD-10-CM | POA: Diagnosis not present

## 2018-08-01 DIAGNOSIS — D0461 Carcinoma in situ of skin of right upper limb, including shoulder: Secondary | ICD-10-CM | POA: Diagnosis not present

## 2018-08-01 DIAGNOSIS — L570 Actinic keratosis: Secondary | ICD-10-CM | POA: Diagnosis not present

## 2018-08-01 DIAGNOSIS — L821 Other seborrheic keratosis: Secondary | ICD-10-CM | POA: Diagnosis not present

## 2018-08-01 DIAGNOSIS — D0471 Carcinoma in situ of skin of right lower limb, including hip: Secondary | ICD-10-CM | POA: Diagnosis not present

## 2018-08-12 ENCOUNTER — Inpatient Hospital Stay: Payer: Medicare Other | Attending: Internal Medicine

## 2018-08-12 DIAGNOSIS — Z923 Personal history of irradiation: Secondary | ICD-10-CM | POA: Diagnosis not present

## 2018-08-12 DIAGNOSIS — Z7982 Long term (current) use of aspirin: Secondary | ICD-10-CM | POA: Diagnosis not present

## 2018-08-12 DIAGNOSIS — Z85118 Personal history of other malignant neoplasm of bronchus and lung: Secondary | ICD-10-CM | POA: Diagnosis not present

## 2018-08-12 DIAGNOSIS — Z79899 Other long term (current) drug therapy: Secondary | ICD-10-CM | POA: Insufficient documentation

## 2018-08-12 DIAGNOSIS — C3490 Malignant neoplasm of unspecified part of unspecified bronchus or lung: Secondary | ICD-10-CM

## 2018-08-12 LAB — CBC WITH DIFFERENTIAL (CANCER CENTER ONLY)
ABS IMMATURE GRANULOCYTES: 0.02 10*3/uL (ref 0.00–0.07)
BASOS PCT: 0 %
Basophils Absolute: 0 10*3/uL (ref 0.0–0.1)
Eosinophils Absolute: 0.2 10*3/uL (ref 0.0–0.5)
Eosinophils Relative: 3 %
HCT: 35.4 % — ABNORMAL LOW (ref 36.0–46.0)
HEMOGLOBIN: 11.2 g/dL — AB (ref 12.0–15.0)
IMMATURE GRANULOCYTES: 0 %
LYMPHS PCT: 14 %
Lymphs Abs: 0.8 10*3/uL (ref 0.7–4.0)
MCH: 29.4 pg (ref 26.0–34.0)
MCHC: 31.6 g/dL (ref 30.0–36.0)
MCV: 92.9 fL (ref 80.0–100.0)
MONOS PCT: 10 %
Monocytes Absolute: 0.6 10*3/uL (ref 0.1–1.0)
NEUTROS PCT: 73 %
Neutro Abs: 4.1 10*3/uL (ref 1.7–7.7)
PLATELETS: 153 10*3/uL (ref 150–400)
RBC: 3.81 MIL/uL — ABNORMAL LOW (ref 3.87–5.11)
RDW: 17.1 % — ABNORMAL HIGH (ref 11.5–15.5)
WBC Count: 5.6 10*3/uL (ref 4.0–10.5)
nRBC: 0 % (ref 0.0–0.2)

## 2018-08-12 LAB — CMP (CANCER CENTER ONLY)
ALBUMIN: 3.8 g/dL (ref 3.5–5.0)
ALT: 8 U/L (ref 0–44)
ANION GAP: 9 (ref 5–15)
AST: 16 U/L (ref 15–41)
Alkaline Phosphatase: 67 U/L (ref 38–126)
BUN: 12 mg/dL (ref 8–23)
CO2: 27 mmol/L (ref 22–32)
Calcium: 9.6 mg/dL (ref 8.9–10.3)
Chloride: 108 mmol/L (ref 98–111)
Creatinine: 0.86 mg/dL (ref 0.44–1.00)
GFR, Est AFR Am: >60 mL/min (ref >60)
GFR, Estimated: >60 mL/min (ref >60)
GLUCOSE: 116 mg/dL — AB (ref 70–99)
POTASSIUM: 4.1 mmol/L (ref 3.5–5.1)
SODIUM: 144 mmol/L (ref 135–145)
Total Bilirubin: 0.5 mg/dL (ref 0.3–1.2)
Total Protein: 7 g/dL (ref 6.5–8.1)

## 2018-08-13 ENCOUNTER — Ambulatory Visit (HOSPITAL_COMMUNITY)
Admission: RE | Admit: 2018-08-13 | Discharge: 2018-08-13 | Disposition: A | Discharge status: Home / Self Care | Payer: Medicare Other | Source: Ambulatory Visit | Attending: Internal Medicine | Admitting: Internal Medicine

## 2018-08-13 DIAGNOSIS — C3490 Malignant neoplasm of unspecified part of unspecified bronchus or lung: Secondary | ICD-10-CM

## 2018-08-13 DIAGNOSIS — J90 Pleural effusion, not elsewhere classified: Secondary | ICD-10-CM | POA: Diagnosis not present

## 2018-08-13 MED ORDER — IOHEXOL 300 MG/ML  SOLN
75.0000 mL | Freq: Once | INTRAMUSCULAR | Status: AC | PRN
  Administered 2018-08-13: 75 mL via INTRAVENOUS

## 2018-08-13 MED ORDER — SODIUM CHLORIDE (PF) 0.9 % IJ SOLN
INTRAMUSCULAR | Status: AC
  Filled 2018-08-13: qty 50

## 2018-08-14 ENCOUNTER — Other Ambulatory Visit: Payer: Self-pay

## 2018-08-14 ENCOUNTER — Inpatient Hospital Stay (HOSPITAL_BASED_OUTPATIENT_CLINIC_OR_DEPARTMENT_OTHER): Payer: Medicare Other | Admitting: Internal Medicine

## 2018-08-14 ENCOUNTER — Encounter: Payer: Self-pay | Admitting: Internal Medicine

## 2018-08-14 ENCOUNTER — Telehealth: Payer: Self-pay

## 2018-08-14 VITALS — BP 114/55 | HR 104 | Temp 97.0°F | Resp 16 | Ht 67.0 in | Wt 164.9 lb

## 2018-08-14 DIAGNOSIS — Z79899 Other long term (current) drug therapy: Secondary | ICD-10-CM | POA: Diagnosis not present

## 2018-08-14 DIAGNOSIS — Z923 Personal history of irradiation: Secondary | ICD-10-CM

## 2018-08-14 DIAGNOSIS — Z7982 Long term (current) use of aspirin: Secondary | ICD-10-CM

## 2018-08-14 DIAGNOSIS — Z85118 Personal history of other malignant neoplasm of bronchus and lung: Secondary | ICD-10-CM | POA: Diagnosis not present

## 2018-08-14 DIAGNOSIS — C3490 Malignant neoplasm of unspecified part of unspecified bronchus or lung: Secondary | ICD-10-CM

## 2018-08-14 NOTE — Progress Notes (Signed)
[facility]  Telephone:([phone]) [phone] Fax:(336) [phone]  OFFICE PROGRESS NOTE   Burnard Bunting, MD [place] [address]  DIAGNOSIS: Small cell lung carcinoma, limited stage   Primary site: Lung (Right)   Staging method: AJCC 7th Edition   Clinical: Stage IIIA (T2a, N2, M0) signed by Curt Bears, MD on 08/06/2013  3:32 PM   Summary: Stage IIIA (T2a, N2, M0)  PRIOR THERAPY: 1) Systemic chemotherapy with carboplatin for an AUC of 5 given on day 1 and etoposide 120 mg per meter squared on days 1, 2 and 3 with Neulasta support given on day 4. This given concurrent with radiation. Status post 4 cycles 2) she declined prophylactic cranial irradiation.  CURRENT THERAPY: Observation.  INTERVAL HISTORY: Elizabeth Humphrey 84 y.o. female returns to the clinic today for follow-up visit accompanied by her husband.  The patient is feeling fine today with no concerning complaints.  She denied having any chest pain, shortness of breath, cough or hemoptysis.  She denied having any fever or chills.  The patient has no nausea, vomiting, diarrhea or constipation.  She has been in observation for the last few years.  She had repeat CT scan of the chest performed recently and she is here for evaluation and discussion of her risk her results.   MEDICAL HISTORY: Past Medical History:  Diagnosis Date  . Allergy    bee stings/anaphylaxis  . Arthritis   . COPD (chronic obstructive pulmonary disease) ([facility]) 07/30/13  . Cough   . Dementia   . Heart murmur   . History of pneumonia   . History of radiation therapy 08/26/13-10/20/13   59.4 gray to central chest area  . Hyperlipidemia    hx of  . Lung cancer ([facility]) dx'd 07/2013  . Pyelonephritis     ALLERGIES:  is allergic to neosporin [neomycin-bacitracin zn-polymyx].  MEDICATIONS:  Current Outpatient Medications  Medication Sig Dispense Refill  . acetaminophen (TYLENOL) 325 MG tablet Take 650 mg by mouth every 6  (six) hours as needed (pain).    [name] aspirin EC 81 MG tablet Take 81 mg by mouth at bedtime.    . cephALEXin (KEFLEX) 500 MG capsule Take 1 capsule (500 mg total) by mouth 3 (three) times daily. (Patient not taking: Reported on 05/23/2018) 21 capsule 0  . cholecalciferol (VITAMIN D) 1000 units tablet Take 1,000 Units by mouth daily.    [name] donepezil (ARICEPT) 10 MG tablet     . hydrocortisone (ANUSOL-HC) 25 MG suppository Place 1 suppository (25 mg total) rectally 2 (two) times daily. For 7 days (Patient not taking: Reported on 05/23/2018) 15 suppository 0  . ibuprofen (ADVIL,MOTRIN) 200 MG tablet Take 400 mg by mouth every 6 (six) hours as needed for headache or moderate pain. Reported on 01/31/2016    . memantine (NAMENDA) 10 MG tablet Take 10 mg by mouth daily.    [name] senna-docusate (SENOKOT-S) 8.6-50 MG tablet Take 1 tablet by mouth daily. 30 tablet 0  . simvastatin (ZOCOR) 40 MG tablet Take 40 mg by mouth daily.     Current Facility-Administered Medications  Medication Dose Route Frequency Provider Last Rate Last Dose  . 0.9 %  sodium chloride infusion  500 mL Intravenous Once Ladene Artist, MD        SURGICAL HISTORY:  Past Surgical History:  Procedure Laterality Date  . CATARACT EXTRACTION     both eyes  . COLONOSCOPY    . DILATION AND CURETTAGE OF UTERUS  yrs  ago  . TONSILLECTOMY    . VIDEO BRONCHOSCOPY WITH ENDOBRONCHIAL ULTRASOUND N/A 08/04/2013   Procedure: VIDEO BRONCHOSCOPY WITH ENDOBRONCHIAL ULTRASOUND;  Surgeon: Melrose Nakayama, MD;  Location: [facility];  Service: Thoracic;  Laterality: N/A;    REVIEW OF SYSTEMS:  A comprehensive review of systems was negative except for: Constitutional: positive for fatigue   PHYSICAL EXAMINATION: General appearance: alert, cooperative, appears stated age and no distress Head: Normocephalic, without obvious abnormality, atraumatic Neck: no adenopathy, no carotid bruit, no JVD, supple, symmetrical, trachea midline and thyroid not enlarged,  symmetric, no tenderness/mass/nodules Lymph nodes: Cervical, supraclavicular, and axillary nodes normal. Resp: clear to auscultation bilaterally Back: symmetric, no curvature. ROM normal. No CVA tenderness. Cardio: regular rate and rhythm, S1, S2 normal, no murmur, click, rub or gallop GI: soft, non-tender; bowel sounds normal; no masses,  no organomegaly Extremities: extremities normal, atraumatic, no cyanosis or edema  ECOG PERFORMANCE STATUS: 1 - Symptomatic but completely ambulatory  Blood pressure (!) 114/55, pulse (!) 104, temperature (!) 97 F (36.1 C), temperature source Oral, resp. rate 16, height 5\' 7"  (1.702 m), weight 164 lb 14.4 oz (74.8 kg), SpO2 98 %.  LABORATORY DATA: Lab Results  Component Value Date   WBC 5.6 08/12/2018   HGB 11.2 (L) 08/12/2018   HCT 35.4 (L) 08/12/2018   MCV 92.9 08/12/2018   PLT 153 08/12/2018      Chemistry      Component Value Date/Time   NA 144 08/12/2018 0949   NA 141 07/30/2017 1106   K 4.1 08/12/2018 0949   K 4.5 07/30/2017 1106   CL 108 08/12/2018 0949   CO2 27 08/12/2018 0949   CO2 26 07/30/2017 1106   BUN 12 08/12/2018 0949   BUN 16.6 07/30/2017 1106   CREATININE 0.86 08/12/2018 0949   CREATININE 0.8 07/30/2017 1106      Component Value Date/Time   CALCIUM 9.6 08/12/2018 0949   CALCIUM 9.5 07/30/2017 1106   ALKPHOS 67 08/12/2018 0949   ALKPHOS 59 07/30/2017 1106   AST 16 08/12/2018 0949   AST 17 07/30/2017 1106   ALT 8 08/12/2018 0949   ALT 15 07/30/2017 1106   BILITOT 0.5 08/12/2018 0949   BILITOT 0.49 07/30/2017 1106       RADIOGRAPHIC STUDIES: Ct Chest W Contrast  Result Date: 08/14/2018 CLINICAL DATA:  Small-cell lung cancer. Post radiation therapy. Dementia. EXAM: CT CHEST WITH CONTRAST TECHNIQUE: Multidetector CT imaging of the chest was performed during intravenous contrast administration. CONTRAST:  27mL OMNIPAQUE IOHEXOL 300 MG/ML  SOLN COMPARISON:  02/07/2018 FINDINGS: Cardiovascular: Advanced aortic and  branch vessel atherosclerosis. Ulcerative plaque in the descending thoracic aorta. Normal heart size, without pericardial effusion. Aortic valve calcifications. Multivessel coronary artery atherosclerosis. No central pulmonary embolism, on this non-dedicated study. Mediastinum/Nodes: No supraclavicular adenopathy. No mediastinal or hilar adenopathy. Lungs/Pleura: Small right pleural effusion is similar and primarily positioned superiorly. Concurrent mild right pleural thickening. Probable secretions in the right and posterior tracheal walls. Moderate centrilobular emphysema. Minimal motion degradation inferiorly. A 4 mm left lower lobe pulmonary nodule is similar on image 93/7. Just cephalad to this, a 5 mm nodule is unchanged on image 87/7. Similar appearance and configuration of right paramediastinal consolidation and traction bronchiectasis with architectural distortion. No well-defined locally recurrent disease. Upper Abdomen: Normal imaged portions of the liver, spleen, stomach, pancreas, adrenal glands, kidneys. Musculoskeletal: Osteopenia. Healing anterior right fifth rib fracture is unchanged. Moderate T8 and mild T10 compression deformities are similar. No ventral canal encroachment. IMPRESSION:  1. Radiation changes in the right hemithorax, without recurrent or metastatic disease. 2. Aortic atherosclerosis (ICD10-I70.0), coronary artery atherosclerosis and emphysema (ICD10-J43.9). 3. Aortic valvular calcifications. Consider echocardiography to evaluate for valvular dysfunction. 4. Minimal motion degradation. 5. Similar small right pleural effusion. 6. Chronic T8 and T10 compression deformities. 7. Similar left lower lobe pulmonary nodules, benign. Electronically Signed   By: Abigail Miyamoto M.D.   On: 08/14/2018 08:10    ASSESSMENT/PLAN:  This is a very pleasant 84 years old white female with limited stage small cell lung cancer diagnosed in November 2014. She status post systemic chemotherapy with  carboplatin and etoposide concurrent with radiation and she declined prophylactic cranial irradiation. The patient has been on observation now for several years.  She is doing fine with no concerning complaints. Repeat CT scan of the chest performed recently showed no concerning findings for disease recurrence. I recommended for the patient to continue on observation with repeat CT scan of the chest in 6 months. The patient was advised to call immediately if she has any concerning symptoms in the interval. I spent 10 minutes counseling the patient face to face. The total time spent in the appointment was 15 minutes.  Disclaimer: This note was dictated with voice recognition software. Similar sounding words can inadvertently be transcribed and may not be corrected upon review.

## 2018-08-14 NOTE — Addendum Note (Signed)
Addended by: Lucile Crater on: 08/14/2018 10:15 AM   Modules accepted: Orders

## 2018-08-14 NOTE — Telephone Encounter (Signed)
Printed avs and calender of upcoming appointment. Per 12/4 los

## 2018-08-16 DIAGNOSIS — D0471 Carcinoma in situ of skin of right lower limb, including hip: Secondary | ICD-10-CM | POA: Diagnosis not present

## 2018-08-16 DIAGNOSIS — Z85828 Personal history of other malignant neoplasm of skin: Secondary | ICD-10-CM | POA: Diagnosis not present

## 2018-10-04 DIAGNOSIS — C3490 Malignant neoplasm of unspecified part of unspecified bronchus or lung: Secondary | ICD-10-CM | POA: Diagnosis not present

## 2018-10-04 DIAGNOSIS — F0390 Unspecified dementia without behavioral disturbance: Secondary | ICD-10-CM | POA: Diagnosis not present

## 2018-10-04 DIAGNOSIS — Z111 Encounter for screening for respiratory tuberculosis: Secondary | ICD-10-CM | POA: Diagnosis not present

## 2018-10-04 DIAGNOSIS — Z6826 Body mass index (BMI) 26.0-26.9, adult: Secondary | ICD-10-CM | POA: Diagnosis not present

## 2018-10-04 DIAGNOSIS — M1990 Unspecified osteoarthritis, unspecified site: Secondary | ICD-10-CM | POA: Diagnosis not present

## 2018-11-01 ENCOUNTER — Other Ambulatory Visit: Payer: Self-pay

## 2018-11-01 MED ORDER — ROSUVASTATIN CALCIUM 40 MG PO TABS: 20.0000 mg | ORAL_TABLET | Freq: Every day | ORAL | 1 refills | Status: DC

## 2018-11-04 DIAGNOSIS — D0472 Carcinoma in situ of skin of left lower limb, including hip: Secondary | ICD-10-CM | POA: Diagnosis not present

## 2018-11-04 DIAGNOSIS — Z85828 Personal history of other malignant neoplasm of skin: Secondary | ICD-10-CM | POA: Diagnosis not present

## 2018-11-04 DIAGNOSIS — D485 Neoplasm of uncertain behavior of skin: Secondary | ICD-10-CM | POA: Diagnosis not present

## 2018-11-04 DIAGNOSIS — L821 Other seborrheic keratosis: Secondary | ICD-10-CM | POA: Diagnosis not present

## 2018-11-04 DIAGNOSIS — L814 Other melanin hyperpigmentation: Secondary | ICD-10-CM | POA: Diagnosis not present

## 2018-11-04 DIAGNOSIS — L570 Actinic keratosis: Secondary | ICD-10-CM | POA: Diagnosis not present

## 2019-02-05 DIAGNOSIS — R112 Nausea with vomiting, unspecified: Secondary | ICD-10-CM | POA: Diagnosis not present

## 2019-02-05 DIAGNOSIS — F0390 Unspecified dementia without behavioral disturbance: Secondary | ICD-10-CM | POA: Diagnosis not present

## 2019-02-05 DIAGNOSIS — R5383 Other fatigue: Secondary | ICD-10-CM | POA: Diagnosis not present

## 2019-02-05 DIAGNOSIS — C3490 Malignant neoplasm of unspecified part of unspecified bronchus or lung: Secondary | ICD-10-CM | POA: Diagnosis not present

## 2019-02-06 DIAGNOSIS — R8290 Unspecified abnormal findings in urine: Secondary | ICD-10-CM | POA: Diagnosis not present

## 2019-02-06 DIAGNOSIS — N390 Urinary tract infection, site not specified: Secondary | ICD-10-CM | POA: Diagnosis not present

## 2019-02-13 ENCOUNTER — Ambulatory Visit (HOSPITAL_COMMUNITY)
Admission: RE | Admit: 2019-02-13 | Discharge: 2019-02-13 | Disposition: A | Discharge status: Home / Self Care | Payer: Medicare Other | Source: Ambulatory Visit | Attending: Internal Medicine | Admitting: Internal Medicine

## 2019-02-13 ENCOUNTER — Other Ambulatory Visit: Payer: Self-pay

## 2019-02-13 ENCOUNTER — Inpatient Hospital Stay: Payer: Medicare Other | Attending: Internal Medicine

## 2019-02-13 DIAGNOSIS — Z79899 Other long term (current) drug therapy: Secondary | ICD-10-CM | POA: Diagnosis not present

## 2019-02-13 DIAGNOSIS — J439 Emphysema, unspecified: Secondary | ICD-10-CM | POA: Diagnosis not present

## 2019-02-13 DIAGNOSIS — Z7982 Long term (current) use of aspirin: Secondary | ICD-10-CM | POA: Diagnosis not present

## 2019-02-13 DIAGNOSIS — Z923 Personal history of irradiation: Secondary | ICD-10-CM | POA: Insufficient documentation

## 2019-02-13 DIAGNOSIS — Z9221 Personal history of antineoplastic chemotherapy: Secondary | ICD-10-CM | POA: Diagnosis not present

## 2019-02-13 DIAGNOSIS — C3490 Malignant neoplasm of unspecified part of unspecified bronchus or lung: Secondary | ICD-10-CM

## 2019-02-13 DIAGNOSIS — Z85118 Personal history of other malignant neoplasm of bronchus and lung: Secondary | ICD-10-CM | POA: Insufficient documentation

## 2019-02-13 LAB — CBC WITH DIFFERENTIAL (CANCER CENTER ONLY)
Abs Immature Granulocytes: 0.02 10*3/uL (ref 0.00–0.07)
Basophils Absolute: 0 10*3/uL (ref 0.0–0.1)
Basophils Relative: 1 %
Eosinophils Absolute: 0.1 10*3/uL (ref 0.0–0.5)
Eosinophils Relative: 1 %
HCT: 34.8 % — ABNORMAL LOW (ref 36.0–46.0)
Hemoglobin: 11.1 g/dL — ABNORMAL LOW (ref 12.0–15.0)
Immature Granulocytes: 0 %
Lymphocytes Relative: 12 %
Lymphs Abs: 0.6 10*3/uL — ABNORMAL LOW (ref 0.7–4.0)
MCH: 29.7 pg (ref 26.0–34.0)
MCHC: 31.9 g/dL (ref 30.0–36.0)
MCV: 93 fL (ref 80.0–100.0)
Monocytes Absolute: 0.6 10*3/uL (ref 0.1–1.0)
Monocytes Relative: 11 %
Neutro Abs: 3.9 10*3/uL (ref 1.7–7.7)
Neutrophils Relative %: 75 %
Platelet Count: 167 10*3/uL (ref 150–400)
RBC: 3.74 MIL/uL — ABNORMAL LOW (ref 3.87–5.11)
RDW: 15.7 % — ABNORMAL HIGH (ref 11.5–15.5)
WBC Count: 5.2 10*3/uL (ref 4.0–10.5)
nRBC: 0 % (ref 0.0–0.2)

## 2019-02-13 LAB — CMP (CANCER CENTER ONLY)
ALT: 7 U/L (ref 0–44)
AST: 15 U/L (ref 15–41)
Albumin: 3.5 g/dL (ref 3.5–5.0)
Alkaline Phosphatase: 71 U/L (ref 38–126)
Anion gap: 10 (ref 5–15)
BUN: 8 mg/dL (ref 8–23)
CO2: 28 mmol/L (ref 22–32)
Calcium: 9.3 mg/dL (ref 8.9–10.3)
Chloride: 104 mmol/L (ref 98–111)
Creatinine: 1.02 mg/dL — ABNORMAL HIGH (ref 0.44–1.00)
GFR, Est AFR Am: 59 mL/min — ABNORMAL LOW (ref >60)
GFR, Estimated: 51 mL/min — ABNORMAL LOW (ref >60)
Glucose, Bld: 112 mg/dL — ABNORMAL HIGH (ref 70–99)
Potassium: 3.4 mmol/L — ABNORMAL LOW (ref 3.5–5.1)
Sodium: 142 mmol/L (ref 135–145)
Total Bilirubin: 0.4 mg/dL (ref 0.3–1.2)
Total Protein: 7.1 g/dL (ref 6.5–8.1)

## 2019-02-13 MED ORDER — SODIUM CHLORIDE (PF) 0.9 % IJ SOLN
INTRAMUSCULAR | Status: AC
  Filled 2019-02-13: qty 50

## 2019-02-13 MED ORDER — IOHEXOL 300 MG/ML  SOLN
75.0000 mL | Freq: Once | INTRAMUSCULAR | Status: AC | PRN
  Administered 2019-02-13: 75 mL via INTRAVENOUS

## 2019-02-14 ENCOUNTER — Telehealth: Payer: Self-pay | Admitting: *Deleted

## 2019-02-14 NOTE — Telephone Encounter (Signed)
TCT  Patient's husband as he asked one of the NT's if he could come back with patient for her visit with Dr. Julien Nordmann next week.  Spoke with husband and advised that visitor restrictions are still in place. Offered to have Dr. Julien Nordmann call him either during the visit or after the visit to review results of pt's recent scans. Elizabeth Humphrey was satisfied with this option. Phone # added to appt note for next week. Cell # [phone]

## 2019-02-17 ENCOUNTER — Other Ambulatory Visit: Payer: Self-pay

## 2019-02-17 ENCOUNTER — Inpatient Hospital Stay (HOSPITAL_BASED_OUTPATIENT_CLINIC_OR_DEPARTMENT_OTHER): Payer: Medicare Other | Admitting: Internal Medicine

## 2019-02-17 ENCOUNTER — Encounter: Payer: Self-pay | Admitting: Internal Medicine

## 2019-02-17 VITALS — BP 102/59 | HR 90 | Temp 98.5°F | Resp 18 | Ht 67.0 in | Wt 153.7 lb

## 2019-02-17 DIAGNOSIS — Z9221 Personal history of antineoplastic chemotherapy: Secondary | ICD-10-CM

## 2019-02-17 DIAGNOSIS — Z79899 Other long term (current) drug therapy: Secondary | ICD-10-CM | POA: Diagnosis not present

## 2019-02-17 DIAGNOSIS — Z7982 Long term (current) use of aspirin: Secondary | ICD-10-CM | POA: Diagnosis not present

## 2019-02-17 DIAGNOSIS — J449 Chronic obstructive pulmonary disease, unspecified: Secondary | ICD-10-CM

## 2019-02-17 DIAGNOSIS — C3490 Malignant neoplasm of unspecified part of unspecified bronchus or lung: Secondary | ICD-10-CM

## 2019-02-17 DIAGNOSIS — Z85118 Personal history of other malignant neoplasm of bronchus and lung: Secondary | ICD-10-CM | POA: Diagnosis not present

## 2019-02-17 DIAGNOSIS — Z923 Personal history of irradiation: Secondary | ICD-10-CM

## 2019-02-17 NOTE — Progress Notes (Signed)
[place]  [phone]) [phone] Fax:(336) [phone]  OFFICE PROGRESS NOTE   Burnard Bunting, MD [place] [address]  DIAGNOSIS: Small cell lung carcinoma, limited stage   Primary site: Lung (Right)   Staging method: AJCC 7th Edition   Clinical: Stage IIIA (T2a, N2, M0) signed by Curt Bears, MD on 08/06/2013  3:32 PM   Summary: Stage IIIA (T2a, N2, M0)  PRIOR THERAPY: 1) Systemic chemotherapy with carboplatin for an AUC of 5 given on day 1 and etoposide 120 mg per meter squared on days 1, 2 and 3 with Neulasta support given on day 4. This given concurrent with radiation. Status post 4 cycles 2) she declined prophylactic cranial irradiation.  CURRENT THERAPY: Observation.  INTERVAL HISTORY: Elizabeth Humphrey 85 y.o. female returns to the clinic today for follow-up visit accompanied by her husband.  The patient is feeling fine today except for the generalized fatigue and weakness as well as memory loss.  She denied having any current chest pain, shortness of breath but has mild cough with no hemoptysis.  She denied having any recent weight loss or night sweats.  She has no nausea, vomiting, diarrhea or constipation.  She is here today for evaluation with repeat CT scan of the chest for restaging of her disease.   MEDICAL HISTORY: Past Medical History:  Diagnosis Date  . Allergy    bee stings/anaphylaxis  . Arthritis   . COPD (chronic obstructive pulmonary disease) ([place]) 07/30/13  . Cough   . Dementia ([place])   . Heart murmur   . History of pneumonia   . History of radiation therapy 08/26/13-10/20/13   59.4 gray to central chest area  . Hyperlipidemia    hx of  . Lung cancer ([place]) dx'd 07/2013  . Pyelonephritis     ALLERGIES:  is allergic to neosporin [neomycin-bacitracin zn-polymyx].  MEDICATIONS:  Current Outpatient Medications  Medication Sig Dispense Refill  . acetaminophen (TYLENOL) 325 MG tablet Take 650 mg by mouth every 6  (six) hours as needed (pain).    [name] aspirin EC 81 MG tablet Take 81 mg by mouth at bedtime.    . cholecalciferol (VITAMIN D) 1000 units tablet Take 1,000 Units by mouth daily.    [name] donepezil (ARICEPT) 10 MG tablet     . hydrocortisone (ANUSOL-HC) 2.5 % rectal cream     . hydrocortisone (ANUSOL-HC) 25 MG suppository Place 1 suppository (25 mg total) rectally 2 (two) times daily. For 7 days 15 suppository 0  . ibuprofen (ADVIL,MOTRIN) 200 MG tablet Take 400 mg by mouth every 6 (six) hours as needed for headache or moderate pain. Reported on 01/31/2016    . memantine (NAMENDA) 10 MG tablet Take 10 mg by mouth daily.    . rosuvastatin (CRESTOR) 40 MG tablet Take 0.5 tablets (20 mg total) by mouth daily. 30 tablet 1  . senna-docusate (SENOKOT-S) 8.6-50 MG tablet Take 1 tablet by mouth daily. 30 tablet 0  . simvastatin (ZOCOR) 40 MG tablet Take 40 mg by mouth daily.     Current Facility-Administered Medications  Medication Dose Route Frequency Provider Last Rate Last Dose  . 0.9 %  sodium chloride infusion  500 mL Intravenous Once Ladene Artist, MD        SURGICAL HISTORY:  Past Surgical History:  Procedure Laterality Date  . CATARACT EXTRACTION     both eyes  . COLONOSCOPY    . DILATION AND CURETTAGE OF UTERUS  yrs ago  . TONSILLECTOMY    .  VIDEO BRONCHOSCOPY WITH ENDOBRONCHIAL ULTRASOUND N/A 08/04/2013   Procedure: VIDEO BRONCHOSCOPY WITH ENDOBRONCHIAL ULTRASOUND;  Surgeon: Melrose Nakayama, MD;  Location: [facility] OR;  Service: Thoracic;  Laterality: N/A;    REVIEW OF SYSTEMS:  A comprehensive review of systems was negative except for: Constitutional: positive for fatigue Respiratory: positive for dyspnea on exertion Neurological: positive for memory problems   PHYSICAL EXAMINATION: General appearance: alert, cooperative, appears stated age and no distress Head: Normocephalic, without obvious abnormality, atraumatic Neck: no adenopathy, no carotid bruit, no JVD, supple, symmetrical,  trachea midline and thyroid not enlarged, symmetric, no tenderness/mass/nodules Lymph nodes: Cervical, supraclavicular, and axillary nodes normal. Resp: clear to auscultation bilaterally Back: symmetric, no curvature. ROM normal. No CVA tenderness. Cardio: regular rate and rhythm, S1, S2 normal, no murmur, click, rub or gallop GI: soft, non-tender; bowel sounds normal; no masses,  no organomegaly Extremities: extremities normal, atraumatic, no cyanosis or edema  ECOG PERFORMANCE STATUS: 1 - Symptomatic but completely ambulatory  Blood pressure (!) 102/59, pulse 90, temperature 98.5 F (36.9 C), temperature source Oral, resp. rate 18, height 5\' 7"  (1.702 m), weight 153 lb 11.2 oz (69.7 kg), SpO2 99 %.  LABORATORY DATA: Lab Results  Component Value Date   WBC 5.2 02/13/2019   HGB 11.1 (L) 02/13/2019   HCT 34.8 (L) 02/13/2019   MCV 93.0 02/13/2019   PLT 167 02/13/2019      Chemistry      Component Value Date/Time   NA 142 02/13/2019 0944   NA 141 07/30/2017 1106   K 3.4 (L) 02/13/2019 0944   K 4.5 07/30/2017 1106   CL 104 02/13/2019 0944   CO2 28 02/13/2019 0944   CO2 26 07/30/2017 1106   BUN 8 02/13/2019 0944   BUN 16.6 07/30/2017 1106   CREATININE 1.02 (H) 02/13/2019 0944   CREATININE 0.8 07/30/2017 1106      Component Value Date/Time   CALCIUM 9.3 02/13/2019 0944   CALCIUM 9.5 07/30/2017 1106   ALKPHOS 71 02/13/2019 0944   ALKPHOS 59 07/30/2017 1106   AST 15 02/13/2019 0944   AST 17 07/30/2017 1106   ALT 7 02/13/2019 0944   ALT 15 07/30/2017 1106   BILITOT 0.4 02/13/2019 0944   BILITOT 0.49 07/30/2017 1106       RADIOGRAPHIC STUDIES: Ct Chest W Contrast  Result Date: 02/13/2019 CLINICAL DATA:  Lung cancer since 2014 status post chemotherapy and XRT in 2015. Follow-up exam. EXAM: CT CHEST WITH CONTRAST TECHNIQUE: Multidetector CT imaging of the chest was performed during intravenous contrast administration. CONTRAST:  72mL OMNIPAQUE IOHEXOL 300 MG/ML  SOLN  COMPARISON:  08/13/2018 FINDINGS: Cardiovascular: The heart size appears within normal limits. No pericardial effusion identified. There is aortic atherosclerosis. Coronary artery calcifications. Mediastinum/Nodes: The Normal appearance of the thyroid gland. The trachea appears patent and is midline. Normal appearance of the esophagus. No mediastinal or hilar adenopathy. No axillary or supraclavicular adenopathy. Lungs/Pleura: Emphysema. Small loculated right pleural effusion appears similar. Paramediastinal masslike architectural distortion and fibrosis is identified within the right upper lung. The appearance is stable when compared with the previous exam. New from previous exam are multiple peripheral pulmonary nodules within the right lower with a clustered distribution, image 107/5. These nodules measure up to 1.2 cm. Favor inflammatory or infectious process. Previous round solid nodule within the left lower lobe measures 5 mm and appears unchanged, image 87/5. Adjacent round solid nodule is also unchanged measuring 4 mm, image 92/5. Upper Abdomen: No acute abnormality. Musculoskeletal: Spondylosis identified within  the thoracic spine. No aggressive lytic or sclerotic bone lesions identified. Chronic healed rib deformity is identified on the right, image 94/2. IMPRESSION: 1. Stable appearance of radiation changes in the right upper hemithorax. No highly concerning findings identified to suggest recurrent tumor or metastatic disease. 2. Multiple clustered areas of ill-defined nodules in the right lower lobe are new from previous exam and are favored to represent sequelae of inflammatory or infectious process. Consider short-term interval follow-up in 3 months with repeat CT of the chest to ensure resolution 3. Stable small round solid nodules in the left lower lobe identified on previous exam. 4. Aortic Atherosclerosis (ICD10-I70.0) and Emphysema (ICD10-J43.9). Coronary artery calcifications. Electronically  Signed   By: Kerby Moors M.D.   On: 02/13/2019 14:45    ASSESSMENT/PLAN:  This is a very pleasant 85 years old white female with limited stage small cell lung cancer diagnosed in November 2014. The patient is currently on observation and she is doing fine except for the memory loss. She had repeat CT scan of the chest performed recently.  I personally and independently reviewed the scans and discussed the result with the patient and her husband. Her scan showed no concerning findings for disease progression. I recommended for her to continue on observation with repeat CT scan of the chest in 6 months. She was advised to call immediately if she has any concerning symptoms in the interval. She status post systemic chemotherapy with carboplatin and etoposide concurrent with radiation and she declined prophylactic cranial irradiation.  I spent 10 minutes counseling the patient face to face. The total time spent in the appointment was 15 minutes.  Disclaimer: This note was dictated with voice recognition software. Similar sounding words can inadvertently be transcribed and may not be corrected upon review.

## 2019-02-18 ENCOUNTER — Telehealth: Payer: Self-pay | Admitting: Internal Medicine

## 2019-02-18 NOTE — Telephone Encounter (Signed)
Scheduled appt per 6/08 los - mailed reminder letter with appt date and time

## 2019-02-28 DIAGNOSIS — Z85828 Personal history of other malignant neoplasm of skin: Secondary | ICD-10-CM | POA: Diagnosis not present

## 2019-02-28 DIAGNOSIS — L821 Other seborrheic keratosis: Secondary | ICD-10-CM | POA: Diagnosis not present

## 2019-02-28 DIAGNOSIS — C44729 Squamous cell carcinoma of skin of left lower limb, including hip: Secondary | ICD-10-CM | POA: Diagnosis not present

## 2019-02-28 DIAGNOSIS — C44719 Basal cell carcinoma of skin of left lower limb, including hip: Secondary | ICD-10-CM | POA: Diagnosis not present

## 2019-02-28 DIAGNOSIS — L570 Actinic keratosis: Secondary | ICD-10-CM | POA: Diagnosis not present

## 2019-03-24 DIAGNOSIS — Z85828 Personal history of other malignant neoplasm of skin: Secondary | ICD-10-CM | POA: Diagnosis not present

## 2019-03-24 DIAGNOSIS — C44729 Squamous cell carcinoma of skin of left lower limb, including hip: Secondary | ICD-10-CM | POA: Diagnosis not present

## 2019-05-14 DIAGNOSIS — Z23 Encounter for immunization: Secondary | ICD-10-CM | POA: Diagnosis not present

## 2019-05-14 DIAGNOSIS — R82998 Other abnormal findings in urine: Secondary | ICD-10-CM | POA: Diagnosis not present

## 2019-05-14 DIAGNOSIS — Z0000 Encounter for general adult medical examination without abnormal findings: Secondary | ICD-10-CM | POA: Diagnosis not present

## 2019-05-14 DIAGNOSIS — R011 Cardiac murmur, unspecified: Secondary | ICD-10-CM | POA: Diagnosis not present

## 2019-05-14 DIAGNOSIS — R7301 Impaired fasting glucose: Secondary | ICD-10-CM | POA: Diagnosis not present

## 2019-05-14 DIAGNOSIS — E7849 Other hyperlipidemia: Secondary | ICD-10-CM | POA: Diagnosis not present

## 2019-05-21 DIAGNOSIS — N63 Unspecified lump in unspecified breast: Secondary | ICD-10-CM | POA: Diagnosis not present

## 2019-05-21 DIAGNOSIS — R7301 Impaired fasting glucose: Secondary | ICD-10-CM | POA: Diagnosis not present

## 2019-05-21 DIAGNOSIS — Z0000 Encounter for general adult medical examination without abnormal findings: Secondary | ICD-10-CM | POA: Diagnosis not present

## 2019-05-21 DIAGNOSIS — M545 Low back pain: Secondary | ICD-10-CM | POA: Diagnosis not present

## 2019-05-21 DIAGNOSIS — C3490 Malignant neoplasm of unspecified part of unspecified bronchus or lung: Secondary | ICD-10-CM | POA: Diagnosis not present

## 2019-05-21 DIAGNOSIS — R600 Localized edema: Secondary | ICD-10-CM | POA: Diagnosis not present

## 2019-05-21 DIAGNOSIS — E785 Hyperlipidemia, unspecified: Secondary | ICD-10-CM | POA: Diagnosis not present

## 2019-05-21 DIAGNOSIS — F0390 Unspecified dementia without behavioral disturbance: Secondary | ICD-10-CM | POA: Diagnosis not present

## 2019-05-21 DIAGNOSIS — M5416 Radiculopathy, lumbar region: Secondary | ICD-10-CM | POA: Diagnosis not present

## 2019-05-21 DIAGNOSIS — R011 Cardiac murmur, unspecified: Secondary | ICD-10-CM | POA: Diagnosis not present

## 2019-05-21 DIAGNOSIS — R918 Other nonspecific abnormal finding of lung field: Secondary | ICD-10-CM | POA: Diagnosis not present

## 2019-05-21 DIAGNOSIS — M1990 Unspecified osteoarthritis, unspecified site: Secondary | ICD-10-CM | POA: Diagnosis not present

## 2019-06-13 DIAGNOSIS — L821 Other seborrheic keratosis: Secondary | ICD-10-CM | POA: Diagnosis not present

## 2019-06-13 DIAGNOSIS — Z85828 Personal history of other malignant neoplasm of skin: Secondary | ICD-10-CM | POA: Diagnosis not present

## 2019-08-18 ENCOUNTER — Ambulatory Visit (HOSPITAL_COMMUNITY)
Admission: RE | Admit: 2019-08-18 | Discharge: 2019-08-18 | Disposition: A | Discharge status: Home / Self Care | Payer: Medicare Other | Source: Ambulatory Visit | Attending: Internal Medicine | Admitting: Internal Medicine

## 2019-08-18 ENCOUNTER — Encounter (HOSPITAL_COMMUNITY): Payer: Self-pay

## 2019-08-18 ENCOUNTER — Inpatient Hospital Stay: Payer: Medicare Other | Attending: Internal Medicine

## 2019-08-18 ENCOUNTER — Other Ambulatory Visit: Payer: Self-pay

## 2019-08-18 DIAGNOSIS — C3490 Malignant neoplasm of unspecified part of unspecified bronchus or lung: Secondary | ICD-10-CM | POA: Insufficient documentation

## 2019-08-18 DIAGNOSIS — R413 Other amnesia: Secondary | ICD-10-CM | POA: Diagnosis not present

## 2019-08-18 DIAGNOSIS — Z79899 Other long term (current) drug therapy: Secondary | ICD-10-CM | POA: Diagnosis not present

## 2019-08-18 LAB — CMP (CANCER CENTER ONLY)
ALT: 9 U/L (ref 0–44)
AST: 15 U/L (ref 15–41)
Albumin: 3.5 g/dL (ref 3.5–5.0)
Alkaline Phosphatase: 59 U/L (ref 38–126)
Anion gap: 7 (ref 5–15)
BUN: 12 mg/dL (ref 8–23)
CO2: 28 mmol/L (ref 22–32)
Calcium: 8.7 mg/dL — ABNORMAL LOW (ref 8.9–10.3)
Chloride: 105 mmol/L (ref 98–111)
Creatinine: 0.8 mg/dL (ref 0.44–1.00)
GFR, Est AFR Am: >60 mL/min (ref >60)
GFR, Estimated: >60 mL/min (ref >60)
Glucose, Bld: 104 mg/dL — ABNORMAL HIGH (ref 70–99)
Potassium: 3.8 mmol/L (ref 3.5–5.1)
Sodium: 140 mmol/L (ref 135–145)
Total Bilirubin: 0.3 mg/dL (ref 0.3–1.2)
Total Protein: 6.7 g/dL (ref 6.5–8.1)

## 2019-08-18 LAB — CBC WITH DIFFERENTIAL (CANCER CENTER ONLY)
Abs Immature Granulocytes: 0.01 10*3/uL (ref 0.00–0.07)
Basophils Absolute: 0 10*3/uL (ref 0.0–0.1)
Basophils Relative: 0 %
Eosinophils Absolute: 0.1 10*3/uL (ref 0.0–0.5)
Eosinophils Relative: 3 %
HCT: 31 % — ABNORMAL LOW (ref 36.0–46.0)
Hemoglobin: 10.1 g/dL — ABNORMAL LOW (ref 12.0–15.0)
Immature Granulocytes: 0 %
Lymphocytes Relative: 14 %
Lymphs Abs: 0.7 10*3/uL (ref 0.7–4.0)
MCH: 30.2 pg (ref 26.0–34.0)
MCHC: 32.6 g/dL (ref 30.0–36.0)
MCV: 92.8 fL (ref 80.0–100.0)
Monocytes Absolute: 0.5 10*3/uL (ref 0.1–1.0)
Monocytes Relative: 11 %
Neutro Abs: 3.3 10*3/uL (ref 1.7–7.7)
Neutrophils Relative %: 72 %
Platelet Count: 129 10*3/uL — ABNORMAL LOW (ref 150–400)
RBC: 3.34 MIL/uL — ABNORMAL LOW (ref 3.87–5.11)
RDW: 17.2 % — ABNORMAL HIGH (ref 11.5–15.5)
WBC Count: 4.7 10*3/uL (ref 4.0–10.5)
nRBC: 0 % (ref 0.0–0.2)

## 2019-08-18 MED ORDER — IOHEXOL 300 MG/ML  SOLN
75.0000 mL | Freq: Once | INTRAMUSCULAR | Status: AC | PRN
  Administered 2019-08-18: 75 mL via INTRAVENOUS

## 2019-08-18 MED ORDER — SODIUM CHLORIDE (PF) 0.9 % IJ SOLN
INTRAMUSCULAR | Status: AC
  Filled 2019-08-18: qty 50

## 2019-08-20 ENCOUNTER — Encounter: Payer: Self-pay | Admitting: *Deleted

## 2019-08-20 ENCOUNTER — Inpatient Hospital Stay (HOSPITAL_BASED_OUTPATIENT_CLINIC_OR_DEPARTMENT_OTHER): Payer: Medicare Other | Admitting: Internal Medicine

## 2019-08-20 ENCOUNTER — Other Ambulatory Visit: Payer: Self-pay

## 2019-08-20 ENCOUNTER — Encounter: Payer: Self-pay | Admitting: Internal Medicine

## 2019-08-20 VITALS — BP 106/76 | HR 79 | Temp 98.5°F | Resp 18 | Wt 157.4 lb

## 2019-08-20 DIAGNOSIS — C3490 Malignant neoplasm of unspecified part of unspecified bronchus or lung: Secondary | ICD-10-CM

## 2019-08-20 NOTE — Progress Notes (Signed)
[place]  [phone] Fax:(336) [phone]  OFFICE PROGRESS NOTE   Burnard Bunting, MD [place] [address]  DIAGNOSIS: Small cell lung carcinoma, limited stage   Primary site: Lung (Right)   Staging method: AJCC 7th Edition   Clinical: Stage IIIA (T2a, N2, M0) signed by Curt Bears, MD on 08/06/2013  3:32 PM   Summary: Stage IIIA (T2a, N2, M0)  PRIOR THERAPY: 1) Systemic chemotherapy with carboplatin for an AUC of 5 given on day 1 and etoposide 120 mg per meter squared on days 1, 2 and 3 with Neulasta support given on day 4. This given concurrent with radiation. Status post 4 cycles 2) she declined prophylactic cranial irradiation.  CURRENT THERAPY: Observation.  INTERVAL HISTORY: Elizabeth Humphrey 85 y.o. female returns to the clinic today for follow-up visit accompanied by her husband.  The patient is feeling fine today with no concerning complaints except for the dementia.  She denied having any current chest pain, shortness of breath, cough or hemoptysis.  She denied having any fever or chills.  She has no nausea, vomiting, diarrhea or constipation.  She has no headache or visual changes.  She is here today for evaluation with repeat CT scan of the chest for restaging of her disease.   MEDICAL HISTORY: Past Medical History:  Diagnosis Date  . Allergy    bee stings/anaphylaxis  . Arthritis   . COPD (chronic obstructive pulmonary disease) ([place]) 07/30/13  . Cough   . Dementia ([place])   . Heart murmur   . History of pneumonia   . History of radiation therapy 08/26/13-10/20/13   59.4 gray to central chest area  . Hyperlipidemia    hx of  . Lung cancer ([place]) dx'd 07/2013  . Pyelonephritis     ALLERGIES:  is allergic to neosporin [neomycin-bacitracin zn-polymyx].  MEDICATIONS:  Current Outpatient Medications  Medication Sig Dispense Refill  . cholecalciferol (VITAMIN D) 1000 units tablet Take 1,000 Units by mouth daily.     [name] donepezil (ARICEPT) 10 MG tablet     . memantine (NAMENDA) 10 MG tablet Take 10 mg by mouth daily.    [name] senna-docusate (SENOKOT-S) 8.6-50 MG tablet Take 1 tablet by mouth daily. 30 tablet 0  . simvastatin (ZOCOR) 40 MG tablet Take 40 mg by mouth daily.     Current Facility-Administered Medications  Medication Dose Route Frequency Provider Last Rate Last Dose  . 0.9 %  sodium chloride infusion  500 mL Intravenous Once Ladene Artist, MD        SURGICAL HISTORY:  Past Surgical History:  Procedure Laterality Date  . CATARACT EXTRACTION     both eyes  . COLONOSCOPY    . DILATION AND CURETTAGE OF UTERUS  yrs ago  . TONSILLECTOMY    . VIDEO BRONCHOSCOPY WITH ENDOBRONCHIAL ULTRASOUND N/A 08/04/2013   Procedure: VIDEO BRONCHOSCOPY WITH ENDOBRONCHIAL ULTRASOUND;  Surgeon: Melrose Nakayama, MD;  Location: [facility] OR;  Service: Thoracic;  Laterality: N/A;    REVIEW OF SYSTEMS:  A comprehensive review of systems was negative except for: Constitutional: positive for fatigue Neurological: positive for memory problems   PHYSICAL EXAMINATION: General appearance: alert, cooperative, appears stated age and no distress Head: Normocephalic, without obvious abnormality, atraumatic Neck: no adenopathy, no carotid bruit, no JVD, supple, symmetrical, trachea midline and thyroid not enlarged, symmetric, no tenderness/mass/nodules Lymph nodes: Cervical, supraclavicular, and axillary nodes normal. Resp: clear to auscultation bilaterally Back: symmetric, no curvature. ROM normal. No CVA tenderness. Cardio:  regular rate and rhythm, S1, S2 normal, no murmur, click, rub or gallop GI: soft, non-tender; bowel sounds normal; no masses,  no organomegaly Extremities: extremities normal, atraumatic, no cyanosis or edema  ECOG PERFORMANCE STATUS: 1 - Symptomatic but completely ambulatory  Blood pressure 106/76, pulse 79, temperature 98.5 F (36.9 C), temperature source Oral, resp. rate 18, weight 157 lb 6.4 oz  (71.4 kg), SpO2 98 %.  LABORATORY DATA: Lab Results  Component Value Date   WBC 4.7 08/18/2019   HGB 10.1 (L) 08/18/2019   HCT 31.0 (L) 08/18/2019   MCV 92.8 08/18/2019   PLT 129 (L) 08/18/2019      Chemistry      Component Value Date/Time   NA 140 08/18/2019 1141   NA 141 07/30/2017 1106   K 3.8 08/18/2019 1141   K 4.5 07/30/2017 1106   CL 105 08/18/2019 1141   CO2 28 08/18/2019 1141   CO2 26 07/30/2017 1106   BUN 12 08/18/2019 1141   BUN 16.6 07/30/2017 1106   CREATININE 0.80 08/18/2019 1141   CREATININE 0.8 07/30/2017 1106      Component Value Date/Time   CALCIUM 8.7 (L) 08/18/2019 1141   CALCIUM 9.5 07/30/2017 1106   ALKPHOS 59 08/18/2019 1141   ALKPHOS 59 07/30/2017 1106   AST 15 08/18/2019 1141   AST 17 07/30/2017 1106   ALT 9 08/18/2019 1141   ALT 15 07/30/2017 1106   BILITOT 0.3 08/18/2019 1141   BILITOT 0.49 07/30/2017 1106       RADIOGRAPHIC STUDIES: Ct Chest W Contrast  Result Date: 08/18/2019 CLINICAL DATA:  Restaging small cell lung cancer. EXAM: CT CHEST WITH CONTRAST TECHNIQUE: Multidetector CT imaging of the chest was performed during intravenous contrast administration. CONTRAST:  4mL OMNIPAQUE IOHEXOL 300 MG/ML  SOLN COMPARISON:  Multiple previous chest CTs. The most recent are 08/13/2018 and 02/13/2019 FINDINGS: Cardiovascular: The heart is normal in size. No pericardial effusion. Stable tortuosity and advanced atherosclerotic calcifications involving the aorta and branch vessels including three-vessel coronary artery calcifications. No focal aneurysm or dissection. Mediastinum/Nodes: Stable right hilar and paramediastinal soft tissue density consistent with radiation change. Stable subcarinal soft tissue density, also likely radiation change. I do not see any new or progressive mediastinal or hilar lymph nodes to suggest recurrent tumor. The esophagus is grossly normal. Lungs/Pleura: Stable significant underlying emphysematous changes and areas of  pulmonary scarring. Extensive radiation changes involving the right lung with dense right apical pleural and parenchymal scarring and extensive right paramediastinal pulmonary fibrosis. Interval resolution of the right lower lobe inflammatory process with inflammatory nodularity. Stable small sub solid left lower lobe pulmonary nodules. No pleural effusions or worrisome pulmonary lesions. Upper Abdomen: No significant upper abdominal findings. Stable aortic and branch vessel calcifications. Musculoskeletal: Stable T9 and T11 compression fractures. No new fractures or worrisome bone lesions. IMPRESSION: 1. Stable extensive radiation changes involving the right paramediastinal lung, right hilum and mediastinum. No findings suspicious for recurrent tumor. 2. Resolution of right lower lobe inflammatory nodularity. Stable small sub solid nodules at the left lung base. 3. Stable emphysematous changes and pulmonary scarring. 4. Stable advanced atherosclerotic calcifications involving the aorta and coronary arteries. Aortic Atherosclerosis (ICD10-I70.0) and Emphysema (ICD10-J43.9). Electronically Signed   By: Marijo Sanes M.D.   On: 08/18/2019 14:59    ASSESSMENT/PLAN:  This is a very pleasant 85  years old white female with limited stage small cell lung cancer diagnosed in November 2014. She status post systemic chemotherapy with carboplatin and etoposide concurrent with  radiation and she declined prophylactic cranial irradiation. The patient is currently on observation and she is doing fine except for the memory loss. The patient has no concerning complaints today. She had repeat CT scan of the chest performed recently.  I personally and independently reviewed the scans and discussed the results with the patient and her husband. Her scan showed no concerning findings for disease recurrence or metastasis. I recommended for her to continue on observation with repeat CT scan of the chest in 1 year. The patient  was advised to call immediately if she has any concerning symptoms in the interval. I spent 10 minutes counseling the patient face to face. The total time spent in the appointment was 15 minutes.  Disclaimer: This note was dictated with voice recognition software. Similar sounding words can inadvertently be transcribed and may not be corrected upon review.

## 2019-08-20 NOTE — Progress Notes (Signed)
Oncology Nurse Navigator Documentation  Oncology Nurse Navigator Flowsheets 08/20/2019  Navigator Location [facility]  Navigator Encounter Type Clinic/MDC/I spoke with patient and husband today at clinic.  [name] is suffering with memory issues.  Patient husband with her and states patient will be going to a memory care unit at [facility].  He understands plan of care with follow up in one year with scan.    Patient Visit Type MedOnc  Treatment Phase Follow-up  Barriers/Navigation Needs Education  Education Other  Interventions Education  Acuity Level 2-Minimal Needs (1-2 Barriers Identified)  Time Spent with Patient 15

## 2019-08-21 ENCOUNTER — Telehealth: Payer: Self-pay | Admitting: Internal Medicine

## 2019-08-21 NOTE — Telephone Encounter (Signed)
Scheduled per los. Mailed printout  °

## 2019-09-29 DIAGNOSIS — C44729 Squamous cell carcinoma of skin of left lower limb, including hip: Secondary | ICD-10-CM | POA: Diagnosis not present

## 2019-09-29 DIAGNOSIS — D225 Melanocytic nevi of trunk: Secondary | ICD-10-CM | POA: Diagnosis not present

## 2019-09-29 DIAGNOSIS — L570 Actinic keratosis: Secondary | ICD-10-CM | POA: Diagnosis not present

## 2019-09-29 DIAGNOSIS — C44319 Basal cell carcinoma of skin of other parts of face: Secondary | ICD-10-CM | POA: Diagnosis not present

## 2019-09-29 DIAGNOSIS — L814 Other melanin hyperpigmentation: Secondary | ICD-10-CM | POA: Diagnosis not present

## 2019-09-29 DIAGNOSIS — C44622 Squamous cell carcinoma of skin of right upper limb, including shoulder: Secondary | ICD-10-CM | POA: Diagnosis not present

## 2019-09-29 DIAGNOSIS — L821 Other seborrheic keratosis: Secondary | ICD-10-CM | POA: Diagnosis not present

## 2019-09-29 DIAGNOSIS — Z85828 Personal history of other malignant neoplasm of skin: Secondary | ICD-10-CM | POA: Diagnosis not present

## 2019-09-29 DIAGNOSIS — C44629 Squamous cell carcinoma of skin of left upper limb, including shoulder: Secondary | ICD-10-CM | POA: Diagnosis not present

## 2019-10-06 DIAGNOSIS — Z85828 Personal history of other malignant neoplasm of skin: Secondary | ICD-10-CM | POA: Diagnosis not present

## 2019-10-06 DIAGNOSIS — C44729 Squamous cell carcinoma of skin of left lower limb, including hip: Secondary | ICD-10-CM | POA: Diagnosis not present

## 2019-10-07 DIAGNOSIS — Z85828 Personal history of other malignant neoplasm of skin: Secondary | ICD-10-CM | POA: Diagnosis not present

## 2019-10-07 DIAGNOSIS — C44729 Squamous cell carcinoma of skin of left lower limb, including hip: Secondary | ICD-10-CM | POA: Diagnosis not present

## 2019-10-13 DIAGNOSIS — C3490 Malignant neoplasm of unspecified part of unspecified bronchus or lung: Secondary | ICD-10-CM | POA: Diagnosis not present

## 2019-10-13 DIAGNOSIS — F0390 Unspecified dementia without behavioral disturbance: Secondary | ICD-10-CM | POA: Diagnosis not present

## 2019-10-13 DIAGNOSIS — K5900 Constipation, unspecified: Secondary | ICD-10-CM | POA: Diagnosis not present

## 2019-10-13 DIAGNOSIS — E785 Hyperlipidemia, unspecified: Secondary | ICD-10-CM | POA: Diagnosis not present

## 2019-10-27 DIAGNOSIS — K5900 Constipation, unspecified: Secondary | ICD-10-CM | POA: Diagnosis not present

## 2019-10-27 DIAGNOSIS — G301 Alzheimer's disease with late onset: Secondary | ICD-10-CM | POA: Diagnosis not present

## 2019-10-27 DIAGNOSIS — E785 Hyperlipidemia, unspecified: Secondary | ICD-10-CM | POA: Diagnosis not present

## 2019-10-27 DIAGNOSIS — F0280 Dementia in other diseases classified elsewhere without behavioral disturbance: Secondary | ICD-10-CM | POA: Diagnosis not present

## 2019-10-27 DIAGNOSIS — C3490 Malignant neoplasm of unspecified part of unspecified bronchus or lung: Secondary | ICD-10-CM | POA: Diagnosis not present

## 2019-11-04 DIAGNOSIS — F0390 Unspecified dementia without behavioral disturbance: Secondary | ICD-10-CM | POA: Diagnosis not present

## 2019-11-04 DIAGNOSIS — H1031 Unspecified acute conjunctivitis, right eye: Secondary | ICD-10-CM | POA: Diagnosis not present

## 2019-11-11 DIAGNOSIS — G4700 Insomnia, unspecified: Secondary | ICD-10-CM | POA: Diagnosis not present

## 2019-11-11 DIAGNOSIS — F0391 Unspecified dementia with behavioral disturbance: Secondary | ICD-10-CM | POA: Diagnosis not present

## 2019-12-02 DIAGNOSIS — F0391 Unspecified dementia with behavioral disturbance: Secondary | ICD-10-CM | POA: Diagnosis not present

## 2019-12-02 DIAGNOSIS — G4700 Insomnia, unspecified: Secondary | ICD-10-CM | POA: Diagnosis not present

## 2019-12-16 DIAGNOSIS — F0391 Unspecified dementia with behavioral disturbance: Secondary | ICD-10-CM | POA: Diagnosis not present

## 2019-12-16 DIAGNOSIS — G4700 Insomnia, unspecified: Secondary | ICD-10-CM | POA: Diagnosis not present

## 2019-12-23 DIAGNOSIS — G4700 Insomnia, unspecified: Secondary | ICD-10-CM | POA: Diagnosis not present

## 2019-12-23 DIAGNOSIS — F419 Anxiety disorder, unspecified: Secondary | ICD-10-CM | POA: Diagnosis not present

## 2019-12-23 DIAGNOSIS — F0391 Unspecified dementia with behavioral disturbance: Secondary | ICD-10-CM | POA: Diagnosis not present

## 2020-01-06 DIAGNOSIS — G4700 Insomnia, unspecified: Secondary | ICD-10-CM | POA: Diagnosis not present

## 2020-01-06 DIAGNOSIS — F0391 Unspecified dementia with behavioral disturbance: Secondary | ICD-10-CM | POA: Diagnosis not present

## 2020-01-06 DIAGNOSIS — F419 Anxiety disorder, unspecified: Secondary | ICD-10-CM | POA: Diagnosis not present

## 2020-04-14 DIAGNOSIS — L989 Disorder of the skin and subcutaneous tissue, unspecified: Secondary | ICD-10-CM | POA: Diagnosis not present

## 2020-05-05 DIAGNOSIS — E7800 Pure hypercholesterolemia, unspecified: Secondary | ICD-10-CM | POA: Diagnosis not present

## 2020-05-14 DIAGNOSIS — Z9181 History of falling: Secondary | ICD-10-CM | POA: Diagnosis not present

## 2020-05-14 DIAGNOSIS — M25552 Pain in left hip: Secondary | ICD-10-CM | POA: Diagnosis not present

## 2020-05-16 DIAGNOSIS — M79662 Pain in left lower leg: Secondary | ICD-10-CM | POA: Diagnosis not present

## 2020-05-16 DIAGNOSIS — Z9181 History of falling: Secondary | ICD-10-CM | POA: Diagnosis not present

## 2020-05-16 DIAGNOSIS — M79652 Pain in left thigh: Secondary | ICD-10-CM | POA: Diagnosis not present

## 2020-05-18 DIAGNOSIS — C44729 Squamous cell carcinoma of skin of left lower limb, including hip: Secondary | ICD-10-CM | POA: Diagnosis not present

## 2020-05-18 DIAGNOSIS — D485 Neoplasm of uncertain behavior of skin: Secondary | ICD-10-CM | POA: Diagnosis not present

## 2020-05-18 DIAGNOSIS — L814 Other melanin hyperpigmentation: Secondary | ICD-10-CM | POA: Diagnosis not present

## 2020-05-18 DIAGNOSIS — L570 Actinic keratosis: Secondary | ICD-10-CM | POA: Diagnosis not present

## 2020-05-18 DIAGNOSIS — L821 Other seborrheic keratosis: Secondary | ICD-10-CM | POA: Diagnosis not present

## 2020-05-18 DIAGNOSIS — D692 Other nonthrombocytopenic purpura: Secondary | ICD-10-CM | POA: Diagnosis not present

## 2020-05-24 DIAGNOSIS — R05 Cough: Secondary | ICD-10-CM | POA: Diagnosis not present

## 2020-05-26 DIAGNOSIS — M79662 Pain in left lower leg: Secondary | ICD-10-CM | POA: Diagnosis not present

## 2020-05-26 DIAGNOSIS — M79661 Pain in right lower leg: Secondary | ICD-10-CM | POA: Diagnosis not present

## 2020-05-26 DIAGNOSIS — M25559 Pain in unspecified hip: Secondary | ICD-10-CM | POA: Diagnosis not present

## 2020-05-26 DIAGNOSIS — M5137 Other intervertebral disc degeneration, lumbosacral region: Secondary | ICD-10-CM | POA: Diagnosis not present

## 2020-05-26 DIAGNOSIS — R2689 Other abnormalities of gait and mobility: Secondary | ICD-10-CM | POA: Diagnosis not present

## 2020-05-26 DIAGNOSIS — Z9181 History of falling: Secondary | ICD-10-CM | POA: Diagnosis not present

## 2020-05-26 DIAGNOSIS — M79605 Pain in left leg: Secondary | ICD-10-CM | POA: Diagnosis not present

## 2020-05-26 DIAGNOSIS — M6281 Muscle weakness (generalized): Secondary | ICD-10-CM | POA: Diagnosis not present

## 2020-05-26 DIAGNOSIS — R2681 Unsteadiness on feet: Secondary | ICD-10-CM | POA: Diagnosis not present

## 2020-05-27 DIAGNOSIS — G309 Alzheimer's disease, unspecified: Secondary | ICD-10-CM | POA: Diagnosis not present

## 2020-05-27 DIAGNOSIS — Z789 Other specified health status: Secondary | ICD-10-CM | POA: Diagnosis not present

## 2020-05-27 DIAGNOSIS — Z7409 Other reduced mobility: Secondary | ICD-10-CM | POA: Diagnosis not present

## 2020-05-27 DIAGNOSIS — M79605 Pain in left leg: Secondary | ICD-10-CM | POA: Diagnosis not present

## 2020-05-27 DIAGNOSIS — F0280 Dementia in other diseases classified elsewhere without behavioral disturbance: Secondary | ICD-10-CM | POA: Diagnosis not present

## 2020-06-02 DIAGNOSIS — C44729 Squamous cell carcinoma of skin of left lower limb, including hip: Secondary | ICD-10-CM | POA: Diagnosis not present

## 2020-06-02 DIAGNOSIS — Z85828 Personal history of other malignant neoplasm of skin: Secondary | ICD-10-CM | POA: Diagnosis not present

## 2020-06-15 DIAGNOSIS — C44729 Squamous cell carcinoma of skin of left lower limb, including hip: Secondary | ICD-10-CM | POA: Diagnosis not present

## 2020-06-15 DIAGNOSIS — F411 Generalized anxiety disorder: Secondary | ICD-10-CM | POA: Diagnosis not present

## 2020-06-15 DIAGNOSIS — D692 Other nonthrombocytopenic purpura: Secondary | ICD-10-CM | POA: Diagnosis not present

## 2020-06-15 DIAGNOSIS — L570 Actinic keratosis: Secondary | ICD-10-CM | POA: Diagnosis not present

## 2020-06-15 DIAGNOSIS — L814 Other melanin hyperpigmentation: Secondary | ICD-10-CM | POA: Diagnosis not present

## 2020-06-15 DIAGNOSIS — G4700 Insomnia, unspecified: Secondary | ICD-10-CM | POA: Diagnosis not present

## 2020-06-15 DIAGNOSIS — F0391 Unspecified dementia with behavioral disturbance: Secondary | ICD-10-CM | POA: Diagnosis not present

## 2020-06-17 DIAGNOSIS — M79605 Pain in left leg: Secondary | ICD-10-CM | POA: Diagnosis not present

## 2020-06-17 DIAGNOSIS — Z9181 History of falling: Secondary | ICD-10-CM | POA: Diagnosis not present

## 2020-06-17 DIAGNOSIS — M6281 Muscle weakness (generalized): Secondary | ICD-10-CM | POA: Diagnosis not present

## 2020-06-17 DIAGNOSIS — R2689 Other abnormalities of gait and mobility: Secondary | ICD-10-CM | POA: Diagnosis not present

## 2020-06-17 DIAGNOSIS — R2681 Unsteadiness on feet: Secondary | ICD-10-CM | POA: Diagnosis not present

## 2020-06-21 DIAGNOSIS — M6281 Muscle weakness (generalized): Secondary | ICD-10-CM | POA: Diagnosis not present

## 2020-06-21 DIAGNOSIS — M79605 Pain in left leg: Secondary | ICD-10-CM | POA: Diagnosis not present

## 2020-06-21 DIAGNOSIS — R2689 Other abnormalities of gait and mobility: Secondary | ICD-10-CM | POA: Diagnosis not present

## 2020-06-21 DIAGNOSIS — R2681 Unsteadiness on feet: Secondary | ICD-10-CM | POA: Diagnosis not present

## 2020-06-21 DIAGNOSIS — Z9181 History of falling: Secondary | ICD-10-CM | POA: Diagnosis not present

## 2020-06-22 DIAGNOSIS — R2681 Unsteadiness on feet: Secondary | ICD-10-CM | POA: Diagnosis not present

## 2020-06-22 DIAGNOSIS — R2689 Other abnormalities of gait and mobility: Secondary | ICD-10-CM | POA: Diagnosis not present

## 2020-06-22 DIAGNOSIS — M6281 Muscle weakness (generalized): Secondary | ICD-10-CM | POA: Diagnosis not present

## 2020-06-22 DIAGNOSIS — Z9181 History of falling: Secondary | ICD-10-CM | POA: Diagnosis not present

## 2020-06-22 DIAGNOSIS — M79605 Pain in left leg: Secondary | ICD-10-CM | POA: Diagnosis not present

## 2020-06-23 DIAGNOSIS — R2689 Other abnormalities of gait and mobility: Secondary | ICD-10-CM | POA: Diagnosis not present

## 2020-06-23 DIAGNOSIS — M6281 Muscle weakness (generalized): Secondary | ICD-10-CM | POA: Diagnosis not present

## 2020-06-23 DIAGNOSIS — R2681 Unsteadiness on feet: Secondary | ICD-10-CM | POA: Diagnosis not present

## 2020-06-23 DIAGNOSIS — M79605 Pain in left leg: Secondary | ICD-10-CM | POA: Diagnosis not present

## 2020-06-23 DIAGNOSIS — Z9181 History of falling: Secondary | ICD-10-CM | POA: Diagnosis not present

## 2020-06-25 DIAGNOSIS — M79605 Pain in left leg: Secondary | ICD-10-CM | POA: Diagnosis not present

## 2020-06-25 DIAGNOSIS — M6281 Muscle weakness (generalized): Secondary | ICD-10-CM | POA: Diagnosis not present

## 2020-06-25 DIAGNOSIS — R2681 Unsteadiness on feet: Secondary | ICD-10-CM | POA: Diagnosis not present

## 2020-06-25 DIAGNOSIS — Z9181 History of falling: Secondary | ICD-10-CM | POA: Diagnosis not present

## 2020-06-25 DIAGNOSIS — R2689 Other abnormalities of gait and mobility: Secondary | ICD-10-CM | POA: Diagnosis not present

## 2020-06-28 DIAGNOSIS — Z9181 History of falling: Secondary | ICD-10-CM | POA: Diagnosis not present

## 2020-06-28 DIAGNOSIS — M6281 Muscle weakness (generalized): Secondary | ICD-10-CM | POA: Diagnosis not present

## 2020-06-28 DIAGNOSIS — R2689 Other abnormalities of gait and mobility: Secondary | ICD-10-CM | POA: Diagnosis not present

## 2020-06-28 DIAGNOSIS — R2681 Unsteadiness on feet: Secondary | ICD-10-CM | POA: Diagnosis not present

## 2020-06-28 DIAGNOSIS — M79605 Pain in left leg: Secondary | ICD-10-CM | POA: Diagnosis not present

## 2020-06-29 DIAGNOSIS — M6281 Muscle weakness (generalized): Secondary | ICD-10-CM | POA: Diagnosis not present

## 2020-06-29 DIAGNOSIS — Z9181 History of falling: Secondary | ICD-10-CM | POA: Diagnosis not present

## 2020-06-29 DIAGNOSIS — M79605 Pain in left leg: Secondary | ICD-10-CM | POA: Diagnosis not present

## 2020-06-29 DIAGNOSIS — R2689 Other abnormalities of gait and mobility: Secondary | ICD-10-CM | POA: Diagnosis not present

## 2020-06-29 DIAGNOSIS — R2681 Unsteadiness on feet: Secondary | ICD-10-CM | POA: Diagnosis not present

## 2020-07-02 DIAGNOSIS — D62 Acute posthemorrhagic anemia: Secondary | ICD-10-CM | POA: Diagnosis not present

## 2020-07-02 DIAGNOSIS — D6959 Other secondary thrombocytopenia: Secondary | ICD-10-CM | POA: Diagnosis not present

## 2020-07-02 DIAGNOSIS — M25552 Pain in left hip: Secondary | ICD-10-CM | POA: Diagnosis not present

## 2020-07-02 DIAGNOSIS — R2681 Unsteadiness on feet: Secondary | ICD-10-CM | POA: Diagnosis not present

## 2020-07-02 DIAGNOSIS — W19XXXA Unspecified fall, initial encounter: Secondary | ICD-10-CM | POA: Diagnosis not present

## 2020-07-02 DIAGNOSIS — D0220 Carcinoma in situ of unspecified bronchus and lung: Secondary | ICD-10-CM | POA: Diagnosis not present

## 2020-07-02 DIAGNOSIS — Y9301 Activity, walking, marching and hiking: Secondary | ICD-10-CM | POA: Diagnosis not present

## 2020-07-02 DIAGNOSIS — E7849 Other hyperlipidemia: Secondary | ICD-10-CM | POA: Diagnosis not present

## 2020-07-02 DIAGNOSIS — Z043 Encounter for examination and observation following other accident: Secondary | ICD-10-CM | POA: Diagnosis not present

## 2020-07-02 DIAGNOSIS — S7222XA Displaced subtrochanteric fracture of left femur, initial encounter for closed fracture: Secondary | ICD-10-CM | POA: Diagnosis not present

## 2020-07-02 DIAGNOSIS — F0390 Unspecified dementia without behavioral disturbance: Secondary | ICD-10-CM | POA: Diagnosis not present

## 2020-07-02 DIAGNOSIS — Z85118 Personal history of other malignant neoplasm of bronchus and lung: Secondary | ICD-10-CM | POA: Diagnosis not present

## 2020-07-02 DIAGNOSIS — R52 Pain, unspecified: Secondary | ICD-10-CM | POA: Diagnosis not present

## 2020-07-02 DIAGNOSIS — E440 Moderate protein-calorie malnutrition: Secondary | ICD-10-CM | POA: Diagnosis not present

## 2020-07-02 DIAGNOSIS — Z87891 Personal history of nicotine dependence: Secondary | ICD-10-CM | POA: Diagnosis not present

## 2020-07-02 DIAGNOSIS — R279 Unspecified lack of coordination: Secondary | ICD-10-CM | POA: Diagnosis not present

## 2020-07-02 DIAGNOSIS — Z743 Need for continuous supervision: Secondary | ICD-10-CM | POA: Diagnosis not present

## 2020-07-02 DIAGNOSIS — E785 Hyperlipidemia, unspecified: Secondary | ICD-10-CM | POA: Diagnosis present

## 2020-07-02 DIAGNOSIS — R404 Transient alteration of awareness: Secondary | ICD-10-CM | POA: Diagnosis not present

## 2020-07-02 DIAGNOSIS — S72142D Displaced intertrochanteric fracture of left femur, subsequent encounter for closed fracture with routine healing: Secondary | ICD-10-CM | POA: Diagnosis not present

## 2020-07-02 DIAGNOSIS — Z20822 Contact with and (suspected) exposure to covid-19: Secondary | ICD-10-CM | POA: Diagnosis not present

## 2020-07-02 DIAGNOSIS — F0150 Vascular dementia without behavioral disturbance: Secondary | ICD-10-CM | POA: Diagnosis not present

## 2020-07-02 DIAGNOSIS — H9190 Unspecified hearing loss, unspecified ear: Secondary | ICD-10-CM | POA: Diagnosis present

## 2020-07-02 DIAGNOSIS — S72002A Fracture of unspecified part of neck of left femur, initial encounter for closed fracture: Secondary | ICD-10-CM | POA: Diagnosis not present

## 2020-07-02 DIAGNOSIS — R2689 Other abnormalities of gait and mobility: Secondary | ICD-10-CM | POA: Diagnosis not present

## 2020-07-02 DIAGNOSIS — I700 Atherosclerosis of aorta: Secondary | ICD-10-CM | POA: Diagnosis not present

## 2020-07-02 DIAGNOSIS — R000 Tachycardia, unspecified: Secondary | ICD-10-CM | POA: Diagnosis not present

## 2020-07-02 DIAGNOSIS — Y92129 Unspecified place in nursing home as the place of occurrence of the external cause: Secondary | ICD-10-CM | POA: Diagnosis not present

## 2020-07-02 DIAGNOSIS — S7292XA Unspecified fracture of left femur, initial encounter for closed fracture: Secondary | ICD-10-CM | POA: Diagnosis not present

## 2020-07-02 DIAGNOSIS — I10 Essential (primary) hypertension: Secondary | ICD-10-CM | POA: Diagnosis not present

## 2020-07-02 DIAGNOSIS — I959 Hypotension, unspecified: Secondary | ICD-10-CM | POA: Diagnosis not present

## 2020-07-02 DIAGNOSIS — J449 Chronic obstructive pulmonary disease, unspecified: Secondary | ICD-10-CM | POA: Diagnosis not present

## 2020-07-02 DIAGNOSIS — S0083XA Contusion of other part of head, initial encounter: Secondary | ICD-10-CM | POA: Diagnosis not present

## 2020-07-02 DIAGNOSIS — E559 Vitamin D deficiency, unspecified: Secondary | ICD-10-CM | POA: Diagnosis not present

## 2020-07-02 DIAGNOSIS — S0003XA Contusion of scalp, initial encounter: Secondary | ICD-10-CM | POA: Diagnosis not present

## 2020-07-02 DIAGNOSIS — S72142A Displaced intertrochanteric fracture of left femur, initial encounter for closed fracture: Secondary | ICD-10-CM | POA: Diagnosis not present

## 2020-07-06 DIAGNOSIS — R531 Weakness: Secondary | ICD-10-CM | POA: Diagnosis not present

## 2020-07-06 DIAGNOSIS — D0220 Carcinoma in situ of unspecified bronchus and lung: Secondary | ICD-10-CM | POA: Diagnosis not present

## 2020-07-06 DIAGNOSIS — S72002A Fracture of unspecified part of neck of left femur, initial encounter for closed fracture: Secondary | ICD-10-CM | POA: Diagnosis not present

## 2020-07-06 DIAGNOSIS — E440 Moderate protein-calorie malnutrition: Secondary | ICD-10-CM | POA: Diagnosis not present

## 2020-07-06 DIAGNOSIS — Z9181 History of falling: Secondary | ICD-10-CM | POA: Diagnosis not present

## 2020-07-06 DIAGNOSIS — E7849 Other hyperlipidemia: Secondary | ICD-10-CM | POA: Diagnosis not present

## 2020-07-06 DIAGNOSIS — F0280 Dementia in other diseases classified elsewhere without behavioral disturbance: Secondary | ICD-10-CM | POA: Diagnosis not present

## 2020-07-06 DIAGNOSIS — R2681 Unsteadiness on feet: Secondary | ICD-10-CM | POA: Diagnosis not present

## 2020-07-06 DIAGNOSIS — G301 Alzheimer's disease with late onset: Secondary | ICD-10-CM | POA: Diagnosis not present

## 2020-07-06 DIAGNOSIS — I10 Essential (primary) hypertension: Secondary | ICD-10-CM | POA: Diagnosis not present

## 2020-07-06 DIAGNOSIS — S72002D Fracture of unspecified part of neck of left femur, subsequent encounter for closed fracture with routine healing: Secondary | ICD-10-CM | POA: Diagnosis not present

## 2020-07-06 DIAGNOSIS — R279 Unspecified lack of coordination: Secondary | ICD-10-CM | POA: Diagnosis not present

## 2020-07-06 DIAGNOSIS — R2689 Other abnormalities of gait and mobility: Secondary | ICD-10-CM | POA: Diagnosis not present

## 2020-07-06 DIAGNOSIS — K219 Gastro-esophageal reflux disease without esophagitis: Secondary | ICD-10-CM | POA: Diagnosis not present

## 2020-07-06 DIAGNOSIS — S7292XD Unspecified fracture of left femur, subsequent encounter for closed fracture with routine healing: Secondary | ICD-10-CM | POA: Diagnosis not present

## 2020-07-06 DIAGNOSIS — E559 Vitamin D deficiency, unspecified: Secondary | ICD-10-CM | POA: Diagnosis not present

## 2020-07-06 DIAGNOSIS — F0150 Vascular dementia without behavioral disturbance: Secondary | ICD-10-CM | POA: Diagnosis not present

## 2020-07-06 DIAGNOSIS — R52 Pain, unspecified: Secondary | ICD-10-CM | POA: Diagnosis not present

## 2020-07-06 DIAGNOSIS — J449 Chronic obstructive pulmonary disease, unspecified: Secondary | ICD-10-CM | POA: Diagnosis not present

## 2020-07-06 DIAGNOSIS — F339 Major depressive disorder, recurrent, unspecified: Secondary | ICD-10-CM | POA: Diagnosis not present

## 2020-07-06 DIAGNOSIS — W19XXXD Unspecified fall, subsequent encounter: Secondary | ICD-10-CM | POA: Diagnosis not present

## 2020-07-06 DIAGNOSIS — M2550 Pain in unspecified joint: Secondary | ICD-10-CM | POA: Diagnosis not present

## 2020-07-06 DIAGNOSIS — Z7401 Bed confinement status: Secondary | ICD-10-CM | POA: Diagnosis not present

## 2020-07-06 DIAGNOSIS — M6281 Muscle weakness (generalized): Secondary | ICD-10-CM | POA: Diagnosis not present

## 2020-07-06 DIAGNOSIS — D649 Anemia, unspecified: Secondary | ICD-10-CM | POA: Diagnosis not present

## 2020-07-06 DIAGNOSIS — R404 Transient alteration of awareness: Secondary | ICD-10-CM | POA: Diagnosis not present

## 2020-07-06 DIAGNOSIS — F0390 Unspecified dementia without behavioral disturbance: Secondary | ICD-10-CM | POA: Diagnosis not present

## 2020-07-06 DIAGNOSIS — S7292XA Unspecified fracture of left femur, initial encounter for closed fracture: Secondary | ICD-10-CM | POA: Diagnosis not present

## 2020-07-06 DIAGNOSIS — I959 Hypotension, unspecified: Secondary | ICD-10-CM | POA: Diagnosis not present

## 2020-07-06 DIAGNOSIS — D509 Iron deficiency anemia, unspecified: Secondary | ICD-10-CM | POA: Diagnosis not present

## 2020-07-06 DIAGNOSIS — Z743 Need for continuous supervision: Secondary | ICD-10-CM | POA: Diagnosis not present

## 2020-07-06 DIAGNOSIS — E785 Hyperlipidemia, unspecified: Secondary | ICD-10-CM | POA: Diagnosis not present

## 2020-07-07 DIAGNOSIS — E785 Hyperlipidemia, unspecified: Secondary | ICD-10-CM | POA: Diagnosis not present

## 2020-07-07 DIAGNOSIS — S72002D Fracture of unspecified part of neck of left femur, subsequent encounter for closed fracture with routine healing: Secondary | ICD-10-CM | POA: Diagnosis not present

## 2020-07-07 DIAGNOSIS — K219 Gastro-esophageal reflux disease without esophagitis: Secondary | ICD-10-CM | POA: Diagnosis not present

## 2020-07-07 DIAGNOSIS — F339 Major depressive disorder, recurrent, unspecified: Secondary | ICD-10-CM | POA: Diagnosis not present

## 2020-07-07 DIAGNOSIS — F0280 Dementia in other diseases classified elsewhere without behavioral disturbance: Secondary | ICD-10-CM | POA: Diagnosis not present

## 2020-07-07 DIAGNOSIS — D509 Iron deficiency anemia, unspecified: Secondary | ICD-10-CM | POA: Diagnosis not present

## 2020-07-07 DIAGNOSIS — G301 Alzheimer's disease with late onset: Secondary | ICD-10-CM | POA: Diagnosis not present

## 2020-07-07 DIAGNOSIS — W19XXXD Unspecified fall, subsequent encounter: Secondary | ICD-10-CM | POA: Diagnosis not present

## 2020-07-16 DIAGNOSIS — Z7401 Bed confinement status: Secondary | ICD-10-CM | POA: Diagnosis not present

## 2020-07-16 DIAGNOSIS — R52 Pain, unspecified: Secondary | ICD-10-CM | POA: Diagnosis not present

## 2020-07-16 DIAGNOSIS — M2550 Pain in unspecified joint: Secondary | ICD-10-CM | POA: Diagnosis not present

## 2020-07-16 DIAGNOSIS — R531 Weakness: Secondary | ICD-10-CM | POA: Diagnosis not present

## 2020-07-16 DIAGNOSIS — S72002A Fracture of unspecified part of neck of left femur, initial encounter for closed fracture: Secondary | ICD-10-CM | POA: Diagnosis not present

## 2020-07-28 DIAGNOSIS — D649 Anemia, unspecified: Secondary | ICD-10-CM | POA: Diagnosis not present

## 2020-07-28 DIAGNOSIS — I10 Essential (primary) hypertension: Secondary | ICD-10-CM | POA: Diagnosis not present

## 2020-07-28 DIAGNOSIS — E559 Vitamin D deficiency, unspecified: Secondary | ICD-10-CM | POA: Diagnosis not present

## 2020-07-28 DIAGNOSIS — E785 Hyperlipidemia, unspecified: Secondary | ICD-10-CM | POA: Diagnosis not present

## 2020-08-13 DIAGNOSIS — S72002A Fracture of unspecified part of neck of left femur, initial encounter for closed fracture: Secondary | ICD-10-CM | POA: Diagnosis not present

## 2020-08-16 ENCOUNTER — Inpatient Hospital Stay: Payer: Medicare Other

## 2020-08-18 ENCOUNTER — Inpatient Hospital Stay: Payer: Medicare Other | Admitting: Internal Medicine

## 2020-08-27 DIAGNOSIS — S72002D Fracture of unspecified part of neck of left femur, subsequent encounter for closed fracture with routine healing: Secondary | ICD-10-CM | POA: Diagnosis not present

## 2020-08-27 DIAGNOSIS — F339 Major depressive disorder, recurrent, unspecified: Secondary | ICD-10-CM | POA: Diagnosis not present

## 2020-08-27 DIAGNOSIS — F0280 Dementia in other diseases classified elsewhere without behavioral disturbance: Secondary | ICD-10-CM | POA: Diagnosis not present

## 2020-08-27 DIAGNOSIS — W19XXXD Unspecified fall, subsequent encounter: Secondary | ICD-10-CM | POA: Diagnosis not present

## 2020-08-27 DIAGNOSIS — G301 Alzheimer's disease with late onset: Secondary | ICD-10-CM | POA: Diagnosis not present

## 2020-10-12 DEATH — deceased

## 2020-12-10 DEATH — deceased
